# Patient Record
Sex: Male | Born: 1990 | Race: Black or African American | Hispanic: No | Marital: Single | Smoking: Current some day smoker
Health system: Southern US, Community
[De-identification: ages and names within clinical notes are randomized; demographics above are authoritative.]

## PROBLEM LIST (undated history)

## (undated) ENCOUNTER — Other Ambulatory Visit (HOSPITAL_COMMUNITY): Admission: EM | Payer: Self-pay | Source: Intra-hospital

## (undated) DIAGNOSIS — F329 Major depressive disorder, single episode, unspecified: Secondary | ICD-10-CM

## (undated) DIAGNOSIS — E119 Type 2 diabetes mellitus without complications: Secondary | ICD-10-CM

## (undated) DIAGNOSIS — Z789 Other specified health status: Secondary | ICD-10-CM

## (undated) DIAGNOSIS — F419 Anxiety disorder, unspecified: Secondary | ICD-10-CM

## (undated) DIAGNOSIS — F32A Depression, unspecified: Secondary | ICD-10-CM

## (undated) HISTORY — DX: Major depressive disorder, single episode, unspecified: F32.9

## (undated) HISTORY — DX: Depression, unspecified: F32.A

## (undated) HISTORY — PX: NO PAST SURGERIES: SHX2092

---
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
## 2012-04-05 ENCOUNTER — Encounter (HOSPITAL_COMMUNITY): Payer: Self-pay | Admitting: *Deleted

## 2012-04-05 ENCOUNTER — Emergency Department (HOSPITAL_COMMUNITY): Payer: BC Managed Care – PPO

## 2012-04-05 ENCOUNTER — Inpatient Hospital Stay (HOSPITAL_COMMUNITY): Payer: BC Managed Care – PPO | Admitting: Anesthesiology

## 2012-04-05 ENCOUNTER — Encounter (HOSPITAL_COMMUNITY): Payer: Self-pay | Admitting: Anesthesiology

## 2012-04-05 ENCOUNTER — Encounter (HOSPITAL_COMMUNITY): Admission: EM | Disposition: A | Discharge status: Home / Self Care | Payer: Self-pay | Source: Home / Self Care

## 2012-04-05 ENCOUNTER — Inpatient Hospital Stay (HOSPITAL_COMMUNITY)
Admission: EM | Admit: 2012-04-05 | Discharge: 2012-04-09 | DRG: 732 | Disposition: A | Discharge status: Home / Self Care | Payer: BC Managed Care – PPO | Attending: Orthopedic Surgery | Admitting: Orthopedic Surgery

## 2012-04-05 DIAGNOSIS — F32A Depression, unspecified: Secondary | ICD-10-CM | POA: Diagnosis present

## 2012-04-05 DIAGNOSIS — F1210 Cannabis abuse, uncomplicated: Secondary | ICD-10-CM | POA: Diagnosis present

## 2012-04-05 DIAGNOSIS — S82202B Unspecified fracture of shaft of left tibia, initial encounter for open fracture type I or II: Secondary | ICD-10-CM | POA: Diagnosis present

## 2012-04-05 DIAGNOSIS — S0292XA Unspecified fracture of facial bones, initial encounter for closed fracture: Secondary | ICD-10-CM

## 2012-04-05 DIAGNOSIS — T07XXXA Unspecified multiple injuries, initial encounter: Secondary | ICD-10-CM

## 2012-04-05 DIAGNOSIS — F329 Major depressive disorder, single episode, unspecified: Secondary | ICD-10-CM | POA: Diagnosis present

## 2012-04-05 DIAGNOSIS — Z23 Encounter for immunization: Secondary | ICD-10-CM

## 2012-04-05 DIAGNOSIS — S060X9A Concussion with loss of consciousness of unspecified duration, initial encounter: Secondary | ICD-10-CM | POA: Diagnosis present

## 2012-04-05 DIAGNOSIS — S82209B Unspecified fracture of shaft of unspecified tibia, initial encounter for open fracture type I or II: Secondary | ICD-10-CM

## 2012-04-05 DIAGNOSIS — S022XXA Fracture of nasal bones, initial encounter for closed fracture: Secondary | ICD-10-CM | POA: Diagnosis present

## 2012-04-05 DIAGNOSIS — F3289 Other specified depressive episodes: Secondary | ICD-10-CM | POA: Diagnosis present

## 2012-04-05 DIAGNOSIS — S27329A Contusion of lung, unspecified, initial encounter: Secondary | ICD-10-CM | POA: Diagnosis present

## 2012-04-05 DIAGNOSIS — S060XAA Concussion with loss of consciousness status unknown, initial encounter: Secondary | ICD-10-CM | POA: Diagnosis present

## 2012-04-05 DIAGNOSIS — S27322A Contusion of lung, bilateral, initial encounter: Secondary | ICD-10-CM

## 2012-04-05 DIAGNOSIS — D62 Acute posthemorrhagic anemia: Secondary | ICD-10-CM | POA: Diagnosis not present

## 2012-04-05 DIAGNOSIS — R000 Tachycardia, unspecified: Secondary | ICD-10-CM | POA: Diagnosis present

## 2012-04-05 DIAGNOSIS — F17200 Nicotine dependence, unspecified, uncomplicated: Secondary | ICD-10-CM | POA: Diagnosis present

## 2012-04-05 DIAGNOSIS — S060X0A Concussion without loss of consciousness, initial encounter: Secondary | ICD-10-CM | POA: Diagnosis present

## 2012-04-05 HISTORY — DX: Other specified health status: Z78.9

## 2012-04-05 HISTORY — PX: ORIF TIBIA FRACTURE: SHX5416

## 2012-04-05 LAB — CBC
HCT: 50.8 % (ref 39.0–52.0)
MCV: 87.4 fL (ref 78.0–100.0)
Platelets: 180 10*3/uL (ref 150–400)
RBC: 5.81 MIL/uL (ref 4.22–5.81)
WBC: 21.8 10*3/uL — ABNORMAL HIGH (ref 4.0–10.5)

## 2012-04-05 LAB — URINALYSIS, MICROSCOPIC ONLY
Bilirubin Urine: NEGATIVE
Nitrite: NEGATIVE
Specific Gravity, Urine: >1.046 — ABNORMAL HIGH (ref 1.005–1.030)
pH: 6.5 (ref 5.0–8.0)

## 2012-04-05 LAB — COMPREHENSIVE METABOLIC PANEL
Albumin: 4.4 g/dL (ref 3.5–5.2)
BUN: 13 mg/dL (ref 6–23)
Creatinine, Ser: 1.08 mg/dL (ref 0.50–1.35)
Total Protein: 7.4 g/dL (ref 6.0–8.3)

## 2012-04-05 LAB — POCT I-STAT, CHEM 8
Glucose, Bld: 126 mg/dL — ABNORMAL HIGH (ref 70–99)
HCT: 55 % — ABNORMAL HIGH (ref 39.0–52.0)
Hemoglobin: 18.7 g/dL — ABNORMAL HIGH (ref 13.0–17.0)
Potassium: 3.4 mEq/L — ABNORMAL LOW (ref 3.5–5.1)
Sodium: 144 mEq/L (ref 135–145)

## 2012-04-05 LAB — POCT I-STAT TROPONIN I: Troponin i, poc: 0.08 ng/mL (ref 0.00–0.08)

## 2012-04-05 LAB — PROTIME-INR: INR: 1.19 (ref 0.00–1.49)

## 2012-04-05 SURGERY — OPEN REDUCTION INTERNAL FIXATION (ORIF) TIBIA FRACTURE
Anesthesia: General | Site: Leg Lower | Laterality: Left | Wound class: Dirty or Infected

## 2012-04-05 MED ORDER — PROPOFOL 10 MG/ML IV EMUL
INTRAVENOUS | Status: DC | PRN
  Administered 2012-04-05: 180 mg via INTRAVENOUS

## 2012-04-05 MED ORDER — MIDAZOLAM HCL 5 MG/5ML IJ SOLN
INTRAMUSCULAR | Status: DC | PRN
  Administered 2012-04-05: 2 mg via INTRAVENOUS

## 2012-04-05 MED ORDER — ROCURONIUM BROMIDE 100 MG/10ML IV SOLN
INTRAVENOUS | Status: DC | PRN
  Administered 2012-04-05: 50 mg via INTRAVENOUS

## 2012-04-05 MED ORDER — POTASSIUM CHLORIDE IN NACL 20-0.9 MEQ/L-% IV SOLN
INTRAVENOUS | Status: DC
  Administered 2012-04-05: 20:00:00 via INTRAVENOUS
  Filled 2012-04-05 (×4): qty 1000

## 2012-04-05 MED ORDER — SODIUM CHLORIDE 0.9 % IV BOLUS (SEPSIS)
1000.0000 mL | Freq: Once | INTRAVENOUS | Status: AC
  Administered 2012-04-05: 1000 mL via INTRAVENOUS

## 2012-04-05 MED ORDER — FENTANYL CITRATE 0.05 MG/ML IJ SOLN
INTRAMUSCULAR | Status: DC | PRN
  Administered 2012-04-05 (×3): 50 ug via INTRAVENOUS

## 2012-04-05 MED ORDER — DEXAMETHASONE SODIUM PHOSPHATE 4 MG/ML IJ SOLN
INTRAMUSCULAR | Status: DC | PRN
  Administered 2012-04-05: 4 mg via INTRAVENOUS

## 2012-04-05 MED ORDER — 0.9 % SODIUM CHLORIDE (POUR BTL) OPTIME
TOPICAL | Status: DC | PRN
  Administered 2012-04-05: 1000 mL

## 2012-04-05 MED ORDER — HYDROMORPHONE HCL PF 1 MG/ML IJ SOLN: 1.0000 mg | INTRAMUSCULAR | Status: DC | PRN

## 2012-04-05 MED ORDER — CEFAZOLIN SODIUM 1-5 GM-% IV SOLN
INTRAVENOUS | Status: DC | PRN
  Administered 2012-04-05: 2 g via INTRAVENOUS

## 2012-04-05 MED ORDER — TETANUS-DIPHTH-ACELL PERTUSSIS 5-2.5-18.5 LF-MCG/0.5 IM SUSP
0.5000 mL | Freq: Once | INTRAMUSCULAR | Status: AC
  Administered 2012-04-05: 0.5 mL via INTRAMUSCULAR
  Filled 2012-04-05: qty 0.5

## 2012-04-05 MED ORDER — LACTATED RINGERS IV SOLN
INTRAVENOUS | Status: DC | PRN
  Administered 2012-04-05 (×2): via INTRAVENOUS

## 2012-04-05 MED ORDER — BIOTENE DRY MOUTH MT LIQD
15.0000 mL | Freq: Two times a day (BID) | OROMUCOSAL | Status: DC
  Administered 2012-04-06: 15 mL via OROMUCOSAL

## 2012-04-05 MED ORDER — ONDANSETRON HCL 4 MG PO TABS: 4.0000 mg | ORAL_TABLET | Freq: Four times a day (QID) | ORAL | Status: DC | PRN

## 2012-04-05 MED ORDER — ACETAMINOPHEN 10 MG/ML IV SOLN
INTRAVENOUS | Status: DC | PRN
  Administered 2012-04-05: 1000 mg via INTRAVENOUS

## 2012-04-05 MED ORDER — PANTOPRAZOLE SODIUM 40 MG IV SOLR
40.0000 mg | Freq: Every day | INTRAVENOUS | Status: DC
  Administered 2012-04-06: 40 mg via INTRAVENOUS
  Filled 2012-04-05: qty 40

## 2012-04-05 MED ORDER — ONDANSETRON HCL 4 MG/2ML IJ SOLN
4.0000 mg | Freq: Once | INTRAMUSCULAR | Status: AC
  Administered 2012-04-05: 4 mg via INTRAVENOUS

## 2012-04-05 MED ORDER — ACETAMINOPHEN 10 MG/ML IV SOLN
INTRAVENOUS | Status: AC
  Filled 2012-04-05: qty 100

## 2012-04-05 MED ORDER — LIDOCAINE HCL (CARDIAC) 20 MG/ML IV SOLN
INTRAVENOUS | Status: DC | PRN
  Administered 2012-04-05: 100 mg via INTRAVENOUS

## 2012-04-05 MED ORDER — ONDANSETRON HCL 4 MG/2ML IJ SOLN
INTRAMUSCULAR | Status: DC | PRN
  Administered 2012-04-05: 4 mg via INTRAVENOUS

## 2012-04-05 MED ORDER — CEFAZOLIN SODIUM 1-5 GM-% IV SOLN
1.0000 g | Freq: Once | INTRAVENOUS | Status: AC
  Administered 2012-04-05: 1 g via INTRAVENOUS
  Filled 2012-04-05: qty 50

## 2012-04-05 MED ORDER — HYDROMORPHONE HCL PF 1 MG/ML IJ SOLN
1.0000 mg | Freq: Once | INTRAMUSCULAR | Status: AC
  Administered 2012-04-05: 1 mg via INTRAVENOUS
  Filled 2012-04-05: qty 1

## 2012-04-05 MED ORDER — GLYCOPYRROLATE 0.2 MG/ML IJ SOLN
INTRAMUSCULAR | Status: DC | PRN
  Administered 2012-04-05: 0.2 mg via INTRAVENOUS
  Administered 2012-04-05: 0.4 mg via INTRAVENOUS

## 2012-04-05 MED ORDER — NEOSTIGMINE METHYLSULFATE 1 MG/ML IJ SOLN
INTRAMUSCULAR | Status: DC | PRN
  Administered 2012-04-05: 3 mg via INTRAVENOUS

## 2012-04-05 MED ORDER — HYDROMORPHONE HCL PF 1 MG/ML IJ SOLN: 0.5000 mg | INTRAMUSCULAR | Status: DC | PRN

## 2012-04-05 MED ORDER — CEFAZOLIN SODIUM-DEXTROSE 2-3 GM-% IV SOLR
INTRAVENOUS | Status: AC
  Filled 2012-04-05: qty 100

## 2012-04-05 MED ORDER — SUCCINYLCHOLINE CHLORIDE 20 MG/ML IJ SOLN
INTRAMUSCULAR | Status: DC | PRN
  Administered 2012-04-05: 140 mg via INTRAVENOUS

## 2012-04-05 MED ORDER — SODIUM CHLORIDE 0.9 % IR SOLN
Status: DC | PRN
  Administered 2012-04-05: 3000 mL

## 2012-04-05 MED ORDER — ONDANSETRON HCL 4 MG/2ML IJ SOLN: 4.0000 mg | Freq: Four times a day (QID) | INTRAMUSCULAR | Status: DC | PRN

## 2012-04-05 MED ORDER — HYDROMORPHONE HCL PF 1 MG/ML IJ SOLN
INTRAMUSCULAR | Status: AC
  Administered 2012-04-05: 1 mg
  Filled 2012-04-05: qty 1

## 2012-04-05 MED ORDER — ONDANSETRON HCL 4 MG/2ML IJ SOLN
INTRAMUSCULAR | Status: AC
  Filled 2012-04-05: qty 2

## 2012-04-05 MED ORDER — PANTOPRAZOLE SODIUM 40 MG PO TBEC
40.0000 mg | DELAYED_RELEASE_TABLET | Freq: Every day | ORAL | Status: DC
  Administered 2012-04-07 – 2012-04-08 (×2): 40 mg via ORAL
  Filled 2012-04-05 (×2): qty 1

## 2012-04-05 MED ORDER — IOHEXOL 300 MG/ML  SOLN
100.0000 mL | Freq: Once | INTRAMUSCULAR | Status: AC | PRN
  Administered 2012-04-05: 100 mL via INTRAVENOUS

## 2012-04-05 MED ORDER — FENTANYL CITRATE 0.05 MG/ML IJ SOLN
100.0000 ug | Freq: Once | INTRAMUSCULAR | Status: AC
  Administered 2012-04-05: 100 ug via INTRAVENOUS
  Filled 2012-04-05: qty 2

## 2012-04-05 SURGICAL SUPPLY — 55 items
BANDAGE ELASTIC 4 VELCRO ST LF (GAUZE/BANDAGES/DRESSINGS) ×2 IMPLANT
BANDAGE ELASTIC 6 VELCRO ST LF (GAUZE/BANDAGES/DRESSINGS) ×2 IMPLANT
BIT DRILL 4.4 (MISCELLANEOUS) ×2 IMPLANT
BIT DRILL 6X3.8 (MISCELLANEOUS) ×2 IMPLANT
BLADE SURG 10 STRL SS (BLADE) ×2 IMPLANT
CLSR STERI-STRIP ANTIMIC 1/2X4 (GAUZE/BANDAGES/DRESSINGS) ×2 IMPLANT
COVER MAYO STAND STRL (DRAPES) ×2 IMPLANT
COVER SURGICAL LIGHT HANDLE (MISCELLANEOUS) ×2 IMPLANT
CUFF TOURNIQUET SINGLE 34IN LL (TOURNIQUET CUFF) ×2 IMPLANT
CUFF TOURNIQUET SINGLE 44IN (TOURNIQUET CUFF) ×2 IMPLANT
DRAPE C-ARM 42X72 X-RAY (DRAPES) ×2 IMPLANT
DRAPE INCISE IOBAN 66X45 STRL (DRAPES) ×2 IMPLANT
DRAPE ORTHO SPLIT 77X108 STRL (DRAPES) ×1
DRAPE SURG ORHT 6 SPLT 77X108 (DRAPES) ×1 IMPLANT
DRAPE U-SHAPE 47X51 STRL (DRAPES) ×2 IMPLANT
ELECT CAUTERY BLADE 6.4 (BLADE) ×2 IMPLANT
ELECT REM PT RETURN 9FT ADLT (ELECTROSURGICAL) ×2
ELECTRODE REM PT RTRN 9FT ADLT (ELECTROSURGICAL) ×1 IMPLANT
GLOVE BIO SURGEON STRL SZ7.5 (GLOVE) ×2 IMPLANT
GLOVE BIOGEL PI IND STRL 7.5 (GLOVE) ×2 IMPLANT
GLOVE BIOGEL PI INDICATOR 7.5 (GLOVE) ×2
GLOVE NEODERM STER SZ 7 (GLOVE) ×4 IMPLANT
GLOVE ORTHO TXT STRL SZ7.5 (GLOVE) ×2 IMPLANT
GOWN STRL NON-REIN LRG LVL3 (GOWN DISPOSABLE) ×6 IMPLANT
GUIDEPIN 3.2X17.5 THRD DISP (PIN) ×2 IMPLANT
GUIDEWIRE BALL NOSE 80CM (WIRE) ×4 IMPLANT
HANDPIECE INTERPULSE COAX TIP (DISPOSABLE) ×1
KIT BASIN OR (CUSTOM PROCEDURE TRAY) ×2 IMPLANT
KIT ROOM TURNOVER OR (KITS) ×2 IMPLANT
NAIL TIBIAL 10MMX36CM (Nail) ×2 IMPLANT
PACK ORTHO EXTREMITY (CUSTOM PROCEDURE TRAY) ×4 IMPLANT
PAD ARMBOARD 7.5X6 YLW CONV (MISCELLANEOUS) ×2 IMPLANT
PAD CAST 4YDX4 CTTN HI CHSV (CAST SUPPLIES) ×3 IMPLANT
PADDING CAST COTTON 4X4 STRL (CAST SUPPLIES) ×3
SCREW ACECAP 38MM (Screw) ×2 IMPLANT
SCREW ACECAP 44MM (Screw) ×2 IMPLANT
SCREW PROXIMAL DEPUY (Screw) ×1 IMPLANT
SCREW PRXML FT 60X5.5XNS LF (Screw) ×1 IMPLANT
SET HNDPC FAN SPRY TIP SCT (DISPOSABLE) ×1 IMPLANT
SPLINT PLASTER CAST XFAST 5X30 (CAST SUPPLIES) ×2 IMPLANT
SPLINT PLASTER XFAST SET 5X30 (CAST SUPPLIES) ×2
SPONGE GAUZE 4X4 12PLY (GAUZE/BANDAGES/DRESSINGS) ×2 IMPLANT
SPONGE LAP 18X18 X RAY DECT (DISPOSABLE) ×2 IMPLANT
STOCKINETTE TUBULAR 6 INCH (GAUZE/BANDAGES/DRESSINGS) ×2 IMPLANT
SUT ETHILON 2 0 FS 18 (SUTURE) ×2 IMPLANT
SUT MNCRL AB 3-0 PS2 18 (SUTURE) ×2 IMPLANT
SUT VIC AB 0 CT1 27 (SUTURE) ×1
SUT VIC AB 0 CT1 27XBRD ANBCTR (SUTURE) ×1 IMPLANT
SUT VIC AB 1 CT1 27 (SUTURE) ×1
SUT VIC AB 1 CT1 27XBRD ANBCTR (SUTURE) ×1 IMPLANT
SUT VIC AB 2-0 CT1 27 (SUTURE) ×2
SUT VIC AB 2-0 CT1 TAPERPNT 27 (SUTURE) ×2 IMPLANT
SYR BULB IRRIGATION 50ML (SYRINGE) ×2 IMPLANT
TOWEL OR 17X24 6PK STRL BLUE (TOWEL DISPOSABLE) ×2 IMPLANT
TOWEL OR 17X26 10 PK STRL BLUE (TOWEL DISPOSABLE) ×2 IMPLANT

## 2012-04-05 NOTE — ED Notes (Signed)
Patient alert to verbal stimuli,  Resting with eyes closed

## 2012-04-05 NOTE — ED Notes (Signed)
Family at beside. Family given emotional support. 

## 2012-04-05 NOTE — Preoperative (Signed)
Beta Blockers   Reason not to administer Beta Blockers:Not Applicable 

## 2012-04-05 NOTE — Anesthesia Preprocedure Evaluation (Addendum)
Anesthesia Evaluation  Patient identified by MRN, date of birth, ID band Patient awake    Reviewed: Allergy & Precautions, H&P , NPO status , Patient's Chart, lab work & pertinent test results  Airway Mallampati: I TM Distance: >3 FB Neck ROM: Full    Dental   Pulmonary shortness of breath, Current Smoker,  Nasal fxs pulm contusions per cxr + rhonchi         Cardiovascular Rhythm:Regular Rate:Tachycardia     Neuro/Psych  C-spine cleared    GI/Hepatic (+)     substance abuse  marijuana use,   Endo/Other    Renal/GU      Musculoskeletal   Abdominal   Peds  Hematology   Anesthesia Other Findings   Reproductive/Obstetrics                          Anesthesia Physical Anesthesia Plan  ASA: II and Emergent  Anesthesia Plan: General   Post-op Pain Management:    Induction: Intravenous, Rapid sequence and Cricoid pressure planned  Airway Management Planned: Oral ETT  Additional Equipment:   Intra-op Plan:   Post-operative Plan: Extubation in OR  Informed Consent: I have reviewed the patients History and Physical, chart, labs and discussed the procedure including the risks, benefits and alternatives for the proposed anesthesia with the patient or authorized representative who has indicated his/her understanding and acceptance.     Plan Discussed with: CRNA and Surgeon  Anesthesia Plan Comments:         Anesthesia Quick Evaluation
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
## 2012-04-05 NOTE — ED Notes (Signed)
Pt in CT and x-ray.

## 2012-04-05 NOTE — ED Notes (Signed)
Family updated as to patient's status.

## 2012-04-05 NOTE — ED Notes (Signed)
Pt in CT.

## 2012-04-05 NOTE — Progress Notes (Signed)
Follow-up visit with family in waiting area.  Offered to be available as needed or requested.  Pt father thanked me for ongoing support.
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
## 2012-04-05 NOTE — Progress Notes (Signed)
Orthopedic Tech Progress Note Patient Details:  Stuart Rivers 02/20/1991 [id number]  Ortho Devices Type of Ortho Device: Short leg splint Ortho Device/Splint Interventions: Application   Cammer, Mickie Bail 04/05/2012, 1:25 PM PT LEVEL 2 TRAUMA

## 2012-04-06 ENCOUNTER — Encounter (HOSPITAL_COMMUNITY): Payer: Self-pay | Admitting: *Deleted

## 2012-04-06 ENCOUNTER — Inpatient Hospital Stay (HOSPITAL_COMMUNITY): Payer: BC Managed Care – PPO

## 2012-04-06 LAB — COMPREHENSIVE METABOLIC PANEL
BUN: 12 mg/dL (ref 6–23)
CO2: 23 mEq/L (ref 19–32)
Chloride: 108 mEq/L (ref 96–112)
Creatinine, Ser: 0.95 mg/dL (ref 0.50–1.35)
GFR calc Af Amer: >90 mL/min (ref >90)
GFR calc non Af Amer: >90 mL/min (ref >90)
Total Bilirubin: 0.5 mg/dL (ref 0.3–1.2)

## 2012-04-06 LAB — CBC
HCT: 42.6 % (ref 39.0–52.0)
MCH: 28.9 pg (ref 26.0–34.0)
MCV: 86.8 fL (ref 78.0–100.0)
RBC: 4.91 MIL/uL (ref 4.22–5.81)
WBC: 20.5 10*3/uL — ABNORMAL HIGH (ref 4.0–10.5)

## 2012-04-06 MED ORDER — PROMETHAZINE HCL 25 MG/ML IJ SOLN: 6.2500 mg | INTRAMUSCULAR | Status: DC | PRN

## 2012-04-06 MED ORDER — PHENOL 1.4 % MT LIQD: 1.0000 | OROMUCOSAL | Status: DC | PRN

## 2012-04-06 MED ORDER — WHITE PETROLATUM GEL
Status: AC
  Administered 2012-04-06: 10:00:00
  Filled 2012-04-06: qty 5

## 2012-04-06 MED ORDER — POTASSIUM CHLORIDE IN NACL 20-0.9 MEQ/L-% IV SOLN
INTRAVENOUS | Status: DC
  Administered 2012-04-06 (×2): via INTRAVENOUS
  Administered 2012-04-08: 20 mL/h via INTRAVENOUS
  Filled 2012-04-06 (×5): qty 1000

## 2012-04-06 MED ORDER — PNEUMOCOCCAL VAC POLYVALENT 25 MCG/0.5ML IJ INJ
0.5000 mL | INJECTION | INTRAMUSCULAR | Status: AC
  Administered 2012-04-07: 0.5 mL via INTRAMUSCULAR
  Filled 2012-04-06: qty 0.5

## 2012-04-06 MED ORDER — MENTHOL 3 MG MT LOZG: 1.0000 | LOZENGE | OROMUCOSAL | Status: DC | PRN

## 2012-04-06 MED ORDER — CEFAZOLIN SODIUM-DEXTROSE 2-3 GM-% IV SOLR
2.0000 g | Freq: Four times a day (QID) | INTRAVENOUS | Status: AC
  Administered 2012-04-06 – 2012-04-07 (×3): 2 g via INTRAVENOUS
  Filled 2012-04-06 (×2): qty 50

## 2012-04-06 MED ORDER — HYDROMORPHONE HCL PF 1 MG/ML IJ SOLN
0.2500 mg | INTRAMUSCULAR | Status: DC | PRN
  Administered 2012-04-06 (×2): 0.5 mg via INTRAVENOUS

## 2012-04-06 MED ORDER — METHOCARBAMOL 100 MG/ML IJ SOLN
500.0000 mg | Freq: Four times a day (QID) | INTRAVENOUS | Status: DC | PRN
  Filled 2012-04-06: qty 5

## 2012-04-06 MED ORDER — METOCLOPRAMIDE HCL 5 MG/ML IJ SOLN: 5.0000 mg | Freq: Three times a day (TID) | INTRAMUSCULAR | Status: DC | PRN

## 2012-04-06 MED ORDER — ACETAMINOPHEN 325 MG PO TABS: 650.0000 mg | ORAL_TABLET | Freq: Four times a day (QID) | ORAL | Status: DC | PRN

## 2012-04-06 MED ORDER — HYDROMORPHONE HCL PF 1 MG/ML IJ SOLN
INTRAMUSCULAR | Status: AC
  Filled 2012-04-06: qty 1

## 2012-04-06 MED ORDER — MIDAZOLAM HCL 2 MG/2ML IJ SOLN: 1.0000 mg | INTRAMUSCULAR | Status: DC | PRN

## 2012-04-06 MED ORDER — FENTANYL CITRATE 0.05 MG/ML IJ SOLN: 50.0000 ug | INTRAMUSCULAR | Status: DC | PRN

## 2012-04-06 MED ORDER — ONDANSETRON HCL 4 MG PO TABS: 4.0000 mg | ORAL_TABLET | Freq: Four times a day (QID) | ORAL | Status: DC | PRN

## 2012-04-06 MED ORDER — HYDROCODONE-ACETAMINOPHEN 5-325 MG PO TABS
1.0000 | ORAL_TABLET | Freq: Four times a day (QID) | ORAL | Status: DC | PRN
  Administered 2012-04-06 (×2): 2 via ORAL
  Filled 2012-04-06 (×3): qty 2

## 2012-04-06 MED ORDER — METHOCARBAMOL 500 MG PO TABS: 500.0000 mg | ORAL_TABLET | Freq: Four times a day (QID) | ORAL | Status: DC | PRN

## 2012-04-06 MED ORDER — MORPHINE SULFATE 2 MG/ML IJ SOLN
2.0000 mg | INTRAMUSCULAR | Status: DC | PRN
  Administered 2012-04-06 – 2012-04-07 (×7): 2 mg via INTRAVENOUS
  Administered 2012-04-07: 4 mg via INTRAVENOUS
  Administered 2012-04-07 (×2): 2 mg via INTRAVENOUS
  Administered 2012-04-07: 4 mg via INTRAVENOUS
  Administered 2012-04-07: 2 mg via INTRAVENOUS
  Administered 2012-04-07 – 2012-04-09 (×12): 4 mg via INTRAVENOUS
  Filled 2012-04-06 (×2): qty 2
  Filled 2012-04-06 (×2): qty 1
  Filled 2012-04-06 (×2): qty 2
  Filled 2012-04-06 (×3): qty 1
  Filled 2012-04-06: qty 2
  Filled 2012-04-06: qty 1
  Filled 2012-04-06: qty 2
  Filled 2012-04-06: qty 1
  Filled 2012-04-06 (×3): qty 2
  Filled 2012-04-06: qty 1
  Filled 2012-04-06 (×4): qty 2
  Filled 2012-04-06: qty 1
  Filled 2012-04-06: qty 2
  Filled 2012-04-06: qty 1

## 2012-04-06 MED ORDER — ACETAMINOPHEN 650 MG RE SUPP: 650.0000 mg | Freq: Four times a day (QID) | RECTAL | Status: DC | PRN

## 2012-04-06 MED ORDER — ONDANSETRON HCL 4 MG/2ML IJ SOLN: 4.0000 mg | Freq: Four times a day (QID) | INTRAMUSCULAR | Status: DC | PRN

## 2012-04-06 MED ORDER — METOCLOPRAMIDE HCL 5 MG PO TABS
5.0000 mg | ORAL_TABLET | Freq: Three times a day (TID) | ORAL | Status: DC | PRN
  Filled 2012-04-06: qty 2

## 2012-04-06 NOTE — Anesthesia Postprocedure Evaluation (Signed)
  Anesthesia Post-op Note  Patient: Stuart Rivers  Procedure(s) Performed: Procedure(s) (LRB): OPEN REDUCTION INTERNAL FIXATION (ORIF) TIBIA FRACTURE (Left)  Patient Location: PACU  Anesthesia Type: General  Level of Consciousness: awake  Airway and Oxygen Therapy: Patient Spontanous Breathing  Post-op Pain: mild  Post-op Assessment: Post-op Vital signs reviewed, Patient's Cardiovascular Status Stable, Respiratory Function Stable, Patent Airway, No signs of Nausea or vomiting and Pain level controlled  Post-op Vital Signs: stable  Complications: No apparent anesthesia complications
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
## 2012-04-06 NOTE — Brief Op Note (Signed)
04/05/2012 - 04/06/2012  1:55 AM  PATIENT:  Stuart Rivers  20 y.o. male  PRE-OPERATIVE DIAGNOSIS:  Grade II open left tibial shaft fracture  POST-OPERATIVE DIAGNOSIS:  Grade II open left tibial shaft fracture  PROCEDURE:  Procedure(s) (LRB): Excisional debridement of left leg wound, 4cm, skin subcutaneous tissue and bone OPEN REDUCTION INTERNAL FIXATION (ORIF) TIBIA FRACTURE (Left)  Biomet Versa nail, 10mm X 36cm, 1 proximal interlock, 2 distal interlocking screws  SURGEON:  Surgeon(s) and Role:    * Shelda Pal, MD - Primary  PHYSICIAN ASSISTANT: No  ANESTHESIA:   general  EBL:  Total I/O In: 1693.8 [I.V.:1693.8] Out: 525 [Urine:525]  BLOOD ADMINISTERED:none  DRAINS: none   LOCAL MEDICATIONS USED:  NONE  SPECIMEN:  No Specimen  DISPOSITION OF SPECIMEN:  N/A  COUNTS:  YES  TOURNIQUET:  * Missing tourniquet times found for documented tourniquets in log:  [id number] *  DICTATION: .Other Dictation: Dictation Number [id number]  PLAN OF CARE: Admit to inpatient   PATIENT DISPOSITION:  PACU - hemodynamically stable.   Delay start of Pharmacological VTE agent (>24hrs) due to surgical blood loss or risk of bleeding: yes
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
## 2012-04-06 NOTE — Progress Notes (Signed)
Chaplain was called to room by secretary of 3100.  Parents wanted to tell patient of the death of his girlfriend, but when I arrived they had changed their mind after consultation with the surgeon because of pending surgery. Instead, they asked me to pray with the patient and others gathered around the bed.  Prayer, then spiritual conversation with parents.  Will follow.  Rev. Audie Box [phone]
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
## 2012-04-06 NOTE — Progress Notes (Signed)
The patient is doing okay.  Stable for transfer to the floor.  To have his nose fixed by Dr. Kelly Splinter tomorrow.  Will transfer to floor.Marta Lamas Gae Bon, MD, FACS [phone] Trauma Surgeon

## 2012-04-06 NOTE — Progress Notes (Signed)
Subjective: 1 Day Post-Op Procedure(s) (LRB): OPEN REDUCTION INTERNAL FIXATION (ORIF) TIBIA FRACTURE (Left)    Patient reports pain as moderate. Comfortable this am with adjustment in meds. Sore all over, chest shoulder blades  Objective:   VITALS:   Filed Vitals:   04/06/12 1208  BP:   Pulse:   Temp: 97.2 F (36.2 C)  Resp:     Neurovascular intact Incision: dressing C/D/I left LE splint dry  LABS  Basename 04/06/12 0515 04/05/12 1335 04/05/12 1317  HGB 14.2 18.7* 16.9  HCT 42.6 55.0* 50.8  WBC 20.5* -- 21.8*  PLT 172 -- 180     Basename 04/06/12 0515 04/05/12 1335 04/05/12 1317  NA 139 144 143  K 4.5 3.4* 3.6  BUN 12 14 13   CREATININE 0.95 1.10 1.08  GLUCOSE 117* 126* 130*     Basename 04/05/12 1317  LABPT --  INR 1.19     Assessment/Plan: 1 Day Post-Op Procedure(s) (LRB): OPEN REDUCTION INTERNAL FIXATION (ORIF) TIBIA FRACTURE (Left)   Advance diet Up with therapy, 50% WB LLE Will continue to follow DVT prophylaxis per trauma service given any concerns about internal bleeding, lung contusion etc
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
## 2012-04-06 NOTE — Transfer of Care (Signed)
Immediate Anesthesia Transfer of Care Note  Patient: Stuart Rivers  Procedure(s) Performed: Procedure(s) (LRB): OPEN REDUCTION INTERNAL FIXATION (ORIF) TIBIA FRACTURE (Left)  Patient Location: PACU  Anesthesia Type: General  Level of Consciousness: oriented, patient cooperative and responds to stimulation  Airway & Oxygen Therapy: Patient Spontanous Breathing and Patient connected to face mask oxygen  Post-op Assessment: Report given to PACU RN, Post -op Vital signs reviewed and stable and Patient moving all extremities  Post vital signs: Reviewed and stable  Complications: No apparent anesthesia complications
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
## 2012-04-06 NOTE — Progress Notes (Signed)
UR complete 

## 2012-04-07 ENCOUNTER — Encounter (HOSPITAL_COMMUNITY): Payer: Self-pay | Admitting: Plastic Surgery

## 2012-04-07 ENCOUNTER — Encounter (HOSPITAL_COMMUNITY): Payer: Self-pay | Admitting: Anesthesiology

## 2012-04-07 ENCOUNTER — Inpatient Hospital Stay (HOSPITAL_COMMUNITY): Payer: BC Managed Care – PPO | Admitting: Anesthesiology

## 2012-04-07 ENCOUNTER — Encounter (HOSPITAL_COMMUNITY): Admission: EM | Disposition: A | Discharge status: Home / Self Care | Payer: Self-pay | Source: Home / Self Care

## 2012-04-07 DIAGNOSIS — S022XXA Fracture of nasal bones, initial encounter for closed fracture: Secondary | ICD-10-CM | POA: Diagnosis present

## 2012-04-07 HISTORY — PX: CLOSED REDUCTION NASAL FRACTURE: SHX5365

## 2012-04-07 LAB — CBC
HCT: 38.6 % — ABNORMAL LOW (ref 39.0–52.0)
Hemoglobin: 12.8 g/dL — ABNORMAL LOW (ref 13.0–17.0)
RBC: 4.4 MIL/uL (ref 4.22–5.81)
RDW: 13.7 % (ref 11.5–15.5)
WBC: 12.4 10*3/uL — ABNORMAL HIGH (ref 4.0–10.5)

## 2012-04-07 SURGERY — CLOSED REDUCTION, FRACTURE, NASAL BONE
Anesthesia: General | Site: Nose | Wound class: Clean Contaminated

## 2012-04-07 MED ORDER — FENTANYL CITRATE 0.05 MG/ML IJ SOLN
INTRAMUSCULAR | Status: DC | PRN
  Administered 2012-04-07: 50 ug via INTRAVENOUS

## 2012-04-07 MED ORDER — HYDROMORPHONE HCL PF 1 MG/ML IJ SOLN
0.2500 mg | INTRAMUSCULAR | Status: DC | PRN
  Administered 2012-04-07 (×3): 0.5 mg via INTRAVENOUS

## 2012-04-07 MED ORDER — LIDOCAINE HCL (CARDIAC) 20 MG/ML IV SOLN
INTRAVENOUS | Status: DC | PRN
  Administered 2012-04-07: 100 mg via INTRAVENOUS

## 2012-04-07 MED ORDER — LIDOCAINE-EPINEPHRINE 1 %-1:100000 IJ SOLN
INTRAMUSCULAR | Status: DC | PRN
  Administered 2012-04-07: 4 mL

## 2012-04-07 MED ORDER — MIDAZOLAM HCL 5 MG/5ML IJ SOLN
INTRAMUSCULAR | Status: DC | PRN
  Administered 2012-04-07: 2 mg via INTRAVENOUS

## 2012-04-07 MED ORDER — BACITRACIN-NEOMYCIN-POLYMYXIN OINTMENT TUBE
TOPICAL_OINTMENT | CUTANEOUS | Status: DC | PRN
  Administered 2012-04-07: 1 via TOPICAL

## 2012-04-07 MED ORDER — HYDROMORPHONE HCL PF 1 MG/ML IJ SOLN
INTRAMUSCULAR | Status: AC
  Filled 2012-04-07: qty 1

## 2012-04-07 MED ORDER — PROPOFOL 10 MG/ML IV BOLUS
INTRAVENOUS | Status: DC | PRN
  Administered 2012-04-07: 160 mg via INTRAVENOUS

## 2012-04-07 MED ORDER — DROPERIDOL 2.5 MG/ML IJ SOLN
0.6250 mg | INTRAMUSCULAR | Status: DC | PRN
  Filled 2012-04-07: qty 0.25

## 2012-04-07 MED ORDER — LACTATED RINGERS IV SOLN
INTRAVENOUS | Status: DC | PRN
  Administered 2012-04-07: 08:00:00 via INTRAVENOUS

## 2012-04-07 MED ORDER — ONDANSETRON HCL 4 MG/2ML IJ SOLN
INTRAMUSCULAR | Status: DC | PRN
  Administered 2012-04-07: 4 mg via INTRAVENOUS

## 2012-04-07 MED ORDER — OXYMETAZOLINE HCL 0.05 % NA SOLN
NASAL | Status: DC | PRN
  Administered 2012-04-07: 1 via NASAL

## 2012-04-07 SURGICAL SUPPLY — 27 items
CANISTER SUCTION 2500CC (MISCELLANEOUS) ×2 IMPLANT
CLOTH BEACON ORANGE TIMEOUT ST (SAFETY) ×2 IMPLANT
COVER MAYO STAND STRL (DRAPES) ×2 IMPLANT
COVER TABLE BACK 60X90 (DRAPES) ×2 IMPLANT
GAUZE SPONGE 2X2 8PLY STRL LF (GAUZE/BANDAGES/DRESSINGS) ×1 IMPLANT
GAUZE SPONGE 4X4 16PLY XRAY LF (GAUZE/BANDAGES/DRESSINGS) ×2 IMPLANT
GLOVE BIO SURGEON STRL SZ 6.5 (GLOVE) ×2 IMPLANT
GLOVE SURG SS PI 7.0 STRL IVOR (GLOVE) ×2 IMPLANT
GOWN STRL NON-REIN LRG LVL3 (GOWN DISPOSABLE) IMPLANT
GOWN STRL REIN XL XLG (GOWN DISPOSABLE) ×2 IMPLANT
KIT BASIN OR (CUSTOM PROCEDURE TRAY) ×2 IMPLANT
KIT ROOM TURNOVER OR (KITS) ×2 IMPLANT
KIT SPLINT NASAL DENVER PET BE (GAUZE/BANDAGES/DRESSINGS) ×2 IMPLANT
NEEDLE HYPO 25GX1X1/2 BEV (NEEDLE) ×2 IMPLANT
NS IRRIG 1000ML POUR BTL (IV SOLUTION) IMPLANT
PAD ARMBOARD 7.5X6 YLW CONV (MISCELLANEOUS) ×4 IMPLANT
PATTIES SURGICAL .5 X3 (DISPOSABLE) ×2 IMPLANT
SPLINT NASAL DOYLE BI-VL (GAUZE/BANDAGES/DRESSINGS) ×2 IMPLANT
SPLINT NASAL THERMO PLAST (MISCELLANEOUS) ×2 IMPLANT
SPONGE GAUZE 2X2 STER 10/PKG (GAUZE/BANDAGES/DRESSINGS) ×1
SPONGE GAUZE 4X4 12PLY (GAUZE/BANDAGES/DRESSINGS) ×2 IMPLANT
SUT ETHILON 3 0 PS 1 (SUTURE) ×2 IMPLANT
SYR CONTROL 10ML LL (SYRINGE) ×2 IMPLANT
TAPE CLOTH SURG 4X10 WHT LF (GAUZE/BANDAGES/DRESSINGS) ×2 IMPLANT
TOWEL OR 17X24 6PK STRL BLUE (TOWEL DISPOSABLE) ×4 IMPLANT
TUBE CONNECTING 12X1/4 (SUCTIONS) ×2 IMPLANT
WATER STERILE IRR 1000ML POUR (IV SOLUTION) ×2 IMPLANT

## 2012-04-07 NOTE — Anesthesia Postprocedure Evaluation (Signed)
  Anesthesia Post-op Note  Patient: Stuart Rivers  Procedure(s) Performed: Procedure(s) (LRB): CLOSED REDUCTION NASAL FRACTURE (N/A)  Patient Location: PACU  Anesthesia Type: General  Level of Consciousness: awake, oriented, sedated and patient cooperative  Airway and Oxygen Therapy: Room air  Post-op Pain: mild  Post-op Assessment: Post-op Vital signs reviewed, Patient's Cardiovascular Status Stable, Respiratory Function Stable, Patent Airway, No signs of Nausea or vomiting and Pain level controlled  Post-op Vital Signs: stable  Complications: No apparent anesthesia complications

## 2012-04-07 NOTE — Preoperative (Signed)
Beta Blockers   Reason not to administer Beta Blockers:Not Applicable 

## 2012-04-07 NOTE — Transfer of Care (Signed)
Immediate Anesthesia Transfer of Care Note  Patient: Stuart Rivers  Procedure(s) Performed: Procedure(s) (LRB): CLOSED REDUCTION NASAL FRACTURE (N/A)  Patient Location: PACU  Anesthesia Type: General  Level of Consciousness: awake, alert , oriented and patient cooperative  Airway & Oxygen Therapy: Patient Spontanous Breathing  Post-op Assessment: Report given to PACU RN, Post -op Vital signs reviewed and stable and Patient moving all extremities  Post vital signs: Reviewed and stable  Complications: No apparent anesthesia complications

## 2012-04-07 NOTE — Anesthesia Procedure Notes (Signed)
Procedure Name: LMA Insertion Date/Time: 04/07/2012 7:44 AM Performed by: Jerilee Hoh Pre-anesthesia Checklist: Patient identified, Emergency Drugs available, Suction available and Patient being monitored Patient Re-evaluated:Patient Re-evaluated prior to inductionOxygen Delivery Method: Circle system utilized Preoxygenation: Pre-oxygenation with 100% oxygen Intubation Type: IV induction LMA: LMA inserted LMA Size: 4.0 Tube type: Oral Number of attempts: 1 Placement Confirmation: positive ETCO2 and breath sounds checked- equal and bilateral Tube secured with: Tape Dental Injury: Teeth and Oropharynx as per pre-operative assessment and Injury to lip  Comments: Patient had dry, cracked lips prior to induction.  Small amount of blood noted on lower lip after inserting the LMA.  Lips moisturized with lacrilube.

## 2012-04-07 NOTE — Brief Op Note (Signed)
04/05/2012 - 04/07/2012  8:05 AM  PATIENT:  Stuart Rivers  20 y.o. male  PRE-OPERATIVE DIAGNOSIS:  nasal fracture  POST-OPERATIVE DIAGNOSIS:  nasal fracture  PROCEDURE:  Procedure(s) (LRB): CLOSED REDUCTION NASAL FRACTURE (N/A) WITH INTERNAL AND EXTERNAL SPLINTING  SURGEON:  Surgeon(s) and Role:    * Claire Sanger, DO - Primary  PHYSICIAN ASSISTANT: NONE  ASSISTANTS: none   ANESTHESIA:   local and general  EBL:  Total I/O In: 300 [I.V.:300] Out: 5 [Blood:5]  BLOOD ADMINISTERED:none  DRAINS: none   LOCAL MEDICATIONS USED:  LIDOCAINE   SPECIMEN:  No Specimen  DISPOSITION OF SPECIMEN:  N/A  COUNTS:  YES  TOURNIQUET:  * No tourniquets in log *  DICTATION: dictated  PLAN OF CARE: Return to unit  PATIENT DISPOSITION:  PACU - hemodynamically stable.   Delay start of Pharmacological VTE agent (>24hrs) due to surgical blood loss or risk of bleeding: no
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
## 2012-04-07 NOTE — Anesthesia Preprocedure Evaluation (Addendum)
Anesthesia Evaluation  Patient identified by MRN, date of birth, ID band Patient awake    Reviewed: Allergy & Precautions, H&P , NPO status , Patient's Chart, lab work & pertinent test results  History of Anesthesia Complications Negative for: history of anesthetic complications  Airway Mallampati: II TM Distance: >3 FB Neck ROM: Full    Dental  (+) Teeth Intact and Dental Advisory Given   Pulmonary Current Smoker,    Pulmonary exam normal       Cardiovascular negative cardio ROS  Rhythm:Regular Rate:Normal     Neuro/Psych negative neurological ROS     GI/Hepatic negative GI ROS, Neg liver ROS,   Endo/Other  negative endocrine ROS  Renal/GU negative Renal ROS     Musculoskeletal   Abdominal   Peds  Hematology   Anesthesia Other Findings   Reproductive/Obstetrics                           Anesthesia Physical Anesthesia Plan  ASA: II  Anesthesia Plan: General   Post-op Pain Management:    Induction: Intravenous  Airway Management Planned: Oral ETT  Additional Equipment:   Intra-op Plan:   Post-operative Plan: Extubation in OR  Informed Consent: I have reviewed the patients History and Physical, chart, labs and discussed the procedure including the risks, benefits and alternatives for the proposed anesthesia with the patient or authorized representative who has indicated his/her understanding and acceptance.   Dental advisory given  Plan Discussed with: CRNA, Anesthesiologist and Surgeon  Anesthesia Plan Comments:         Anesthesia Quick Evaluation

## 2012-04-07 NOTE — Progress Notes (Signed)
OT Cancellation Note  Treatment cancelled today due to medical issues with patient which prohibited therapy. Pt just returned from surgery. Will continue tomorrow.  Durante Violett, OTR/L  [phone] 04/07/2012 Ermagene Saidi,HILLARY 04/07/2012, 12:12 PM
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
## 2012-04-07 NOTE — Progress Notes (Signed)
Sore post-op. Tolerating clears GPD present obtaining blood tests I spoke to the patient's family also Patient examined and I agree with the assessment and plan  Violeta Gelinas, MD, MPH, FACS Pager: [phone]  04/07/2012 12:50 PM

## 2012-04-08 ENCOUNTER — Encounter (HOSPITAL_COMMUNITY): Payer: Self-pay | Admitting: Psychiatry

## 2012-04-08 DIAGNOSIS — Z634 Disappearance and death of family member: Secondary | ICD-10-CM

## 2012-04-08 DIAGNOSIS — S82202B Unspecified fracture of shaft of left tibia, initial encounter for open fracture type I or II: Secondary | ICD-10-CM | POA: Diagnosis present

## 2012-04-08 DIAGNOSIS — F32A Depression, unspecified: Secondary | ICD-10-CM | POA: Diagnosis present

## 2012-04-08 DIAGNOSIS — F329 Major depressive disorder, single episode, unspecified: Secondary | ICD-10-CM

## 2012-04-08 DIAGNOSIS — F3289 Other specified depressive episodes: Secondary | ICD-10-CM

## 2012-04-08 MED ORDER — ENOXAPARIN SODIUM 80 MG/0.8ML ~~LOC~~ SOLN
1.0000 mg/kg | SUBCUTANEOUS | Status: DC
  Filled 2012-04-08: qty 0.8

## 2012-04-08 MED ORDER — ENOXAPARIN SODIUM 40 MG/0.4ML ~~LOC~~ SOLN
40.0000 mg | SUBCUTANEOUS | Status: DC
  Administered 2012-04-08 – 2012-04-09 (×2): 40 mg via SUBCUTANEOUS
  Filled 2012-04-08 (×2): qty 0.4

## 2012-04-08 NOTE — Progress Notes (Signed)
This patient has been seen and I agree with the findings and treatment plan.  Netty Sullivant O. Kylea Berrong, III, MD, FACS [phone] (pager) [phone] (direct pager) Trauma Surgeon  

## 2012-04-08 NOTE — Progress Notes (Signed)
   Subjective: 1 Day Post-Op Procedure(s) (LRB): CLOSED REDUCTION NASAL FRACTURE (N/A)   Patient reports pain as mild for the tibia fracture. No events throughout the night.   Objective:   VITALS:   Filed Vitals:   04/08/12 0547  BP: 141/87  Pulse: 80  Temp: 98.1 F (36.7 C)  Resp: 17    Neurovascular intact Incision: dressing C/D/I  LABS  Basename 04/07/12 0948 04/06/12 0515 04/05/12 1335 04/05/12 1317  HGB 12.8* 14.2 18.7* --  HCT 38.6* 42.6 55.0* --  WBC 12.4* 20.5* -- 21.8*  PLT 159 172 -- 180     Basename 04/06/12 0515 04/05/12 1335 04/05/12 1317  NA 139 144 143  K 4.5 3.4* 3.6  BUN 12 14 13   CREATININE 0.95 1.10 1.08  GLUCOSE 117* 126* 130*     Assessment/Plan: 1 Day Post-Op Procedure(s) (LRB): CLOSED REDUCTION NASAL FRACTURE (N/A)   Orthopaedically stable as regards to the tibia fracture. PWB 25-50% Call with any questions Follow up in 2 weeks at [facility].  Follow-up Information    Follow up with OLIN,Khadeeja Elden D in 2 weeks.   Contact information:   [facility] [address], [address] [place] [address] [phone]             Anastasio Auerbach. Carlie Corpus   PAC  04/08/2012, 9:47 AM
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
## 2012-04-08 NOTE — Progress Notes (Signed)
Occupational Therapy Treatment Patient Details Name: ABDULAHAD MEDEROS MRN: [id number] DOB: 28-Oct-1990 Today's Date: 04/08/2012 Time: 1308-6578 OT Time Calculation (min): 29 min  OT Assessment / Plan / Recommendation Comments on Treatment Session Pt making steady progress.  Overall only needs min guard assist for mobility and simulated selfcare tasks.  Able to follow current PWBing status as well.  Anticipate need for 3:1 at discharge.  Will continue to follow.    Follow Up Recommendations  No OT follow up       Equipment Recommendations  3 in 1 bedside comode       Frequency Min 2X/week   Plan Discharge plan needs to be updated    Precautions / Restrictions Precautions Precautions: Fall Restrictions Weight Bearing Restrictions: Yes LLE Weight Bearing: Partial weight bearing LLE Partial Weight Bearing Percentage or Pounds: 25-50%   Pertinent Vitals/Pain Pain 8/10 in his nose, and 3-4/10 in his LLE   ADL  Grooming: Performed;Min guard;Wash/dry hands Where Assessed - Grooming: Supported standing Toilet Transfer: Performed;Min Pension scheme manager Method: Other (comment) (Ambulate and stand to urinate at the toilet.) Transfers/Ambulation Related to ADLs: Pt currently min guard assist for mobility using the RW.  Able to follow weightbearing precautions appropriately. ADL Comments: Pt doing much better today.  Able to transition to sitting with min assist for the LLE only.  Able to perform all sit to stand with min guard assist and min instructional cueing for hand placement.  Also needed mod instructional cueing for sequencing steps using the RW.  Discussed with pt and grandmother on tub/shower options at discharge.  Right now they are not sure about discharge plans and pt will just sponge bathe at home.       OT Goals ADL Goals ADL Goal: Toilet Transfer - Progress: Progressing toward goals  Visit Information  Last OT Received On: 04/08/12    Subjective Data  Subjective:  "I'm not sure where I will be staying when I leave here." Patient Stated Goal: did not state during session      Cognition  Overall Cognitive Status: Appears within functional limits for tasks assessed/performed Arousal/Alertness: Awake/alert Orientation Level: Appears intact for tasks assessed Behavior During Session: [facility] for tasks performed    Mobility Bed Mobility Bed Mobility: Supine to Sit Supine to Sit: 4: Min assist;HOB flat Transfers Transfers: Sit to Stand Sit to Stand: 4: Min guard;With upper extremity assist Stand to Sit: 4: Min guard;Without upper extremity assist      Balance Balance Balance Assessed: Yes Static Standing Balance Static Standing - Balance Support: Right upper extremity supported;Left upper extremity supported Static Standing - Level of Assistance: 5: Stand by assistance Dynamic Standing Balance Dynamic Standing - Balance Support: Right upper extremity supported;Left upper extremity supported Dynamic Standing - Level of Assistance: 5: Stand by assistance;Other (comment) (When using RW for mobility.)  End of Session OT - End of Session Equipment Utilized During Treatment: Gait belt Activity Tolerance: Patient tolerated treatment well Patient left: in chair;with call bell/phone within reach;with family/visitor present     [facility] OTR/L Pager number [phone] 04/08/2012, 2:31 PM

## 2012-04-08 NOTE — Progress Notes (Signed)
Patient was complaining that the dressing around his nose was dirty and would like it changed not reinforced

## 2012-04-08 NOTE — Progress Notes (Signed)
Physical Therapy Treatment Patient Details Name: Stuart Rivers MRN: [id number] DOB: 03-Dec-1990 Today's Date: 04/08/2012 Time: 1331-1400 PT Time Calculation (min): 29 min  PT Assessment / Plan / Recommendation Comments on Treatment Session  Patient progressing with ambulation this session. Patient stated he is unsure of definant discharge situation but is working to find out if he will be with his grandmother or his mother. Will attempt hall ambulation next session, if tolerates well may try stairs. Patient with good awareness of precautions.     Follow Up Recommendations  Home health PT;Supervision for mobility/OOB    Barriers to Discharge        Equipment Recommendations  3 in 1 bedside comode;Rolling walker with 5" wheels    Recommendations for Other Services    Frequency Min 3X/week   Plan Discharge plan remains appropriate;Frequency remains appropriate    Precautions / Restrictions Precautions Precautions: Fall Restrictions Weight Bearing Restrictions: Yes LLE Weight Bearing: Partial weight bearing LLE Partial Weight Bearing Percentage or Pounds: 50%   Pertinent Vitals/Pain Patient complained of sore nose. Did not rate. Increased leg pain with increased ambulation    Mobility  Bed Mobility Bed Mobility: Supine to Sit Supine to Sit: 4: Min assist;With rails Sitting - Scoot to Edge of Bed: 4: Min assist Details for Bed Mobility Assistance: A to hold LLE Transfers Sit to Stand: 4: Min guard;With upper extremity assist;From bed Stand to Sit: 4: Min guard;With upper extremity assist;To chair/3-in-1 Details for Transfer Assistance: Cues for safe hand placement Ambulation/Gait Ambulation/Gait Assistance: 4: Min guard Ambulation Distance (Feet): 30 Feet Assistive device: Rolling walker Ambulation/Gait Assistance Details: Cues for positioning and safety with RW and gait sequence Gait Pattern: Step-to pattern;Decreased step length - right;Decreased step length - left      Exercises     PT Diagnosis:    PT Problem List:   PT Treatment Interventions:     PT Goals Acute Rehab PT Goals PT Goal: Supine/Side to Sit - Progress: Progressing toward goal PT Goal: Sit to Stand - Progress: Progressing toward goal PT Transfer Goal: Bed to Chair/Chair to Bed - Progress: Progressing toward goal PT Goal: Ambulate - Progress: Progressing toward goal  Visit Information  Last PT Received On: 04/08/12 Assistance Needed: +1    Subjective Data      Cognition  Overall Cognitive Status: Appears within functional limits for tasks assessed/performed Arousal/Alertness: Awake/alert Orientation Level: Appears intact for tasks assessed Behavior During Session: [facility] for tasks performed    Balance  Balance Balance Assessed: Yes Static Standing Balance Static Standing - Balance Support: Right upper extremity supported;Left upper extremity supported Static Standing - Level of Assistance: 5: Stand by assistance Dynamic Standing Balance Dynamic Standing - Balance Support: Right upper extremity supported;Left upper extremity supported Dynamic Standing - Level of Assistance: 5: Stand by assistance;Other (comment) (When using RW for mobility.)  End of Session PT - End of Session Equipment Utilized During Treatment: Gait belt Activity Tolerance: Patient tolerated treatment well;Patient limited by pain Patient left: in chair;with call bell/phone within reach Nurse Communication: Mobility status   GP     Fredrich Birks 04/08/2012, 3:08 PM 04/08/2012 Fredrich Birks PTA [phone] pager [phone] office
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
## 2012-04-09 ENCOUNTER — Encounter (HOSPITAL_COMMUNITY): Payer: Self-pay | Admitting: Plastic Surgery

## 2012-04-09 DIAGNOSIS — S060X9A Concussion with loss of consciousness of unspecified duration, initial encounter: Secondary | ICD-10-CM | POA: Diagnosis present

## 2012-04-09 DIAGNOSIS — D62 Acute posthemorrhagic anemia: Secondary | ICD-10-CM | POA: Diagnosis not present

## 2012-04-09 DIAGNOSIS — Z7289 Other problems related to lifestyle: Secondary | ICD-10-CM | POA: Insufficient documentation

## 2012-04-09 DIAGNOSIS — Z720 Tobacco use: Secondary | ICD-10-CM | POA: Insufficient documentation

## 2012-04-09 DIAGNOSIS — F1290 Cannabis use, unspecified, uncomplicated: Secondary | ICD-10-CM | POA: Insufficient documentation

## 2012-04-09 MED ORDER — MORPHINE SULFATE 2 MG/ML IJ SOLN: 2.0000 mg | INTRAMUSCULAR | Status: DC | PRN

## 2012-04-09 MED ORDER — OXYCODONE-ACETAMINOPHEN 10-325 MG PO TABS: 1.0000 | ORAL_TABLET | Freq: Four times a day (QID) | ORAL | Status: DC | PRN

## 2012-04-09 MED ORDER — OXYCODONE HCL 5 MG PO TABS
10.0000 mg | ORAL_TABLET | ORAL | Status: DC | PRN
  Administered 2012-04-09: 20 mg via ORAL
  Filled 2012-04-09: qty 4

## 2012-04-09 MED ORDER — OXYCODONE HCL 5 MG PO TABS
10.0000 mg | ORAL_TABLET | Freq: Once | ORAL | Status: AC
  Administered 2012-04-09: 10 mg via ORAL

## 2012-04-09 MED ORDER — OXYCODONE HCL 5 MG PO TABS
5.0000 mg | ORAL_TABLET | ORAL | Status: DC | PRN
  Administered 2012-04-09: 10 mg via ORAL
  Filled 2012-04-09 (×2): qty 2

## 2012-04-09 NOTE — Progress Notes (Signed)
Pt will be discharging to [employer] [facility] home in [place].  The phone number we have is for patient's grandmother who will be the contact person for [facility] to call to arrange the visit.  Home address for services: [address] [address]--[place] address [place]  Phone number to use is:  [phone]. Grandmother's number--Bonita Lollis  HHPT arranged.

## 2012-04-09 NOTE — Progress Notes (Signed)
Physical Therapy Treatment Patient Details Name: Stuart Rivers MRN: [id number] DOB: 10-20-1990 Today's Date: 04/09/2012 Time: 5621-3086 PT Time Calculation (min): 21 min  PT Assessment / Plan / Recommendation Comments on Treatment Session  Patient continuing to make progress with ambulation and activity. Patient had gotten up and had been sitting in the chair for a while. Appears in a better emotional state and conversing a lot more this session. Patient is going to stay with his mother and will not have stairs to enter the house    Follow Up Recommendations  Home health PT;Supervision for mobility/OOB    Barriers to Discharge        Equipment Recommendations  3 in 1 bedside comode;Rolling walker with 5" wheels    Recommendations for Other Services    Frequency Min 3X/week   Plan Discharge plan remains appropriate;Frequency remains appropriate    Precautions / Restrictions Precautions Precautions: Fall Restrictions Weight Bearing Restrictions: Yes LLE Weight Bearing: Partial weight bearing LLE Partial Weight Bearing Percentage or Pounds: 50%   Pertinent Vitals/Pain 10/10 after ambulation in L leg. RN notified    Mobility  Bed Mobility Bed Mobility: Not assessed Transfers Sit to Stand: 5: Supervision;With upper extremity assist;From chair/3-in-1 Stand to Sit: With upper extremity assist;5: Supervision;To chair/3-in-1 Details for Transfer Assistance: Cues for safe hand placement Ambulation/Gait Ambulation/Gait Assistance: 4: Min guard Ambulation Distance (Feet): 60 Feet Assistive device: Rolling walker Ambulation/Gait Assistance Details: Cues for sequence and to relax shoulders Gait Pattern: Step-to pattern Gait velocity: decreased    Exercises     PT Diagnosis:    PT Problem List:   PT Treatment Interventions:     PT Goals Acute Rehab PT Goals PT Goal: Sit to Stand - Progress: Progressing toward goal PT Transfer Goal: Bed to Chair/Chair to Bed - Progress:  Progressing toward goal PT Goal: Ambulate - Progress: Progressing toward goal  Visit Information  Last PT Received On: 04/09/12 Assistance Needed: +2    Subjective Data      Cognition  Overall Cognitive Status: Appears within functional limits for tasks assessed/performed Arousal/Alertness: Awake/alert Orientation Level: Appears intact for tasks assessed Behavior During Session: [facility] for tasks performed    Balance     End of Session PT - End of Session Equipment Utilized During Treatment: Gait belt Activity Tolerance: Patient tolerated treatment well;Patient limited by pain Patient left: in chair Nurse Communication: Mobility status   GP     Fredrich Birks 04/09/2012, 11:47 AM 04/09/2012 Fredrich Birks PTA [phone] pager [phone] office
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
## 2012-04-09 NOTE — Discharge Summary (Signed)
Stuart Steinmeyer, MD, MPH, FACS Pager: [phone]  
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
## 2012-04-14 NOTE — Progress Notes (Signed)
Agree Sheana Bir, MD, MPH, FACS Pager: [phone]  

## 2012-07-06 ENCOUNTER — Other Ambulatory Visit (HOSPITAL_COMMUNITY)
Admission: RE | Admit: 2012-07-06 | Discharge: 2012-07-06 | Disposition: A | Discharge status: Home / Self Care | Payer: BC Managed Care – PPO | Source: Ambulatory Visit | Attending: Family Medicine | Admitting: Family Medicine

## 2012-07-06 ENCOUNTER — Encounter: Payer: Self-pay | Admitting: Family Medicine

## 2012-07-06 ENCOUNTER — Ambulatory Visit (INDEPENDENT_AMBULATORY_CARE_PROVIDER_SITE_OTHER): Payer: BC Managed Care – PPO | Admitting: Family Medicine

## 2012-07-06 VITALS — BP 112/70 | HR 78 | Temp 98.6°F | Ht 66.0 in | Wt 184.0 lb

## 2012-07-06 DIAGNOSIS — R300 Dysuria: Secondary | ICD-10-CM

## 2012-07-06 DIAGNOSIS — Z131 Encounter for screening for diabetes mellitus: Secondary | ICD-10-CM

## 2012-07-06 DIAGNOSIS — Z7251 High risk heterosexual behavior: Secondary | ICD-10-CM

## 2012-07-06 DIAGNOSIS — Z23 Encounter for immunization: Secondary | ICD-10-CM

## 2012-07-06 DIAGNOSIS — Z113 Encounter for screening for infections with a predominantly sexual mode of transmission: Secondary | ICD-10-CM | POA: Insufficient documentation

## 2012-07-06 DIAGNOSIS — Z13220 Encounter for screening for lipoid disorders: Secondary | ICD-10-CM

## 2012-07-06 LAB — URINALYSIS, ROUTINE W REFLEX MICROSCOPIC
Bilirubin Urine: NEGATIVE
Ketones, ur: NEGATIVE
Leukocytes, UA: NEGATIVE
Urine Glucose: NEGATIVE
Urobilinogen, UA: 0.2 (ref 0.0–1.0)

## 2012-07-06 LAB — LIPID PANEL
HDL: 34.7 mg/dL — ABNORMAL LOW (ref >39.00)
Total CHOL/HDL Ratio: 5
Triglycerides: 143 mg/dL (ref 0.0–149.0)

## 2012-07-06 LAB — HEMOGLOBIN A1C: Hgb A1c MFr Bld: 5.4 % (ref 4.6–6.5)

## 2012-07-06 MED ORDER — AZITHROMYCIN 500 MG PO TABS: 1000.0000 mg | ORAL_TABLET | Freq: Every day | ORAL | Status: DC

## 2012-07-06 MED ORDER — CEFTRIAXONE SODIUM 500 MG IJ SOLR
250.0000 mg | Freq: Once | INTRAMUSCULAR | Status: AC
  Administered 2012-07-06: 250 mg via INTRAMUSCULAR

## 2012-07-06 NOTE — Patient Instructions (Addendum)
-  We have ordered labs or studies at this visit. It can take up to 1-2 weeks for results and processing. We will contact you with instructions IF your results are abnormal. Normal results will be released to your [facility]. If you have not heard from [place] or can not find your results in [facility] in 2 weeks please contact our office.  -PLEASE SIGN UP FOR MYCHART TODAY   We recommend the following healthy lifestyle measures: - eat a healthy diet consisting of lots of vegetables, fruits, beans, nuts, seeds, healthy meats such as white chicken and fish and whole grains.  - avoid fried foods, fast food, processed foods, sodas, red meet and other fattening foods.  - get a least 150 minutes of aerobic exercise per week.   Use condoms EVERY time you have intercourse  Follow up in: as needed or if any continued symptoms
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
## 2012-07-07 LAB — HIV ANTIBODY (ROUTINE TESTING W REFLEX): HIV: NONREACTIVE

## 2012-07-07 LAB — RPR

## 2012-07-07 LAB — HSV 1 ANTIBODY, IGG: HSV 1 Glycoprotein G Ab, IgG: 0.11 IV

## 2012-07-07 LAB — HSV 2 ANTIBODY, IGG: HSV 2 Glycoprotein G Ab, IgG: 0.14 IV

## 2012-07-08 ENCOUNTER — Telehealth: Payer: Self-pay | Admitting: Family Medicine

## 2012-07-08 LAB — URINE CULTURE

## 2012-07-08 NOTE — Telephone Encounter (Signed)
Number to contact pt in note, please let him know all labs so far look ok. His cholesterol is a little abnormal and a healthy diet and regular exercise is recommended. The herpes testing was borderline. He should use condoms when having sex and should let us know immediately if ever has any genital ulcers.

## 2012-07-08 NOTE — Telephone Encounter (Signed)
Called and spoke with pt and pt is aware. Advised pt per Gwenn to come back and give a urine sample- no appt needed. Pt is aware.

## 2012-07-20 ENCOUNTER — Other Ambulatory Visit: Payer: Self-pay

## 2012-07-20 ENCOUNTER — Telehealth: Payer: Self-pay | Admitting: Family Medicine

## 2012-07-20 LAB — GC/CHLAMYDIA PROBE AMP, URINE

## 2012-07-20 NOTE — Telephone Encounter (Signed)
Stuart Rivers,  Can you call lab and find out why they did not test for GC/Chlam as ordered? Also, let him know all other labs except those were normal. If any further or recurrent symptoms should be seen.

## 2012-07-20 NOTE — Telephone Encounter (Signed)
error 

## 2012-07-21 NOTE — Telephone Encounter (Signed)
Called pt on 07/08/12 to give lab results and advised per Gwenn to return to give another urine specimen.  See previous note:   Attempted to call pt cell phone number states pt cannot take calls at this time and home number - no answer.  Will attempt to call at later time.

## 2012-07-24 NOTE — Telephone Encounter (Signed)
Attempted to call pt multiple times.  No return call.

## 2012-07-27 NOTE — Telephone Encounter (Signed)
Letter mailed to pts home address.  

## 2013-03-18 ENCOUNTER — Encounter: Payer: Self-pay | Admitting: Family Medicine

## 2013-03-18 ENCOUNTER — Ambulatory Visit (INDEPENDENT_AMBULATORY_CARE_PROVIDER_SITE_OTHER): Payer: BC Managed Care – PPO | Admitting: Family Medicine

## 2013-03-18 VITALS — BP 118/72 | Temp 98.3°F | Ht 66.0 in | Wt 180.0 lb

## 2013-03-18 DIAGNOSIS — Z23 Encounter for immunization: Secondary | ICD-10-CM

## 2013-03-18 DIAGNOSIS — B353 Tinea pedis: Secondary | ICD-10-CM

## 2013-03-18 DIAGNOSIS — Z7251 High risk heterosexual behavior: Secondary | ICD-10-CM

## 2013-03-18 DIAGNOSIS — Z0000 Encounter for general adult medical examination without abnormal findings: Secondary | ICD-10-CM

## 2013-03-18 MED ORDER — AZITHROMYCIN 500 MG PO TABS: 1000.0000 mg | ORAL_TABLET | Freq: Once | ORAL | Status: DC

## 2013-03-18 MED ORDER — CEFTRIAXONE SODIUM 500 MG IJ SOLR
500.0000 mg | Freq: Once | INTRAMUSCULAR | Status: AC
  Administered 2013-03-18: 500 mg via INTRAMUSCULAR

## 2013-03-18 NOTE — Progress Notes (Signed)
Chief Complaint  Patient presents with  . Annual Exam    HPI:  Here for physical and concerned for gonorrhea or chlamydia. Reports x-girlfriend told him she had gonorrhea or chlamydia last Thursday. Pt reports he thought he saw a small amount of penile discharge yesterday. Wants testing for GC/Chlam/trich -denies: dysuria, blood from penis, penile pain, fevers, rash, chills, NVD  -he reports had vaccine at 18 before college  Athletes Feet: -R foot - itchy   ROS: See pertinent positives and negatives per HPI.  Past Medical History  Diagnosis Date  . No pertinent past medical history   . Depression     Family History  Problem Relation Age of Onset  . Arthritis      grandparent  . Breast cancer Other   . Heart disease      grandparent  . Hypertension      grandparent  . Diabetes      parent  . Diabetes      grandparent    History   Social History  . Marital Status: Single    Spouse Name: N/A    Number of Children: N/A  . Years of Education: N/A   Social History Main Topics  . Smoking status: Current Every Day Smoker    Types: Cigars  . Smokeless tobacco: None     Comment: few cigarettes a day  . Alcohol Use: Yes     Comment: 3-4 drinks about 2 times per week; never drives after drinking  . Drug Use: Yes    Special: Marijuana  . Sexually Active: None   Other Topics Concern  . None   Social History Narrative   Work or School: Horticulturist, commercial Situation: lives with grandparents      Spiritual Beliefs: Christian      Lifestyle: no regular exercise but hard work as Administrator - poor diet            Current outpatient prescriptions:azithromycin (ZITHROMAX) 500 MG tablet, Take 2 tablets (1,000 mg total) by mouth once., Disp: 2 tablet, Rfl: 0;  traMADol (ULTRAM) 50 MG tablet, Take 50 mg by mouth every 6 (six) hours as needed. , Disp: , Rfl:   EXAM:  Filed Vitals:   03/18/13 1446  BP: 118/72  Temp: 98.3 F (36.8 C)    Body mass index is  29.07 kg/(m^2).  GENERAL: vitals reviewed and listed above, alert, oriented, appears well hydrated and in no acute distress  HEENT: atraumatic, conjunttiva clear, no obvious abnormalities on inspection of external nose and ears  NECK: no obvious masses on inspection  LUNGS: clear to auscultation bilaterally, no wheezes, rales or rhonchi, good air movement  CV: HRRR, no peripheral edema  MS: moves all extremities without noticeable abnormality  GU: normal except small amount expressed penile discharge  PSYCH: pleasant and cooperative, no obvious depression or anxiety  ASSESSMENT AND PLAN:  Discussed the following assessment and plan:  High risk sexual behavior - Plan: HIV Antibody, RPR, GC/Chlamydia Amp Probe, Urine, Urine cytology ancillary only, azithromycin (ZITHROMAX) 500 MG tablet -refuses genital probe -will tx for GC and Chalm with rocephin IM here and azithro per hx and do urine and blood tests for STIs -offered hpv vaccine -advised all sexual partner in 60 days be advised of possible GC/chlam for testing and tx -safe sec counseling  Annual physical exam -smoking and alcohol counseling -safety counseling -diet and exercise counseling -hpv faccine -all USPSHTF level a and b recs reviewed  Tinea pedis -lamisil topical, foot care instructions, follow up if persists  -Patient advised to return or notify a doctor immediately if symptoms worsen or persist or new concerns arise.  There are no Patient Instructions on file for this visit.   Kriste Basque R.

## 2013-03-18 NOTE — Addendum Note (Signed)
Addended by: Azucena Freed on: 03/18/2013 03:31 PM   Modules accepted: Orders

## 2013-03-18 NOTE — Addendum Note (Signed)
Addended by: Azucena Freed on: 03/18/2013 03:42 PM   Modules accepted: Orders

## 2013-03-18 NOTE — Patient Instructions (Addendum)
Athlete's Foot Athlete's foot (tinea pedis) is a fungal infection of the skin on the feet. It often occurs on the skin between the toes or underneath the toes. It can also occur on the soles of the feet. Athlete's foot is more likely to occur in hot, humid weather. Not washing your feet or changing your socks often enough can contribute to athlete's foot. The infection can spread from person to person (contagious). CAUSES Athlete's foot is caused by a fungus. This fungus thrives in warm, moist places. Most people get athlete's foot by sharing shower stalls, towels, and wet floors with an infected person. People with weakened immune systems, including those with diabetes, may be more likely to get athlete's foot. SYMPTOMS   Itchy areas between the toes or on the soles of the feet.  White, flaky, or scaly areas between the toes or on the soles of the feet.  Tiny, intensely itchy blisters between the toes or on the soles of the feet.  Tiny cuts on the skin. These cuts can develop a bacterial infection.  Thick or discolored toenails. DIAGNOSIS  Your caregiver can usually tell what the problem is by doing a physical exam. Your caregiver may also take a skin sample from the rash area. The skin sample may be examined under a microscope, or it may be tested to see if fungus will grow in the sample. A sample may also be taken from your toenail for testing. TREATMENT  Over-the-countermedicines can be used to kill the fungus. These medicines are available as powders or creams. Your caregiver can suggest medicines for you. Fungal infections respond slowly to treatment. You may need to continue using your medicine for several weeks. LAMISIL  PREVENTION   Do not share towels.  Wear sandals in wet areas, such as shared locker rooms and shared showers.  Keep your feet dry. Wear shoes that allow air to circulate. Wear cotton or wool socks. HOME CARE INSTRUCTIONS   Take medicines as directed by your  caregiver. Do not use steroid creams on athlete's foot.  Keep your feet clean and cool. Wash your feet daily and dry them thoroughly, especially between your toes.  Change your socks every day. Wear cotton or wool socks. In hot climates, you may need to change your socks 2 to 3 times per day.  Wear sandals or canvas tennis shoes with good air circulation.  If you have blisters, soak your feet in Burow's solution or Epsom salts for 20 to 30 minutes, 2 times a day to dry out the blisters. Make sure you dry your feet thoroughly afterward. SEEK MEDICAL CARE IF:   You have a fever.  You have swelling, soreness, warmth, or redness in your foot.  You are not getting better after 7 days of treatment.  You are not completely cured after 30 days.  You have any problems caused by your medicines. MAKE SURE YOU:   Understand these instructions.  Will watch your condition.  Will get help right away if you are not doing well or get worse. Document Released: 08/09/2000 Document Revised: 11/04/2011 Document Reviewed: 05/31/2011 ExitCare Patient Information 2014 ExitCare, [place].  TAKE medication Azithromycin as prescribed.  Use condoms every time you have sex.  We have ordered labs or studies at this visit. It can take up to 1-2 weeks for results and processing. We will contact you with instructions IF your results are abnormal. Normal results will be released to your [facility]. If you have not heard from [place] or  can not find your results in [facility] in 2 weeks please contact our office.  Stop smoking.  Follow up as needed.

## 2013-03-19 LAB — GC/CHLAMYDIA PROBE AMP, URINE
Chlamydia, Swab/Urine, PCR: POSITIVE — AB
GC Probe Amp, Urine: POSITIVE — AB

## 2013-03-19 LAB — RPR

## 2013-03-22 NOTE — Progress Notes (Signed)
Quick Note:  Attempted to call pt; vm is full; will call at a later time. ______

## 2013-03-23 NOTE — Progress Notes (Signed)
Quick Note:  Attempted to call pt at all numbers on file. Will attempt to call at a later time. ______

## 2013-03-24 NOTE — Progress Notes (Signed)
Quick Note:  Called and spoke with pt and pt is aware. ______ 

## 2013-04-22 ENCOUNTER — Telehealth: Payer: Self-pay | Admitting: Family Medicine

## 2013-04-22 NOTE — Telephone Encounter (Signed)
Returned pt's call.  Left a message for return call.

## 2013-04-22 NOTE — Telephone Encounter (Signed)
Patient needs to come in soon because he needs a follow up from last visit. appt scheduled.

## 2013-04-22 NOTE — Telephone Encounter (Addendum)
Opened in error

## 2013-04-22 NOTE — Telephone Encounter (Signed)
Pt would like nurse to return his call. Pt stated this is personal.

## 2013-04-22 NOTE — Telephone Encounter (Signed)
PT called back to speak with you regarding personal matters.

## 2013-04-27 ENCOUNTER — Encounter: Payer: BC Managed Care – PPO | Admitting: Family Medicine

## 2013-04-27 NOTE — Progress Notes (Signed)
No show  This encounter was created in error - please disregard.

## 2013-04-29 ENCOUNTER — Other Ambulatory Visit (HOSPITAL_COMMUNITY)
Admission: RE | Admit: 2013-04-29 | Discharge: 2013-04-29 | Disposition: A | Discharge status: Home / Self Care | Payer: BC Managed Care – PPO | Source: Ambulatory Visit | Attending: Family Medicine | Admitting: Family Medicine

## 2013-04-29 ENCOUNTER — Ambulatory Visit (INDEPENDENT_AMBULATORY_CARE_PROVIDER_SITE_OTHER): Payer: BC Managed Care – PPO | Admitting: Family Medicine

## 2013-04-29 VITALS — BP 110/80 | Temp 98.5°F | Wt 184.0 lb

## 2013-04-29 DIAGNOSIS — R300 Dysuria: Secondary | ICD-10-CM

## 2013-04-29 DIAGNOSIS — Z113 Encounter for screening for infections with a predominantly sexual mode of transmission: Secondary | ICD-10-CM | POA: Insufficient documentation

## 2013-04-29 DIAGNOSIS — R369 Urethral discharge, unspecified: Secondary | ICD-10-CM

## 2013-04-29 MED ORDER — AZITHROMYCIN 500 MG PO TABS: 2000.0000 mg | ORAL_TABLET | Freq: Once | ORAL | Status: DC

## 2013-04-29 MED ORDER — CEFTRIAXONE SODIUM 500 MG IJ SOLR
500.0000 mg | Freq: Once | INTRAMUSCULAR | Status: AC
  Administered 2013-04-29: 250 mg via INTRAMUSCULAR

## 2013-04-29 NOTE — Progress Notes (Signed)
No chief complaint on file.   HPI:   Acute visit for penile discharge: -hx of chamydia and gonorrhea treated -had STI testing recently -reports: burning with urination and some penile discharge for last few weeks -denies: fevers, chills, abd pain, joint pain, rash, flank pain, hematuria -has had sex, male partner, but reports using condoms  ROS: See pertinent positives and negatives per HPI.  Past Medical History  Diagnosis Date  . No pertinent past medical history   . Depression     Past Surgical History  Procedure Laterality Date  . No past surgeries    . Orif tibia fracture  04/05/2012    Procedure: OPEN REDUCTION INTERNAL FIXATION (ORIF) TIBIA FRACTURE;  Surgeon: Shelda Pal, MD;  Location: [facility] OR;  Service: Orthopedics;  Laterality: Left;  . Closed reduction nasal fracture  04/07/2012    Procedure: CLOSED REDUCTION NASAL FRACTURE;  Surgeon: Wayland Denis, DO;  Location: [facility] OR;  Service: Plastics;  Laterality: N/A;    Family History  Problem Relation Age of Onset  . Arthritis      grandparent  . Breast cancer Other   . Heart disease      grandparent  . Hypertension      grandparent  . Diabetes      parent  . Diabetes      grandparent    History   Social History  . Marital Status: Single    Spouse Name: N/A    Number of Children: N/A  . Years of Education: N/A   Social History Main Topics  . Smoking status: Current Every Day Smoker    Types: Cigars  . Smokeless tobacco: Not on file     Comment: few cigarettes a day  . Alcohol Use: Yes     Comment: 3-4 drinks about 2 times per week; never drives after drinking  . Drug Use: Yes    Special: Marijuana  . Sexual Activity: Not on file   Other Topics Concern  . 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 , Disp: , Rfl:   EXAM:  Filed Vitals:   04/29/13 1518  BP: 110/80  Temp: 98.5 F (36.9 C)    Body mass index is 29.71 kg/(m^2).  GENERAL: vitals reviewed and listed above, alert, oriented, appears well hydrated and in no acute distress  HEENT: atraumatic, conjunttiva clear, no obvious abnormalities on inspection of external nose and ears  NECK: no obvious masses on inspection  GU: normal except for white penile discharge  MS: moves all extremities without noticeable abnormality  PSYCH: pleasant and cooperative, no obvious depression or anxiety  ASSESSMENT AND PLAN:  Discussed the following assessment and plan:  Dysuria - Plan: Chlamydia culture, Gonococcus culture, Culture, Urine, Urine cytology ancillary only, azithromycin (ZITHROMAX) 500 MG tablet  Penile discharge - Plan: Chlamydia culture, Gonococcus culture, Culture, Urine, Urine cytology ancillary only, azithromycin (ZITHROMAX) 500 MG tablet  -tx empirically today with rocephin 250mg  im and azithro 2  grams once -safe sex counseling -prior to treatment obtained culture of penile discharge and urine samples for GC/Chlam/trich and culture -Patient advised to return or notify a doctor immediately if symptoms worsen or persist or new concerns arise.  There are no Patient Instructions on file for this visit.   Kriste Basque R.

## 2013-04-29 NOTE — Addendum Note (Signed)
Addended by: Azucena Freed on: 04/29/2013 03:58 PM   Modules accepted: Orders

## 2013-05-01 LAB — URINE CULTURE: Colony Count: NO GROWTH

## 2013-05-02 LAB — GONOCOCCUS CULTURE: Organism ID, Bacteria: NO GROWTH

## 2013-05-03 LAB — CHLAMYDIA CULTURE

## 2013-05-04 NOTE — Progress Notes (Signed)
Quick Note:  Called and spoke with pt and pt is aware. Referral for urologist ordered. ______

## 2013-05-04 NOTE — Addendum Note (Signed)
Addended by: Azucena Freed on: 05/04/2013 12:53 PM   Modules accepted: Orders

## 2013-05-06 ENCOUNTER — Encounter: Payer: Self-pay | Admitting: Family Medicine

## 2013-05-19 ENCOUNTER — Ambulatory Visit: Payer: BC Managed Care – PPO

## 2013-05-20 ENCOUNTER — Telehealth: Payer: Self-pay

## 2013-05-20 ENCOUNTER — Ambulatory Visit (INDEPENDENT_AMBULATORY_CARE_PROVIDER_SITE_OTHER): Payer: BC Managed Care – PPO

## 2013-05-20 DIAGNOSIS — Z23 Encounter for immunization: Secondary | ICD-10-CM

## 2013-05-20 NOTE — Telephone Encounter (Signed)
Left a message for return call.  

## 2013-05-20 NOTE — Telephone Encounter (Signed)
He can call number on psych counseling brochure to request this testing. If has ADD and wants treatment with medication will need to see a psychiatrist for this.

## 2013-05-20 NOTE — Telephone Encounter (Signed)
Patient came into the office for an HPV and would like to have testing for ADD.  Pls advise if pt needs an appt to be seen or if a referral can be made.

## 2013-05-24 NOTE — Telephone Encounter (Signed)
Left a message for return call.  

## 2013-05-25 NOTE — Telephone Encounter (Signed)
Called and spoke with pt and pt is aware.  

## 2013-06-10 ENCOUNTER — Ambulatory Visit (INDEPENDENT_AMBULATORY_CARE_PROVIDER_SITE_OTHER): Payer: BC Managed Care – PPO | Admitting: Psychology

## 2013-06-10 DIAGNOSIS — F411 Generalized anxiety disorder: Secondary | ICD-10-CM

## 2013-06-10 DIAGNOSIS — F331 Major depressive disorder, recurrent, moderate: Secondary | ICD-10-CM

## 2013-06-17 ENCOUNTER — Ambulatory Visit (INDEPENDENT_AMBULATORY_CARE_PROVIDER_SITE_OTHER): Payer: BC Managed Care – PPO | Admitting: Psychology

## 2013-06-17 DIAGNOSIS — F411 Generalized anxiety disorder: Secondary | ICD-10-CM

## 2013-06-17 DIAGNOSIS — F331 Major depressive disorder, recurrent, moderate: Secondary | ICD-10-CM

## 2013-06-28 ENCOUNTER — Ambulatory Visit (INDEPENDENT_AMBULATORY_CARE_PROVIDER_SITE_OTHER): Payer: BC Managed Care – PPO | Admitting: Psychology

## 2013-06-28 DIAGNOSIS — F411 Generalized anxiety disorder: Secondary | ICD-10-CM

## 2013-06-28 DIAGNOSIS — F3342 Major depressive disorder, recurrent, in full remission: Secondary | ICD-10-CM

## 2013-07-05 ENCOUNTER — Ambulatory Visit (INDEPENDENT_AMBULATORY_CARE_PROVIDER_SITE_OTHER): Payer: BC Managed Care – PPO | Admitting: Psychology

## 2013-07-05 ENCOUNTER — Encounter: Payer: Self-pay | Admitting: Family

## 2013-07-05 ENCOUNTER — Ambulatory Visit (INDEPENDENT_AMBULATORY_CARE_PROVIDER_SITE_OTHER): Payer: BC Managed Care – PPO | Admitting: Family

## 2013-07-05 VITALS — BP 140/90 | Temp 98.6°F | Wt 185.0 lb

## 2013-07-05 DIAGNOSIS — M25562 Pain in left knee: Secondary | ICD-10-CM

## 2013-07-05 DIAGNOSIS — F3342 Major depressive disorder, recurrent, in full remission: Secondary | ICD-10-CM

## 2013-07-05 DIAGNOSIS — F329 Major depressive disorder, single episode, unspecified: Secondary | ICD-10-CM

## 2013-07-05 DIAGNOSIS — M25569 Pain in unspecified knee: Secondary | ICD-10-CM

## 2013-07-05 DIAGNOSIS — F411 Generalized anxiety disorder: Secondary | ICD-10-CM

## 2013-07-05 MED ORDER — MELOXICAM 15 MG PO TABS: 15.0000 mg | ORAL_TABLET | Freq: Every day | ORAL | Status: DC

## 2013-07-05 MED ORDER — SERTRALINE HCL 50 MG PO TABS: 50.0000 mg | ORAL_TABLET | Freq: Every day | ORAL | Status: DC

## 2013-07-05 NOTE — Patient Instructions (Signed)
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
## 2013-07-07 ENCOUNTER — Telehealth: Payer: Self-pay

## 2013-07-07 NOTE — Telephone Encounter (Signed)
Received a fax from [facility] stating that pt no showed an appt on 06/08/2013.  Called and spoke with pt and pt states he did not know about that appt.  Advised that pt call the office to see if another appt can be set up.  Pt given information to call.

## 2013-07-15 ENCOUNTER — Ambulatory Visit (INDEPENDENT_AMBULATORY_CARE_PROVIDER_SITE_OTHER): Payer: BC Managed Care – PPO | Admitting: Psychology

## 2013-07-15 DIAGNOSIS — F411 Generalized anxiety disorder: Secondary | ICD-10-CM

## 2013-07-15 DIAGNOSIS — F331 Major depressive disorder, recurrent, moderate: Secondary | ICD-10-CM

## 2013-07-16 ENCOUNTER — Ambulatory Visit (INDEPENDENT_AMBULATORY_CARE_PROVIDER_SITE_OTHER): Payer: BC Managed Care – PPO | Admitting: Family Medicine

## 2013-07-16 ENCOUNTER — Encounter: Payer: Self-pay | Admitting: Family Medicine

## 2013-07-16 ENCOUNTER — Encounter: Payer: BC Managed Care – PPO | Admitting: Family Medicine

## 2013-07-16 VITALS — BP 120/76 | Temp 98.4°F | Wt 185.0 lb

## 2013-07-16 DIAGNOSIS — N5312 Painful ejaculation: Secondary | ICD-10-CM

## 2013-07-16 DIAGNOSIS — R300 Dysuria: Secondary | ICD-10-CM

## 2013-07-16 DIAGNOSIS — N508 Other specified disorders of male genital organs: Secondary | ICD-10-CM

## 2013-07-16 LAB — URINALYSIS, ROUTINE W REFLEX MICROSCOPIC
Bilirubin Urine: NEGATIVE
Leukocytes, UA: NEGATIVE
Nitrite: NEGATIVE
Specific Gravity, Urine: 1.015 (ref 1.000–1.030)
Total Protein, Urine: NEGATIVE
pH: 6 (ref 5.0–8.0)

## 2013-07-16 NOTE — Progress Notes (Signed)
error    This encounter was created in error - please disregard.

## 2013-07-16 NOTE — Patient Instructions (Signed)
-  We have ordered labs or studies at this visit. It can take up to 1-2 weeks for results and processing. We will contact you with instructions IF your results are abnormal. Normal results will be released to your [facility]. If you have not heard from [place] or can not find your results in [facility] in 2 weeks please contact our office.  -no unprotected sex  -make sure to see urologist as scheduled  -follow up with [place] after seeing urologist or sooner if concerns

## 2013-07-16 NOTE — Progress Notes (Signed)
No chief complaint on file.   HPI:  Ongoing penile complaint: -a little burning with urinating and ejaculation - last treatment did not change symptoms -denies: discharge, fevers, change in urine, abd pain, nausea vomiting, rash on penis -he has hadintermittent penile discharge and recurrent symptoms atlast several visits, has been treated for GC/Chlam several times given hx of this, but negative cultures of urethral discharge and neg urine cytology last visit  -he did schedule appt with urologist in a few weeks -he prefers not to have exam or repeat urethral swab this visit as is seeing urologist, does want to check urine today and treat if any findings -has been using condoms   ROS: See pertinent positives and negatives per HPI.  Past Medical History  Diagnosis Date  . No pertinent past medical history   . Depression     Past Surgical History  Procedure Laterality Date  . No past surgeries    . Orif tibia fracture  04/05/2012    Procedure: OPEN REDUCTION INTERNAL FIXATION (ORIF) TIBIA FRACTURE;  Surgeon: Shelda Pal, MD;  Location: [facility] OR;  Service: Orthopedics;  Laterality: Left;  . Closed reduction nasal fracture  04/07/2012    Procedure: CLOSED REDUCTION NASAL FRACTURE;  Surgeon: Wayland Denis, DO;  Location: [facility] OR;  Service: Plastics;  Laterality: N/A;    Family History  Problem Relation Age of Onset  . Arthritis      grandparent  . Breast cancer Other   . Heart disease      grandparent  . Hypertension      grandparent  . Diabetes      parent  . Diabetes      grandparent    History   Social History  . Marital Status: Single    Spouse Name: N/A    Number of Children: N/A  . Years of Education: N/A   Social History Main Topics  . Smoking status: Current Every Day Smoker    Types: Cigars  . Smokeless tobacco: None     Comment: few cigarettes a day  . Alcohol Use: Yes     Comment: 3-4 drinks about 2 times per week; never drives after drinking  . Drug  Use: Yes    Special: Marijuana  . Sexual Activity: None   Other Topics Concern  . 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 , Disp: , Rfl:   EXAM:  Filed Vitals:   07/16/13 0903  BP: 120/76  Temp: 98.4 F (36.9 C)    Body mass index is 29.87 kg/(m^2).  GENERAL: vitals reviewed and listed above, alert, oriented, appears well hydrated and in no acute distress  HEENT: atraumatic, conjunttiva clear, no obvious abnormalities on inspection of external nose and ears  NECK: no obvious masses on inspection  MS: moves all extremities without noticeable abnormality  PSYCH: pleasant and cooperative, no  obvious depression or anxiety  ASSESSMENT AND PLAN:  Discussed the following assessment and plan:  Dysuria - Plan: Culture, Urine, Urinalysis with Reflex Microscopic  Pain with ejaculation - Plan: Culture, Urine, Urinalysis with Reflex Microscopic  -persistent recurrent dysuria and penile discharge (this currently is resolved) -seeing urology for this so did not want to get exam, swab today - these were negative last visit -urine and urine culture pending -offere empiric tx STI given hx, but wanted to hold off onthis for now - advised no sex until testing/visit with urology -Patient advised to return or notify a doctor immediately if symptoms worsen or persist or new concerns arise.  Patient Instructions  -We have ordered labs or studies at this visit. It can take up to  1-2 weeks for results and processing. We will contact you with instructions IF your results are abnormal. Normal results will be released to your [facility]. If you have not heard from [place] or can not find your results in [facility] in 2 weeks please contact our office.  -no unprotected sex  -make sure to see urologist as scheduled  -follow up with [place] after seeing urologist or sooner if concerns         Kriste Basque R.

## 2013-07-16 NOTE — Progress Notes (Signed)
Pre visit review using our clinic review tool, if applicable. No additional management support is needed unless otherwise documented below in the visit note. 

## 2013-07-18 LAB — URINE CULTURE: Organism ID, Bacteria: NO GROWTH

## 2013-08-02 ENCOUNTER — Ambulatory Visit: Payer: BC Managed Care – PPO | Admitting: Family

## 2013-08-04 ENCOUNTER — Ambulatory Visit (INDEPENDENT_AMBULATORY_CARE_PROVIDER_SITE_OTHER): Payer: BC Managed Care – PPO | Admitting: Family Medicine

## 2013-08-04 ENCOUNTER — Encounter: Payer: Self-pay | Admitting: Family Medicine

## 2013-08-04 VITALS — BP 130/90 | Temp 97.9°F | Wt 182.0 lb

## 2013-08-04 DIAGNOSIS — F329 Major depressive disorder, single episode, unspecified: Secondary | ICD-10-CM

## 2013-08-04 MED ORDER — SERTRALINE HCL 50 MG PO TABS: 75.0000 mg | ORAL_TABLET | Freq: Every day | ORAL | Status: DC

## 2013-08-04 NOTE — Patient Instructions (Signed)
-  increase zoloft to 75mg  daily  -call your orthopedic doctor about your leg pain  -schedule appointment with your counselor  -follow up in 2 months
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
## 2013-09-09 ENCOUNTER — Encounter: Payer: Self-pay | Admitting: Family Medicine

## 2013-09-09 NOTE — Progress Notes (Signed)
Received a fax from [facility] that pt no showed appt on 09/08/13 and the pt can have no further appointments will be scheduled due to the multiple no shows.  Shown to Dr. Selena BattenKim.

## 2013-10-06 ENCOUNTER — Telehealth: Payer: Self-pay

## 2013-10-06 NOTE — Telephone Encounter (Signed)
Called and spoke with pt and pt states he is not scheduled for penile issues tomorrow. Pt states he needs to discuss switching therapist.

## 2013-10-06 NOTE — Telephone Encounter (Signed)
Message copied by Azucena FreedMILLNER, Yug Loria C on Wed Oct 06, 2013 12:24 PM ------      Message from: Terressa KoyanagiKIM, HANNAH R      Created: Wed Oct 06, 2013 12:03 PM       Elease HashimotoAlisha,            He is scheduled for tomorrow. I do not know how to help him further regarding his penile/urine issues and had advised he see the urologist. He no showed at the urology office several times per referral notes and [facility] will no longer [facility]. I would advise referral to [facility] or elsewhere.       He does not need to see me for this as I have advised he needs to see a urologist.      Thanks.            Dr. Selena BattenKim ------

## 2013-10-07 ENCOUNTER — Encounter: Payer: BC Managed Care – PPO | Admitting: Family Medicine

## 2013-10-07 DIAGNOSIS — Z0289 Encounter for other administrative examinations: Secondary | ICD-10-CM

## 2013-10-07 NOTE — Progress Notes (Signed)
Error   This encounter was created in error - please disregard. 

## 2013-12-28 ENCOUNTER — Telehealth: Payer: Self-pay | Admitting: Family Medicine

## 2013-12-28 ENCOUNTER — Encounter: Payer: Self-pay | Admitting: Family Medicine

## 2013-12-28 ENCOUNTER — Ambulatory Visit (INDEPENDENT_AMBULATORY_CARE_PROVIDER_SITE_OTHER): Payer: BC Managed Care – PPO | Admitting: Family Medicine

## 2013-12-28 VITALS — BP 124/80 | HR 67 | Temp 98.7°F | Ht 66.0 in | Wt 192.0 lb

## 2013-12-28 DIAGNOSIS — Z7251 High risk heterosexual behavior: Secondary | ICD-10-CM

## 2013-12-28 DIAGNOSIS — M25569 Pain in unspecified knee: Secondary | ICD-10-CM

## 2013-12-28 DIAGNOSIS — F3289 Other specified depressive episodes: Secondary | ICD-10-CM

## 2013-12-28 DIAGNOSIS — F32A Depression, unspecified: Secondary | ICD-10-CM

## 2013-12-28 DIAGNOSIS — F329 Major depressive disorder, single episode, unspecified: Secondary | ICD-10-CM

## 2013-12-28 MED ORDER — AZITHROMYCIN 500 MG PO TABS: 1000.0000 mg | ORAL_TABLET | Freq: Every day | ORAL | Status: DC

## 2013-12-28 MED ORDER — CEFTRIAXONE SODIUM 500 MG IJ SOLR
500.0000 mg | Freq: Once | INTRAMUSCULAR | Status: AC
  Administered 2013-12-28: 500 mg via INTRAMUSCULAR

## 2013-12-28 NOTE — Addendum Note (Signed)
Addended by: Azucena FreedMILLNER, Karmen Altamirano C on: 12/28/2013 10:49 AM   Modules accepted: Orders

## 2013-12-28 NOTE — Progress Notes (Signed)
Pre visit review using our clinic review tool, if applicable. No additional management support is needed unless otherwise documented below in the visit note. 

## 2013-12-28 NOTE — Progress Notes (Signed)
No chief complaint on file.   HPI:  High Risk Sexual Behavior: -sexual contact with partner with Chlamydia -he developed penile discharge -denies: fevers, joint pains, abd pain, malaise, nausea vomiting -reports he has used condoms every time, but he wants treatment today for GC/Chlam -refuses HIV, Hep B and RPR testing  Depression: -on zoloft -seeing Dr. Dellia CloudGutterman - has appt scheduled  Hx leg surgery: -sees Dr. Charlann Boxerlin  ROS: See pertinent positives and negatives per HPI.  Past Medical History  Diagnosis Date  . No pertinent past medical history   . Depression     Past Surgical History  Procedure Laterality Date  . No past surgeries    . Orif tibia fracture  04/05/2012    Procedure: OPEN REDUCTION INTERNAL FIXATION (ORIF) TIBIA FRACTURE;  Surgeon: Shelda PalMatthew D Olin, MD;  [facility];  Service: Orthopedics;  Laterality: Left;  . Closed reduction nasal fracture  04/07/2012    Procedure: CLOSED REDUCTION NASAL FRACTURE;  Surgeon: Wayland Denislaire Sanger, DO;  Location: [facility] OR;  Service: Plastics;  Laterality: N/A;    Family History  Problem Relation Age of Onset  . Arthritis      grandparent  . Breast cancer Other   . Heart disease      grandparent  . Hypertension      grandparent  . Diabetes      parent  . Diabetes      grandparent    History   Social History  . Marital Status: Single    Spouse Name: N/A    Number of Children: N/A  . Years of Education: N/A   Social History Main Topics  . Smoking status: Current Every Day Smoker    Types: Cigars  . Smokeless tobacco: None     Comment: few cigarettes a day  . Alcohol Use: Yes     Comment: 3-4 drinks about 2 times per week; never drives after drinking  . Drug Use: Yes    Special: Marijuana  . Sexual Activity: None   Other Topics Concern  . None   Social History Narrative   Work or School: Horticulturist, commerciallandscaping      Home Situation: lives with grandparents      Spiritual Beliefs: Christian      Lifestyle: no regular  exercise but hard work as Administratorlandscaper - poor diet             Current outpatient prescriptions:meloxicam (MOBIC) 15 MG tablet, Take 1 tablet (15 mg total) by mouth daily., Disp: 30 tablet, Rfl: 3;  sertraline (ZOLOFT) 50 MG tablet, Take 1.5 tablets (75 mg total) by mouth daily., Disp: 45 tablet, Rfl: 3;  traMADol (ULTRAM) 50 MG tablet, Take 50 mg by mouth every 6 (six) hours as needed. , Disp: , Rfl:  azithromycin (ZITHROMAX) 500 MG tablet, Take 2 tablets (1,000 mg total) by mouth daily., Disp: 2 tablet, Rfl: 0  EXAM:  Filed Vitals:   12/28/13 1014  BP: 124/80  Pulse: 67  Temp: 98.7 F (37.1 C)    Body mass index is 31 kg/(m^2).  GENERAL: vitals reviewed and listed above, alert, oriented, appears well hydrated and in no acute distress  HEENT: atraumatic, conjunttiva clear, no obvious abnormalities on inspection of external nose and ears  NECK: no obvious masses on inspection  LUNGS: clear to auscultation bilaterally, no wheezes, rales or rhonchi, good air movement  CV: HRRR, no peripheral edema  MS: moves all extremities without noticeable abnormality  PSYCH: pleasant and cooperative, no obvious depression or  anxiety  ASSESSMENT AND PLAN:  Discussed the following assessment and plan:  Problems related to high-risk sexual behavior - Plan: GC/Chlamydia Amp Probe, Urine, azithromycin (ZITHROMAX) 500 MG tablet  Depression  Pain in joint, lower leg  -non-compliant -GC/chlam pending -rocephin inj and aazithro today -safe sex practices advised -he refused all other sti testing -follow up with urologist, advised we will not manage this issue and must see urologist as reports condom use but continued issues and neg testing on several occassions -follow up 3 months -Patient advised to return or notify a doctor immediately if symptoms worsen or persist or new concerns arise.  Patient Instructions  -schedule appointment with urologist about your ongoing penile issues - we  will not be seeing you again for this problem as we have advised you to see the urologist on multiple occassions  -condoms with ALL intercourse  -take all of the azithromycin today     Terressa KoyanagiHannah R. Mohamud Mrozek

## 2013-12-28 NOTE — Telephone Encounter (Signed)
Relevant patient education assigned to patient using Emmi. ° °

## 2013-12-28 NOTE — Patient Instructions (Addendum)
-  schedule appointment with urologist about your ongoing penile issues - we will not be seeing you again for this problem as we have advised you to see the urologist on multiple occassions  -condoms with ALL intercourse  -take all of the azithromycin today

## 2014-01-03 ENCOUNTER — Ambulatory Visit (INDEPENDENT_AMBULATORY_CARE_PROVIDER_SITE_OTHER): Payer: BC Managed Care – PPO | Admitting: Psychology

## 2014-01-03 DIAGNOSIS — F411 Generalized anxiety disorder: Secondary | ICD-10-CM

## 2014-01-03 DIAGNOSIS — F3342 Major depressive disorder, recurrent, in full remission: Secondary | ICD-10-CM

## 2014-01-28 ENCOUNTER — Ambulatory Visit (INDEPENDENT_AMBULATORY_CARE_PROVIDER_SITE_OTHER): Payer: BC Managed Care – PPO | Admitting: Psychology

## 2014-01-28 DIAGNOSIS — F411 Generalized anxiety disorder: Secondary | ICD-10-CM

## 2014-01-28 DIAGNOSIS — F3342 Major depressive disorder, recurrent, in full remission: Secondary | ICD-10-CM

## 2014-02-14 ENCOUNTER — Ambulatory Visit: Payer: BC Managed Care – PPO | Admitting: Psychology

## 2014-02-17 ENCOUNTER — Ambulatory Visit (INDEPENDENT_AMBULATORY_CARE_PROVIDER_SITE_OTHER): Payer: BC Managed Care – PPO | Admitting: Psychology

## 2014-02-17 DIAGNOSIS — F331 Major depressive disorder, recurrent, moderate: Secondary | ICD-10-CM

## 2014-02-17 DIAGNOSIS — F411 Generalized anxiety disorder: Secondary | ICD-10-CM

## 2014-05-31 ENCOUNTER — Encounter (HOSPITAL_COMMUNITY): Payer: Self-pay | Admitting: Emergency Medicine

## 2014-05-31 ENCOUNTER — Emergency Department (INDEPENDENT_AMBULATORY_CARE_PROVIDER_SITE_OTHER): Payer: BC Managed Care – PPO

## 2014-05-31 ENCOUNTER — Emergency Department (INDEPENDENT_AMBULATORY_CARE_PROVIDER_SITE_OTHER)
Admission: EM | Admit: 2014-05-31 | Discharge: 2014-05-31 | Disposition: A | Discharge status: Home / Self Care | Payer: BC Managed Care – PPO | Source: Home / Self Care | Attending: Family Medicine | Admitting: Family Medicine

## 2014-05-31 DIAGNOSIS — M79605 Pain in left leg: Secondary | ICD-10-CM

## 2014-05-31 DIAGNOSIS — M79602 Pain in left arm: Secondary | ICD-10-CM

## 2014-05-31 MED ORDER — MELOXICAM 7.5 MG PO TABS: 7.5000 mg | ORAL_TABLET | Freq: Two times a day (BID) | ORAL | Status: DC

## 2014-05-31 NOTE — ED Notes (Signed)
Reports left leg pain.  Hx of injury/surgery 2013.   Pt c/o a constant throbbing pain through out the day.  States "pain is worse at night".   Mild swelling.   No relief with otc pain meds.

## 2014-05-31 NOTE — Discharge Instructions (Signed)
See dr Charlann Boxerolin if further leg problems.

## 2014-05-31 NOTE — ED Provider Notes (Addendum)
CSN: [id number]     Arrival date & time 05/31/14  1639 History   None    Chief Complaint  Patient presents with  . Leg Pain   (Consider location/radiation/quality/duration/timing/severity/associated sxs/prior Treatment) Patient is a 23 y.o. male presenting with leg pain. The history is provided by the patient.  Leg Pain Location:  Leg Time since incident:  1 week Injury: no   Leg location:  L lower leg (s/p rod in left tibia 2013, dr Charlann Boxer) Pain details:    Quality:  Aching Chronicity:  Chronic Dislocation: no   Prior injury to area:  Yes Relieved by:  Nothing Ineffective treatments:  NSAIDs and acetaminophen Associated symptoms: no back pain, no decreased ROM, no fever, no stiffness and no swelling     Past Medical History  Diagnosis Date  . No pertinent past medical history   . Depression    Past Surgical History  Procedure Laterality Date  . No past surgeries    . Orif tibia fracture  04/05/2012    Procedure: OPEN REDUCTION INTERNAL FIXATION (ORIF) TIBIA FRACTURE;  Surgeon: Shelda Pal, MD;  Location[facility] OR;  Service: Orthopedics;  Laterality: Left;  . Closed reduction nasal fracture  04/07/2012    Procedure: CLOSED REDUCTION NASAL FRACTURE;  Surgeon: Wayland Denis, DO;  Location: [facility] OR;  Service: Plastics;  Laterality: N/A;   Family History  Problem Relation Age of Onset  . Arthritis      grandparent  . Breast cancer Other   . Heart disease      grandparent  . Hypertension      grandparent  . Diabetes      parent  . Diabetes      grandparent   History  Substance Use Topics  . Smoking status: Current Every Day Smoker    Types: Cigars  . Smokeless tobacco: Not on file     Comment: few cigarettes a day  . Alcohol Use: Yes     Comment: 3-4 drinks about 2 times per week; never drives after drinking    Review of Systems  Constitutional: Negative.  Negative for fever.  Musculoskeletal: Negative for back pain, gait problem, joint swelling, myalgias and  stiffness.  Skin: Negative.     Allergies  Review of patient's allergies indicates no known allergies.  Home Medications   Prior to Admission medications   Medication Sig Start Date End Date Taking? Authorizing Provider  azithromycin (ZITHROMAX) 500 MG tablet Take 2 tablets (1,000 mg total) by mouth daily. 12/28/13   Terressa Koyanagi, DO  meloxicam (MOBIC) 15 MG tablet Take 1 tablet (15 mg total) by mouth daily. 07/05/13   Baker Pierini, FNP  meloxicam (MOBIC) 7.5 MG tablet Take 1 tablet (7.5 mg total) by mouth 2 (two) times daily after a meal. 05/31/14   Linna Hoff, MD  sertraline (ZOLOFT) 50 MG tablet Take 1.5 tablets (75 mg total) by mouth daily. 08/04/13   Terressa Koyanagi, DO  traMADol (ULTRAM) 50 MG tablet Take 50 mg by mouth every 6 (six) hours as needed.  07/01/12   Historical Provider, MD   BP 127/76  Pulse 78  Temp(Src) 98.4 F (36.9 C) (Oral)  Resp 16  Ht 5\' 8"  (1.727 m)  Wt 187 lb (84.823 kg)  BMI 28.44 kg/m2  SpO2 97% Physical Exam  Nursing note and vitals reviewed. Constitutional: He is oriented to person, place, and time. He appears well-developed and well-nourished.  Abdominal: Soft. Bowel sounds are normal. There is  no tenderness.  Musculoskeletal: Normal range of motion. He exhibits tenderness. He exhibits no edema.       Left knee: Normal.       Left ankle: Normal.       Left lower leg: He exhibits tenderness and bony tenderness. He exhibits no swelling, no edema, no deformity and no laceration.       Legs: Neurological: He is alert and oriented to person, place, and time. No cranial nerve deficit. Coordination normal.  Skin: Skin is warm and dry.    ED Course  Procedures (including critical care time) Labs Review Labs Reviewed - No data to display  Imaging Review No results found.  X-rays reviewed and report per radiologist.       MDM   1. Lower extremity pain, central, left        Linna HoffJames D Nyonna Hargrove, MD 05/31/14 1927  Linna HoffJames D Twila Rappa,  MD 06/03/14 [phone]

## 2016-03-25 ENCOUNTER — Encounter (HOSPITAL_COMMUNITY): Payer: Self-pay | Admitting: Emergency Medicine

## 2016-03-25 ENCOUNTER — Emergency Department (HOSPITAL_COMMUNITY)
Admission: EM | Admit: 2016-03-25 | Discharge: 2016-03-26 | Disposition: A | Discharge status: Home / Self Care | Payer: Self-pay | Attending: Emergency Medicine | Admitting: Emergency Medicine

## 2016-03-25 DIAGNOSIS — F1290 Cannabis use, unspecified, uncomplicated: Secondary | ICD-10-CM | POA: Insufficient documentation

## 2016-03-25 DIAGNOSIS — F1414 Cocaine abuse with cocaine-induced mood disorder: Secondary | ICD-10-CM

## 2016-03-25 DIAGNOSIS — F17210 Nicotine dependence, cigarettes, uncomplicated: Secondary | ICD-10-CM | POA: Insufficient documentation

## 2016-03-25 DIAGNOSIS — R443 Hallucinations, unspecified: Secondary | ICD-10-CM

## 2016-03-25 DIAGNOSIS — R45851 Suicidal ideations: Secondary | ICD-10-CM | POA: Insufficient documentation

## 2016-03-25 DIAGNOSIS — Z79899 Other long term (current) drug therapy: Secondary | ICD-10-CM | POA: Insufficient documentation

## 2016-03-25 DIAGNOSIS — R440 Auditory hallucinations: Secondary | ICD-10-CM | POA: Insufficient documentation

## 2016-03-25 HISTORY — DX: Anxiety disorder, unspecified: F41.9

## 2016-03-25 LAB — COMPREHENSIVE METABOLIC PANEL
ALT: 22 U/L (ref 17–63)
AST: 23 U/L (ref 15–41)
Albumin: 5.2 g/dL — ABNORMAL HIGH (ref 3.5–5.0)
Alkaline Phosphatase: 74 U/L (ref 38–126)
Anion gap: 10 (ref 5–15)
BUN: 16 mg/dL (ref 6–20)
CHLORIDE: 106 mmol/L (ref 101–111)
CO2: 23 mmol/L (ref 22–32)
CREATININE: 1.35 mg/dL — AB (ref 0.61–1.24)
Calcium: 9.8 mg/dL (ref 8.9–10.3)
GFR calc non Af Amer: >60 mL/min (ref >60)
Glucose, Bld: 88 mg/dL (ref 65–99)
POTASSIUM: 4.1 mmol/L (ref 3.5–5.1)
SODIUM: 139 mmol/L (ref 135–145)
Total Bilirubin: 1.1 mg/dL (ref 0.3–1.2)
Total Protein: 8.7 g/dL — ABNORMAL HIGH (ref 6.5–8.1)

## 2016-03-25 LAB — CBC
HCT: 50.2 % (ref 39.0–52.0)
HEMOGLOBIN: 17 g/dL (ref 13.0–17.0)
MCH: 29.2 pg (ref 26.0–34.0)
MCHC: 33.9 g/dL (ref 30.0–36.0)
MCV: 86.3 fL (ref 78.0–100.0)
PLATELETS: 267 10*3/uL (ref 150–400)
RBC: 5.82 MIL/uL — AB (ref 4.22–5.81)
RDW: 13.5 % (ref 11.5–15.5)
WBC: 10.3 10*3/uL (ref 4.0–10.5)

## 2016-03-25 LAB — SALICYLATE LEVEL

## 2016-03-25 LAB — RAPID URINE DRUG SCREEN, HOSP PERFORMED
AMPHETAMINES: NOT DETECTED
BENZODIAZEPINES: NOT DETECTED
Barbiturates: NOT DETECTED
Cocaine: POSITIVE — AB
OPIATES: NOT DETECTED
TETRAHYDROCANNABINOL: POSITIVE — AB

## 2016-03-25 LAB — ACETAMINOPHEN LEVEL: Acetaminophen (Tylenol), Serum: <10 ug/mL — ABNORMAL LOW (ref 10–30)

## 2016-03-25 LAB — ETHANOL: Alcohol, Ethyl (B): <5 mg/dL (ref <5)

## 2016-03-25 MED ORDER — NICOTINE 21 MG/24HR TD PT24
21.0000 mg | MEDICATED_PATCH | Freq: Every day | TRANSDERMAL | Status: DC
  Administered 2016-03-25 – 2016-03-26 (×2): 21 mg via TRANSDERMAL
  Filled 2016-03-25 (×2): qty 1

## 2016-03-25 MED ORDER — NICOTINE 21 MG/24HR TD PT24
21.0000 mg | MEDICATED_PATCH | Freq: Once | TRANSDERMAL | Status: DC
Start: 2016-03-25 — End: 2016-03-25

## 2016-03-25 MED ORDER — NICOTINE 21 MG/24HR TD PT24: 21.0000 mg | MEDICATED_PATCH | Freq: Once | TRANSDERMAL | Status: DC

## 2016-03-25 NOTE — ED Triage Notes (Signed)
Pt reports suicidal ideation. Denies HI. Has been having auditory hallucinations at home. Has also been extra anxious.

## 2016-03-25 NOTE — ED Notes (Signed)
Patient continues to approach by patient in room 38. Patient continues to show self-control and self -restraint. Staff continues to re-direct room 38 away from patient while he's on phone. Encouragement and support provided and safety maintain. Q 15 min safety checks remain in place.

## 2016-03-25 NOTE — ED Notes (Signed)
On approach, patient is polite and cooperative.  He is quiet and has been out of his room some this afternoon.  He is interacting well with peers.
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
## 2016-03-25 NOTE — ED Notes (Signed)
Patient present to ED with SI and reports auditory hallucinations. Patient currently is deny SI, HI and AVH at this time. Patient does appear anxious and paranoid at this time. Plan of care discussed with patient. Patient voices no complaints concerns at this time. Encouragement and support provided and safety maintain. Q 15 min safety checks remain in place and video monitoring.

## 2016-03-25 NOTE — Progress Notes (Signed)
EDCM went to speak to  patient at bedside, however patient is not in his room . Patient listed asnot having a pcp or insurance living in [place].  [facility] provided patient with pamphlet to [facility], informed patient of services there and walk in times.  EDCM also provided patient with list of pcps who accept self pay patients, list of discount pharmacies and websites needymeds.org and GoodRX.com for medication assistance, phone number to inquire about the orange card, phone number to inquire about Mediciad, phone number to inquire about the Affordable Care Act, financial resources in the community such as local churches, salvation army, urban ministries, and dental assistance for uninsured patients.  Contact information for the [facility] [place] provided as well.  This information was placed on patient's bedside table.   No further EDCM needs at this time.

## 2016-03-25 NOTE — ED Notes (Signed)
Bed: WBH42 Expected date:  Expected time:  Means of arrival:  Comments: Triage 5 

## 2016-03-26 ENCOUNTER — Encounter (HOSPITAL_COMMUNITY): Payer: Self-pay | Admitting: Emergency Medicine

## 2016-03-26 DIAGNOSIS — F1414 Cocaine abuse with cocaine-induced mood disorder: Secondary | ICD-10-CM | POA: Diagnosis present
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
## 2016-03-26 NOTE — Discharge Instructions (Addendum)
For your ongoing behavioral health needs you are advised to follow up with one of the following treatment providers:       [facility]      [address]      [place], [place] [address]      [phone]      New and returning patients are seen at their walk-in clinic.  Walk-in hours are Monday - Friday from 8:00 am - 3:00 pm.  Walk-in patients are seen on a first come, first served basis.  Try to arrive as early as possible for he best chance of being seen the same day.       [facility] [place]      [address]      [place], [place] [address]      [phone]      New patients are seen at their walk-in clinic.  Walk-in hours are Monday - Friday from 8:00 am - 12:00 pm, and from 1:00 pm - 3:00 pm.  Walk-in patients are seen on a first come, first served basis, so try to arrive as early as possible for the best chance of being seen the same day.  There is an initial fee of $22.50.

## 2016-03-26 NOTE — BH Assessment (Addendum)
[facility] Assessment Progress Note  Per Thedore Mins, MD, this pt does not require psychiatric hospitalization at this time.  Pt is to be discharged from [facility] with recommendation to follow up with [facility].  This has been included in pt's discharge instructions.  Pt's nurse has been notified.  Doylene Canning, MA Triage Specialist [phone]   Addendum:  Pt requests referral information for alternative treatment providers.  Contact information for [facility] has been included in pt's discharge instructions.  Pt's nurse has been notified.  Doylene Canning, MA Triage Specialist [phone]
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
## 2016-03-26 NOTE — ED Notes (Signed)
Pt was discharged per order. He denied SI/HI/AVH/pain and verbalized readiness for discharge. He appeared in no acute distress. Patient was given AVS. Discharged instructions were reviewed. Additional option of [facility] follow-up was obtained after pt said he couldn't afford $22.50 fee at [facility]. Pt was given bus pass. [facility] escorted pt to lobby after pt signed for belongings.

## 2016-09-28 ENCOUNTER — Emergency Department (HOSPITAL_COMMUNITY)
Admission: EM | Admit: 2016-09-28 | Discharge: 2016-09-30 | Disposition: A | Discharge status: Home / Self Care | Payer: Federal, State, Local not specified - Other | Attending: Emergency Medicine | Admitting: Emergency Medicine

## 2016-09-28 ENCOUNTER — Encounter (HOSPITAL_COMMUNITY): Payer: Self-pay | Admitting: Emergency Medicine

## 2016-09-28 DIAGNOSIS — R45851 Suicidal ideations: Secondary | ICD-10-CM

## 2016-09-28 DIAGNOSIS — F1414 Cocaine abuse with cocaine-induced mood disorder: Secondary | ICD-10-CM | POA: Diagnosis present

## 2016-09-28 DIAGNOSIS — F332 Major depressive disorder, recurrent severe without psychotic features: Secondary | ICD-10-CM | POA: Insufficient documentation

## 2016-09-28 DIAGNOSIS — F17290 Nicotine dependence, other tobacco product, uncomplicated: Secondary | ICD-10-CM | POA: Insufficient documentation

## 2016-09-28 DIAGNOSIS — Z5181 Encounter for therapeutic drug level monitoring: Secondary | ICD-10-CM | POA: Insufficient documentation

## 2016-09-28 LAB — COMPREHENSIVE METABOLIC PANEL
ALT: 20 U/L (ref 17–63)
AST: 30 U/L (ref 15–41)
Albumin: 4.5 g/dL (ref 3.5–5.0)
Alkaline Phosphatase: 59 U/L (ref 38–126)
Anion gap: 8 (ref 5–15)
BUN: 15 mg/dL (ref 6–20)
CO2: 24 mmol/L (ref 22–32)
CREATININE: 1.23 mg/dL (ref 0.61–1.24)
Calcium: 9.4 mg/dL (ref 8.9–10.3)
Chloride: 107 mmol/L (ref 101–111)
GFR calc Af Amer: >60 mL/min (ref >60)
Glucose, Bld: 123 mg/dL — ABNORMAL HIGH (ref 65–99)
POTASSIUM: 3.5 mmol/L (ref 3.5–5.1)
Sodium: 139 mmol/L (ref 135–145)
TOTAL PROTEIN: 7.7 g/dL (ref 6.5–8.1)
Total Bilirubin: 0.6 mg/dL (ref 0.3–1.2)

## 2016-09-28 LAB — CBC
HCT: 45.2 % (ref 39.0–52.0)
Hemoglobin: 14.9 g/dL (ref 13.0–17.0)
MCH: 27.7 pg (ref 26.0–34.0)
MCHC: 33 g/dL (ref 30.0–36.0)
MCV: 84 fL (ref 78.0–100.0)
PLATELETS: 250 10*3/uL (ref 150–400)
RBC: 5.38 MIL/uL (ref 4.22–5.81)
RDW: 13.9 % (ref 11.5–15.5)
WBC: 9.7 10*3/uL (ref 4.0–10.5)

## 2016-09-28 LAB — RAPID URINE DRUG SCREEN, HOSP PERFORMED
Amphetamines: NOT DETECTED
Barbiturates: NOT DETECTED
Benzodiazepines: NOT DETECTED
Cocaine: POSITIVE — AB
Opiates: NOT DETECTED
Tetrahydrocannabinol: POSITIVE — AB

## 2016-09-28 LAB — ACETAMINOPHEN LEVEL: Acetaminophen (Tylenol), Serum: <10 ug/mL — ABNORMAL LOW (ref 10–30)

## 2016-09-28 LAB — SALICYLATE LEVEL: Salicylate Lvl: <7 mg/dL (ref 2.8–30.0)

## 2016-09-28 LAB — ETHANOL

## 2016-09-28 MED ORDER — LORAZEPAM 1 MG PO TABS
1.0000 mg | ORAL_TABLET | ORAL | Status: DC | PRN
Start: 2016-09-28 — End: 2016-09-30

## 2016-09-28 NOTE — ED Notes (Signed)
Bed: WA29 Expected date:  Expected time:  Means of arrival:  Comments: Pt coming with [employer]

## 2016-09-28 NOTE — BH Assessment (Addendum)
Tele Assessment Note   Stuart Rivers is a 26 y.o. male who presents voluntarily to [facility] by [facility]. Pt states he has been suffering from depression for a long time. Pt states currently having suicidal thoughts of running into traffic. Pt also stated that he is having on and off homicidal thoughts. He could not give specifics on who he his having homicidal thoughts towards.  Pt reports he has a past history of cutting his wrists to cope with his depression. He states he does not sleep or eat well consistently. Pt states he has been hearing voices for the past few months. Pt is not receiving medication management nor outpatient therapy. Pt states he has never been admitted inpatient before. Pt states he currently using cocaine and marijuana daily. No prior SA treatment.   Pt reports that he lives with his grandmother and has tension with her. He reports not having any other family support. Pt states he is also having issues with his current girlfriend. Pt cannot contract for safety.   Pt was dressed in hospital scrubs. Pt is alert and oriented X4.  Pt's mood was depressed. Pt was not responding to any internal stimuli. Pt's thought process was impaired.   Diagnosis: MDD,Recurrrent, Severe Cocaine abuse with cocaine-induced mood disorder; Cannabis Use disorder (per history)   Past Medical History:  Past Medical History:  Diagnosis Date  . Anxiety   . Depression   . No pertinent past medical history     Past Surgical History:  Procedure Laterality Date  . CLOSED REDUCTION NASAL FRACTURE  04/07/2012   Procedure: CLOSED REDUCTION NASAL FRACTURE;  Surgeon: Wayland Denis, DO;  Location: [facility] OR;  Service: Plastics;  Laterality: N/A;  . NO PAST SURGERIES    . ORIF TIBIA FRACTURE  04/05/2012   Procedure: OPEN REDUCTION INTERNAL FIXATION (ORIF) TIBIA FRACTURE;  Surgeon: Shelda Pal, MD;  Location: [facility] OR;  Service: Orthopedics;  Laterality: Left;    Family History:  Family History  Problem Relation  Age of Onset  . Arthritis      grandparent  . Breast cancer Other   . Heart disease      grandparent  . Hypertension      grandparent  . Diabetes      parent  . Diabetes      grandparent    Social History:  reports that he has been smoking Cigars.  He has never used smokeless tobacco. He reports that he drinks alcohol. He reports that he uses drugs, including Marijuana and "Crack" cocaine.  Additional Social History:  Alcohol / Drug Use Pain Medications: pt denies abuse - see pta meds list Prescriptions: pt denies abuse - see pta meds list Over the Counter: pt denies abuse - see pta meds list History of alcohol / drug use?: Yes Substance #1 Name of Substance 1: Cocaine  1 - Age of First Use: 22 1 - Amount (size/oz): " a lot but does not know specific amount" 1 - Frequency: daily 1 - Duration: ongoing 1 - Last Use / Amount: 2/218 Substance #2 Name of Substance 2: Cannabis  2 - Age of First Use: 11 2 - Amount (size/oz): 3-4 blunts 2 - Frequency: daily 2 - Duration: ongoing  2 - Last Use / Amount: 09/28/16  CIWA: CIWA-Ar BP: 117/70 Pulse Rate: 74 COWS:    PATIENT STRENGTHS: (choose at least two) Ability for insight Average or above average intelligence Capable of independent living Communication skills General fund of knowledge Motivation for  treatment/growth Physical Health  Allergies: No Known Allergies  Home Medications:  (Not in a hospital admission)  OB/GYN Status:  No LMP for male patient.  General Assessment Data Location of Assessment: [facility] TTS Assessment: In system Is this a Tele or Face-to-Face Assessment?: Face-to-Face Is this an Initial Assessment or a Re-assessment for this encounter?: Initial Assessment Marital status: Long term relationship Living Arrangements: Other relatives, Spouse/significant other Can pt return to current living arrangement?: Yes Admission Status: Voluntary Is patient capable of signing voluntary admission?:  Yes Referral Source: Self/Family/Friend Insurance type: none   Medical Screening Exam [facility] Walk-in ONLY) Medical Exam completed: Yes  Crisis Care Plan Living Arrangements: Other relatives, Spouse/significant other Name of Psychiatrist: none  Name of Therapist: none  Education Status Is patient currently in school?: No Current Grade: na Highest grade of school patient has completed: unknown  Name of school: na Contact person: na  Risk to self with the past 6 months Suicidal Ideation: Yes-Currently Present Has patient been a risk to self within the past 6 months prior to admission? : No Suicidal Intent: Yes-Currently Present Has patient had any suicidal intent within the past 6 months prior to admission? : No Is patient at risk for suicide?: Yes Suicidal Plan?: Yes-Currently Present Has patient had any suicidal plan within the past 6 months prior to admission? : No Specify Current Suicidal Plan: run in front of traffic Access to Means: Yes Specify Access to Suicidal Means: walking into traffic What has been your use of drugs/alcohol within the last 12 months?: Cocaine and Cannabis Previous Attempts/Gestures: Yes How many times?: 1 Other Self Harm Risks: cuts writs Triggers for Past Attempts: Unpredictable Intentional Self Injurious Behavior: Cutting Comment - Self Injurious Behavior: cuts writs Family Suicide History: Unknown Recent stressful life event(s): Conflict (Comment) Persecutory voices/beliefs?: No Depression: Yes Depression Symptoms: Insomnia, Tearfulness, Isolating, Feeling worthless/self pity, Feeling angry/irritable Substance abuse history and/or treatment for substance abuse?: Yes  Risk to Others within the past 6 months Homicidal Ideation: Yes-Currently Present Does patient have any lifetime risk of violence toward others beyond the six months prior to admission? : Unknown Thoughts of Harm to Others: Yes-Currently Present Comment - Thoughts of Harm to  Others: pt reports having on and off thoughts of hurting random people Current Homicidal Intent: Yes-Currently Present Current Homicidal Plan: No Access to Homicidal Means: No Identified Victim: unknown  History of harm to others?: No Assessment of Violence: On admission Violent Behavior Description: thoughts of harm towards others Does patient have access to weapons?: No Criminal Charges Pending?: No Does patient have a court date: No Is patient on probation?: No  Psychosis Hallucinations: Auditory Delusions: None noted  Mental Status Report Appearance/Hygiene: In scrubs Eye Contact: Fair Motor Activity: Freedom of movement Speech: Logical/coherent Level of Consciousness: Alert Mood: Depressed Affect: Depressed Anxiety Level: None Thought Processes: Coherent Judgement: Impaired Orientation: Person, Time, Place, Situation, Appropriate for developmental age Obsessive Compulsive Thoughts/Behaviors: None  Cognitive Functioning Concentration: Fair Memory: Recent Intact, Remote Intact IQ: Average Insight: Fair Impulse Control: Good Appetite: Fair Weight Loss: 0 Weight Gain: 0 Sleep: Decreased Total Hours of Sleep: 5 Vegetative Symptoms: None  ADLScreening [facility] Assessment Services) Patient's cognitive ability adequate to safely complete daily activities?: Yes Patient able to express need for assistance with ADLs?: Yes Independently performs ADLs?: Yes (appropriate for developmental age)  Prior Inpatient Therapy Prior Inpatient Therapy: No Prior Therapy Dates: na Prior Therapy Facilty/Provider(s): na Reason for Treatment: na  Prior Outpatient Therapy Prior Outpatient Therapy: No  Prior Therapy Dates: na Prior Therapy Facilty/Provider(s): na Reason for Treatment: na Does patient have an ACCT team?: No Does patient have Intensive In-House Services?  : No Does patient have [facility] services? : No Does patient have [facility] services?: No  ADL Screening (condition at  time of admission) Patient's cognitive ability adequate to safely complete daily activities?: Yes Patient able to express need for assistance with ADLs?: Yes Independently performs ADLs?: Yes (appropriate for developmental age)       Abuse/Neglect Assessment (Assessment to be complete while patient is alone) Physical Abuse: Denies Verbal Abuse: Denies Sexual Abuse: Denies Exploitation of patient/patient's resources: Denies Self-Neglect: Denies Values / Beliefs Cultural Requests During Hospitalization: None Spiritual Requests During Hospitalization: None   Advance Directives (For Healthcare) Does Patient Have a Medical Advance Directive?: No Would patient like information on creating a medical advance directive?: No - Patient declined    Additional Information 1:1 In Past 12 Months?: No CIRT Risk: No Elopement Risk: No Does patient have medical clearance?: Yes     Disposition: Gave clinical report to Nira ConnJason Berry, FNP who states pt meets inpatient criteria. [facility] per Chyrl CivatteJoAnn, Ac. TTS will seek placement.     Morrie Sheldonshley n Arie SabinaBlanton 09/28/2016 10:35 PM
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
## 2016-09-28 NOTE — ED Triage Notes (Signed)
Patient from his grandparents house who reports he has been using crack cocaine for the last several months and today is feeling depressed with feelings of suicide.  Patient has a plan to walk out [place] front [place].  Denies homicidal thoughts.

## 2016-09-28 NOTE — ED Notes (Signed)
ED Provider at bedside. 

## 2016-09-29 ENCOUNTER — Encounter (HOSPITAL_COMMUNITY): Payer: Self-pay | Admitting: Registered Nurse

## 2016-09-29 DIAGNOSIS — Z9889 Other specified postprocedural states: Secondary | ICD-10-CM

## 2016-09-29 DIAGNOSIS — Z803 Family history of malignant neoplasm of breast: Secondary | ICD-10-CM

## 2016-09-29 DIAGNOSIS — F1290 Cannabis use, unspecified, uncomplicated: Secondary | ICD-10-CM

## 2016-09-29 DIAGNOSIS — F1414 Cocaine abuse with cocaine-induced mood disorder: Secondary | ICD-10-CM | POA: Diagnosis not present

## 2016-09-29 DIAGNOSIS — Z8261 Family history of arthritis: Secondary | ICD-10-CM

## 2016-09-29 DIAGNOSIS — F332 Major depressive disorder, recurrent severe without psychotic features: Secondary | ICD-10-CM

## 2016-09-29 DIAGNOSIS — F333 Major depressive disorder, recurrent, severe with psychotic symptoms: Secondary | ICD-10-CM | POA: Diagnosis not present

## 2016-09-29 DIAGNOSIS — F17210 Nicotine dependence, cigarettes, uncomplicated: Secondary | ICD-10-CM

## 2016-09-29 DIAGNOSIS — R45851 Suicidal ideations: Secondary | ICD-10-CM

## 2016-09-29 DIAGNOSIS — Z833 Family history of diabetes mellitus: Secondary | ICD-10-CM

## 2016-09-29 DIAGNOSIS — Z8249 Family history of ischemic heart disease and other diseases of the circulatory system: Secondary | ICD-10-CM

## 2016-09-29 MED ORDER — GABAPENTIN 100 MG PO CAPS
200.0000 mg | ORAL_CAPSULE | Freq: Three times a day (TID) | ORAL | Status: DC
  Administered 2016-09-29 (×2): 200 mg via ORAL
  Filled 2016-09-29 (×2): qty 2

## 2016-09-29 MED ORDER — FLUOXETINE HCL 20 MG PO CAPS
20.0000 mg | ORAL_CAPSULE | Freq: Every day | ORAL | Status: DC
  Administered 2016-09-29: 20 mg via ORAL
  Filled 2016-09-29: qty 1

## 2016-09-29 NOTE — ED Notes (Signed)
Pt dc to [facility] via [facility] all belongings given to [employer]

## 2016-09-29 NOTE — ED Notes (Signed)
Patient reports ongoing depression r/t family discord, unemployment, and GF breaking up with him.  "I just want to die".  Affect is incongruent with stated mood. Allowed to ventilate feelings.  Snack offered. 15' checks initiated.

## 2016-09-29 NOTE — ED Notes (Signed)
[employer] notified for transport., [facility]. Pt aware of transport but currently asleep.
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
## 2016-09-29 NOTE — Progress Notes (Signed)
CSW filed patient's examination paperwork into IVC logbook.  

## 2016-09-29 NOTE — ED Notes (Signed)
SBAR Report received from previous nurse. Pt received calm and visible on unit. Pt denies current SI/ HI, A/V H, depression, anxiety, or pain at this time, and appears otherwise stable and free of distress. Pt reminded of camera surveillance, q 15 min rounds, and rules of the milieu. Will continue to assess. 

## 2016-09-29 NOTE — BH Assessment (Signed)
[facility]  09/29/16: Accepted to [phone] per Kenney HousemanLindsay AC paperwork needs to be completed.

## 2016-09-30 ENCOUNTER — Inpatient Hospital Stay (HOSPITAL_COMMUNITY)
Admission: AD | Admit: 2016-09-30 | Discharge: 2016-10-09 | DRG: 885 | Disposition: A | Discharge status: Home / Self Care | Payer: Federal, State, Local not specified - Other | Source: Intra-hospital | Attending: Psychiatry | Admitting: Psychiatry

## 2016-09-30 ENCOUNTER — Encounter (HOSPITAL_COMMUNITY): Payer: Self-pay | Admitting: Emergency Medicine

## 2016-09-30 DIAGNOSIS — Z9889 Other specified postprocedural states: Secondary | ICD-10-CM | POA: Diagnosis not present

## 2016-09-30 DIAGNOSIS — F332 Major depressive disorder, recurrent severe without psychotic features: Secondary | ICD-10-CM | POA: Diagnosis present

## 2016-09-30 DIAGNOSIS — F1420 Cocaine dependence, uncomplicated: Secondary | ICD-10-CM | POA: Diagnosis not present

## 2016-09-30 DIAGNOSIS — F1290 Cannabis use, unspecified, uncomplicated: Secondary | ICD-10-CM | POA: Diagnosis present

## 2016-09-30 DIAGNOSIS — F17290 Nicotine dependence, other tobacco product, uncomplicated: Secondary | ICD-10-CM | POA: Diagnosis present

## 2016-09-30 DIAGNOSIS — F411 Generalized anxiety disorder: Secondary | ICD-10-CM | POA: Diagnosis present

## 2016-09-30 DIAGNOSIS — R45851 Suicidal ideations: Secondary | ICD-10-CM | POA: Diagnosis present

## 2016-09-30 DIAGNOSIS — Z915 Personal history of self-harm: Secondary | ICD-10-CM | POA: Diagnosis not present

## 2016-09-30 DIAGNOSIS — F1220 Cannabis dependence, uncomplicated: Secondary | ICD-10-CM | POA: Diagnosis not present

## 2016-09-30 DIAGNOSIS — F4310 Post-traumatic stress disorder, unspecified: Secondary | ICD-10-CM

## 2016-09-30 DIAGNOSIS — F1414 Cocaine abuse with cocaine-induced mood disorder: Secondary | ICD-10-CM | POA: Diagnosis present

## 2016-09-30 DIAGNOSIS — F333 Major depressive disorder, recurrent, severe with psychotic symptoms: Secondary | ICD-10-CM | POA: Diagnosis not present

## 2016-09-30 DIAGNOSIS — G4700 Insomnia, unspecified: Secondary | ICD-10-CM | POA: Diagnosis present

## 2016-09-30 DIAGNOSIS — F1994 Other psychoactive substance use, unspecified with psychoactive substance-induced mood disorder: Secondary | ICD-10-CM

## 2016-09-30 DIAGNOSIS — Z79899 Other long term (current) drug therapy: Secondary | ICD-10-CM | POA: Diagnosis not present

## 2016-09-30 DIAGNOSIS — F29 Unspecified psychosis not due to a substance or known physiological condition: Secondary | ICD-10-CM | POA: Diagnosis not present

## 2016-09-30 MED ORDER — RISPERIDONE 0.5 MG PO TABS
0.5000 mg | ORAL_TABLET | Freq: Every day | ORAL | Status: DC
  Administered 2016-09-30: 0.5 mg via ORAL
  Filled 2016-09-30 (×3): qty 1

## 2016-09-30 MED ORDER — ACETAMINOPHEN 325 MG PO TABS
650.0000 mg | ORAL_TABLET | Freq: Four times a day (QID) | ORAL | Status: DC | PRN
  Administered 2016-10-03 – 2016-10-08 (×3): 650 mg via ORAL
  Filled 2016-09-30 (×3): qty 2

## 2016-09-30 MED ORDER — HYDROXYZINE HCL 25 MG PO TABS
25.0000 mg | ORAL_TABLET | Freq: Three times a day (TID) | ORAL | Status: DC | PRN
  Administered 2016-10-01 – 2016-10-08 (×7): 25 mg via ORAL
  Filled 2016-09-30: qty 10
  Filled 2016-09-30 (×5): qty 1

## 2016-09-30 MED ORDER — SERTRALINE HCL 25 MG PO TABS
25.0000 mg | ORAL_TABLET | Freq: Once | ORAL | Status: AC
  Administered 2016-09-30: 25 mg via ORAL
  Filled 2016-09-30 (×2): qty 1

## 2016-09-30 MED ORDER — ALUM & MAG HYDROXIDE-SIMETH 200-200-20 MG/5ML PO SUSP: 30.0000 mL | ORAL | Status: DC | PRN

## 2016-09-30 MED ORDER — MAGNESIUM HYDROXIDE 400 MG/5ML PO SUSP: 30.0000 mL | Freq: Every day | ORAL | Status: DC | PRN

## 2016-09-30 MED ORDER — TRAZODONE HCL 50 MG PO TABS
50.0000 mg | ORAL_TABLET | Freq: Every evening | ORAL | Status: DC | PRN
  Administered 2016-09-30 – 2016-10-03 (×4): 50 mg via ORAL
  Filled 2016-09-30 (×4): qty 1

## 2016-09-30 MED ORDER — TRAZODONE HCL 50 MG PO TABS: 50.0000 mg | ORAL_TABLET | Freq: Every evening | ORAL | Status: DC | PRN

## 2016-09-30 MED ORDER — SERTRALINE HCL 50 MG PO TABS
50.0000 mg | ORAL_TABLET | Freq: Every day | ORAL | Status: DC
  Administered 2016-10-01 – 2016-10-09 (×9): 50 mg via ORAL
  Filled 2016-09-30: qty 1
  Filled 2016-09-30: qty 7
  Filled 2016-09-30 (×10): qty 1

## 2016-09-30 NOTE — Tx Team (Signed)
Interdisciplinary Treatment and Diagnostic Plan Update  10/01/2016 Time of Session: 9:30AM Stuart Rivers MRN: [id number]  Principal Diagnosis: Substance induced mood disorder (HCC)  Secondary Diagnoses: Principal Problem:   Substance induced mood disorder (HCC) Active Problems:   Recurrent major depression-severe (HCC)   PTSD (post-traumatic stress disorder)   Current Medications:  Current Facility-Administered Medications  Medication Dose Route Frequency Provider Last Rate Last Dose  . acetaminophen (TYLENOL) tablet 650 mg  650 mg Oral Q6H PRN Jackelyn Poling, NP      . alum & mag hydroxide-simeth (MAALOX/MYLANTA) 200-200-20 MG/5ML suspension 30 mL  30 mL Oral Q4H PRN Jackelyn Poling, NP      . hydrOXYzine (ATARAX/VISTARIL) tablet 25 mg  25 mg Oral TID PRN Jackelyn Poling, NP      . magnesium hydroxide (MILK OF MAGNESIA) suspension 30 mL  30 mL Oral Daily PRN Jackelyn Poling, NP      . QUEtiapine (SEROQUEL) tablet 25 mg  25 mg Oral QHS Adonis Brook, NP      . sertraline (ZOLOFT) tablet 50 mg  50 mg Oral Daily Georgiann Cocker, MD   50 mg at 10/01/16 0819  . traZODone (DESYREL) tablet 50 mg  50 mg Oral QHS PRN Adonis Brook, NP   50 mg at 09/30/16 2142   PTA Medications: Prescriptions Prior to Admission  Medication Sig Dispense Refill Last Dose  . sertraline (ZOLOFT) 50 MG tablet Take 1.5 tablets (75 mg total) by mouth daily. (Patient not taking: Reported on 03/25/2016) 45 tablet 3 Not Taking at Unknown time    Patient Stressors: Financial difficulties Substance abuse  Patient Strengths: Ability for insight Motivation for treatment/growth Supportive family/friends  Treatment Modalities: Medication Management, Group therapy, Case management,  1 to 1 session with clinician, Psychoeducation, Recreational therapy.   Physician Treatment Plan for Primary Diagnosis: Substance induced mood disorder (HCC) Long Term Goal(s): Improvement in symptoms so as ready for  discharge Improvement in symptoms so as ready for discharge   Short Term Goals: Ability to identify changes in lifestyle to reduce recurrence of condition will improve Ability to verbalize feelings will improve Ability to demonstrate self-control will improve Ability to identify and develop effective coping behaviors will improve Ability to maintain clinical measurements within normal limits will improve Compliance with prescribed medications will improve Ability to identify triggers associated with substance abuse/mental health issues will improve Ability to identify changes in lifestyle to reduce recurrence of condition will improve Ability to verbalize feelings will improve Ability to disclose and discuss suicidal ideas Ability to demonstrate self-control will improve Ability to identify and develop effective coping behaviors will improve Ability to maintain clinical measurements within normal limits will improve Compliance with prescribed medications will improve Ability to identify triggers associated with substance abuse/mental health issues will improve  Medication Management: Evaluate patient's response, side effects, and tolerance of medication regimen.  Therapeutic Interventions: 1 to 1 sessions, Unit Group sessions and Medication administration.  Evaluation of Outcomes: Progressing  Physician Treatment Plan for Secondary Diagnosis: Principal Problem:   Substance induced mood disorder (HCC) Active Problems:   Recurrent major depression-severe (HCC)   PTSD (post-traumatic stress disorder)  Long Term Goal(s): Improvement in symptoms so as ready for discharge Improvement in symptoms so as ready for discharge   Short Term Goals: Ability to identify changes in lifestyle to reduce recurrence of condition will improve Ability to verbalize feelings will improve Ability to demonstrate self-control will improve Ability to identify and develop effective coping behaviors  will  improve Ability to maintain clinical measurements within normal limits will improve Compliance with prescribed medications will improve Ability to identify triggers associated with substance abuse/mental health issues will improve Ability to identify changes in lifestyle to reduce recurrence of condition will improve Ability to verbalize feelings will improve Ability to disclose and discuss suicidal ideas Ability to demonstrate self-control will improve Ability to identify and develop effective coping behaviors will improve Ability to maintain clinical measurements within normal limits will improve Compliance with prescribed medications will improve Ability to identify triggers associated with substance abuse/mental health issues will improve     Medication Management: Evaluate patient's response, side effects, and tolerance of medication regimen.  Therapeutic Interventions: 1 to 1 sessions, Unit Group sessions and Medication administration.  Evaluation of Outcomes: Progressing   RN Treatment Plan for Primary Diagnosis: Substance induced mood disorder (HCC) Long Term Goal(s): Knowledge of disease and therapeutic regimen to maintain health will improve  Short Term Goals: Ability to remain free from injury will improve, Ability to disclose and discuss suicidal ideas and Ability to identify and develop effective coping behaviors will improve  Medication Management: RN will administer medications as ordered by provider, will assess and evaluate patient's response and provide education to patient for prescribed medication. RN will report any adverse and/or side effects to prescribing provider.  Therapeutic Interventions: 1 on 1 counseling sessions, Psychoeducation, Medication administration, Evaluate responses to treatment, Monitor vital signs and CBGs as ordered, Perform/monitor CIWA, COWS, AIMS and Fall Risk screenings as ordered, Perform wound care treatments as ordered.  Evaluation of  Outcomes: Progressing   LCSW Treatment Plan for Primary Diagnosis: Substance induced mood disorder (HCC) Long Term Goal(s): Safe transition to appropriate next level of care at discharge, Engage patient in therapeutic group addressing interpersonal concerns.  Short Term Goals: Engage patient in aftercare planning with referrals and resources, Facilitate patient progression through stages of change regarding substance use diagnoses and concerns and Identify triggers associated with mental health/substance abuse issues  Therapeutic Interventions: Assess for all discharge needs, 1 to 1 time with Social worker, Explore available resources and support systems, Assess for adequacy in community support network, Educate family and significant other(s) on suicide prevention, Complete Psychosocial Assessment, Interpersonal group therapy.  Evaluation of Outcomes: Progressing   Progress in Treatment: Attending groups: No. New to unit .Continuing to assess.  Participating in groups: No. Taking medication as prescribed: Yes. Toleration medication: Yes. Family/Significant other contact made: No, will contact:  family member if patient consents.  Patient understands diagnosis: Yes. Discussing patient identified problems/goals with staff: Yes. Medical problems stabilized or resolved: Yes. Denies suicidal/homicidal ideation: No. Pt continues to endorse passive SI/able to contract for safety on the unit.  Issues/concerns per patient self-inventory: No. Other: n/a   New problem(s) identified: No, Describe:  n/a  New Short Term/Long Term Goal(s): medication stabilization, detox; development of comprehensive mental wellness/sobriety plan.   Discharge Plan or Barriers: CSW assessing for appropriate referrals. Possibly [facility]--[place].   Reason for Continuation of Hospitalization: Depression Medication stabilization Suicidal ideation Withdrawal symptoms  Estimated Length of Stay: 3-5  days   Attendees: Patient: 10/01/2016 4:05 PM  Physician: Dr.Izediuno MD 10/01/2016 4:05 PM  Nursing: Marion DownerJane, Elizabeth RN 10/01/2016 4:05 PM  RN Care Manager: Onnie BoerJennifer Clark CM 10/01/2016 4:05 PM  Social Worker: Herbert SetaHeather Smart, LCSW; Vernie ShanksLauren Newton LCSW 10/01/2016 4:05 PM  Recreational Therapist: Juliann ParesX 10/01/2016 4:05 PM  Other: Gray BernhardtMay Augustin NP; Hillery Jacksanika Lewis NP 10/01/2016 4:05 PM  Other:  10/01/2016 4:05  PM  Other: 10/01/2016 4:05 PM    Scribe for Treatment Team: Ledell Peoples Smart, LCSW 10/01/2016 4:05 PM

## 2016-09-30 NOTE — Tx Team (Signed)
Initial Treatment Plan 09/30/2016 12:47 AM Stuart Rivers [id number]    PATIENT STRESSORS: Financial difficulties Substance abuse   PATIENT STRENGTHS: Ability for insight Motivation for treatment/growth Supportive family/friends   PATIENT IDENTIFIED PROBLEMS: "Anxiety"  "depression"  Suicidal thoughts  Substance Abuse               DISCHARGE CRITERIA:  Improved stabilization in mood, thinking, and/or behavior Motivation to continue treatment in a less acute level of care  PRELIMINARY DISCHARGE PLAN: Return to previous living arrangement  PATIENT/FAMILY INVOLVEMENT: This treatment plan has been presented to and reviewed with the patient, Stuart Rivers.  The patient and family have been given the opportunity to ask questions and make suggestions.  Curly RimBarbara B Dilon Lank, RN 09/30/2016, 12:47 AM

## 2016-09-30 NOTE — Progress Notes (Signed)
Recreation Therapy Notes  Date: 09/30/16 Time: 0930 Location: [address] Dayroom  Group Topic: Stress Management  Goal Area(s) Addresses:  Patient will verbalize importance of using healthy stress management.  Patient will identify positive emotions associated with healthy stress management.   Intervention: Stress Management  Activity :  Meditation.  LRT introduced the stress management technique of meditation.  LRT played a meditation from the Calm app to give patients the opportunity to engage in the activity.  Patients were to follow along as LRT played the meditation.  Education:  Stress Management, Discharge Planning.   Education Outcome: Acknowledges edcuation/In group clarification offered/Needs additional education  Clinical Observations/Feedback: Pt did not attend group.    Caroll RancherMarjette Milany Geck, LRT/CTRS         Lillia AbedLindsay, Kaisei Gilbo A 09/30/2016 3:28 PM

## 2016-09-30 NOTE — Progress Notes (Signed)
Admission Note:  [age] yr male who presents voluntary in no acute distress for the treatment of SI and Depression.  Pt was calm and cooperative with admission process. Pt. Has passive SI but contracts for safety.   Pt denies AVH . Patient states he has relationship problems in which his girlfriend called and said she was not coming back to him and he states he does not know why. Patient lives with grandmother and states she is his only support system. Skin was assessed and found to be clear of any abnormal marks. PT searched and no contraband found, POC and unit policies explained and understanding verbalized. Consents obtained. Food and fluids offered, and fluids accepted. Pt had no additional questions or concerns.
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
## 2016-09-30 NOTE — Progress Notes (Signed)
DAR NOTE: Patient presents with anxious affect and  mood.  Denies pain, auditory and visual hallucinations.  Reports suicidal thoughts on self inventory form but contracts for safety.  Described energy level as low and concentration as poor.  Rates depression at 10, hopelessness at 9, and anxiety at 9.  Maintained on routine safety checks.  Medications given as prescribed.  Support and encouragement offered as needed.  Attended group and participated.  States goal for today is "focusing and being stable."  Patient observed socializing with peers in the dayroom.  Offered no complaint.

## 2016-09-30 NOTE — Progress Notes (Signed)
The patient attended the evening A.A. Meeting and was appropriate. He initially had refused to attend the session.
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
## 2016-09-30 NOTE — [facility] Group Notes (Signed)
Pt attended spiritual care group on grief and loss facilitated by chaplain Lauryl Seyer   Group opened with brief discussion and psycho-social ed around grief and loss in relationships and in relation to self - identifying life patterns, circumstances, changes that cause losses. Established group norm of speaking from own life experience. Group goal of establishing open and affirming space for members to share loss and experience with grief, normalize grief experience and provide psycho social education and grief support.            Stuart Rivers Wayne MDiv  

## 2016-09-30 NOTE — [facility] Suicide Risk Assessment (Signed)
[facility] INPATIENT:  Family/Significant Other Suicide Prevention Education  Suicide Prevention Education:  Patient Refusal for Family/Significant Other Suicide Prevention Education: The patient Stuart Rivers has refused to provide written consent for family/significant other to be provided Family/Significant Other Suicide Prevention Education during admission and/or prior to discharge.  Physician notified.  Verdene LennertLauren C Estel Scholze 09/30/2016, 3:20 PM

## 2016-09-30 NOTE — [facility] Group Notes (Signed)
[facility] LCSW Group Therapy  09/30/2016 1:23 PM  Type of Therapy:  Group Therapy  Participation Level: DID NOT ATTEND. Pt chose to remain in room.  Summary of Progress/Problems: Today's Topic: Overcoming Obstacles. Patients identified one short term goal and potential obstacles in reaching this goal. Patients processed barriers involved in overcoming these obstacles. Patients identified steps necessary for overcoming these obstacles and explored motivation (internal and external) for facing these difficulties head on.   Ambers Iyengar N Smart LCSW 09/30/2016, 1:23 PM
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
## 2016-09-30 NOTE — ED Notes (Signed)
[facility] only medication given by provider.

## 2016-10-01 DIAGNOSIS — F1994 Other psychoactive substance use, unspecified with psychoactive substance-induced mood disorder: Secondary | ICD-10-CM

## 2016-10-01 DIAGNOSIS — Z803 Family history of malignant neoplasm of breast: Secondary | ICD-10-CM

## 2016-10-01 DIAGNOSIS — Z833 Family history of diabetes mellitus: Secondary | ICD-10-CM

## 2016-10-01 DIAGNOSIS — Z8249 Family history of ischemic heart disease and other diseases of the circulatory system: Secondary | ICD-10-CM

## 2016-10-01 DIAGNOSIS — Z79899 Other long term (current) drug therapy: Secondary | ICD-10-CM

## 2016-10-01 DIAGNOSIS — F332 Major depressive disorder, recurrent severe without psychotic features: Secondary | ICD-10-CM

## 2016-10-01 DIAGNOSIS — Z8261 Family history of arthritis: Secondary | ICD-10-CM

## 2016-10-01 DIAGNOSIS — Z9889 Other specified postprocedural states: Secondary | ICD-10-CM

## 2016-10-01 DIAGNOSIS — F17210 Nicotine dependence, cigarettes, uncomplicated: Secondary | ICD-10-CM

## 2016-10-01 DIAGNOSIS — F4310 Post-traumatic stress disorder, unspecified: Secondary | ICD-10-CM

## 2016-10-01 MED ORDER — QUETIAPINE FUMARATE 25 MG PO TABS
25.0000 mg | ORAL_TABLET | Freq: Every day | ORAL | Status: DC
  Administered 2016-10-01: 25 mg via ORAL
  Filled 2016-10-01 (×4): qty 1
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
## 2016-10-01 NOTE — Progress Notes (Signed)
Recreation Therapy Notes  Animal-Assisted Activity (AAA) Program Checklist/Progress Notes Patient Eligibility Criteria Checklist & Daily Group note for Rec TxIntervention  Date: 02.06.2018 Time: 2:45pm [address]    AAA/T Program Assumption of Risk Form signed by Patient/ or Parent Legal Guardian Yes  Patient is free of allergies or sever asthma Yes  Patient reports no fear of animals Yes  Patient reports no history of cruelty to animals Yes  Patient understands his/her participation is voluntary Yes  Patient washes hands before animal contact Yes  Patient washes hands after animal contact Yes  Behavioral Response: Appropriate   Education:Hand Washing, Appropriate Animal Interaction   Education Outcome: Acknowledges education.   Clinical Observations/Feedback: Patient attended session and interacted appropriately with therapy dog and peers.    Marykay Lexenise L Peighton Mehra, LRT/CTRS        Leslieanne Cobarrubias L 10/01/2016 3:05 PM

## 2016-10-01 NOTE — Plan of Care (Signed)
Problem: Coping: Goal: Ability to cope will improve Outcome: Progressing Pt was able to talk with peers and staff to help him when he was feeling overly depressed today

## 2016-10-01 NOTE — [facility] Group Notes (Signed)
[facility] LCSW Group Therapy  10/01/2016 4:05 PM  Type of Therapy:  Group Therapy  Participation Level:  Active  Participation Quality:  Attentive  Affect:  Appropriate  Cognitive:  Alert and Oriented  Insight:  Engaged  Engagement in Therapy:  Engaged  Modes of Intervention:  Confrontation, Discussion, Education, Problem-solving, Socialization and Support  Summary of Progress/Problems: [facility]HA Speaker came to talk about his personal journey with substance abuse and addiction. The pt processed ways by which to relate to the speaker. MHA speaker provided handouts and educational information pertaining to groups and services offered by [facility].    Kay Shippy N Smart LCSW 10/01/2016, 4:05 PM

## 2016-10-01 NOTE — Progress Notes (Signed)
CSW met with pt individually. Pt states that [facility] and was encouraged by CS[facility] to find out bed availability. Pt verbalized understanding and agreeable to follow-up at [facility] from here. He did not want inpatient referral. Pt pleasant and cooperative.  Maxie Better, MSW, LCSW Clinical Social Worker 10/01/2016 4:36 PM

## 2016-10-01 NOTE — Progress Notes (Signed)
Pt attend group. His day was a 4. His goal was not be depress, he did not meet his goal. He talked to the Child psychotherapistsocial worker and the nurse today.

## 2016-10-01 NOTE — Progress Notes (Signed)
D: Pt passive SI-contracts for safety denies HI/AVH. Pt is pleasant and cooperative. Pt concerned that the Risperdal will help him, because he stated the voices come at night.   A: Pt was offered support and encouragement. Pt was given scheduled medications. Pt was encourage to attend groups. Q 15 minute checks were done for safety.   R:Pt attends groups and interacts well with peers and staff. Pt is taking medication. Pt has no complaints .Pt receptive to treatment and safety maintained on unit.

## 2016-10-01 NOTE — Progress Notes (Signed)
D: Pt passive SI-contracts for safety denies HI/AVH. Pt is pleasant and cooperative. Pt stated he only has the voices and sometimes night terrors at night. Pt stated he had increased depression today, but was able to get through it by being in the dayroom and talking with his peers.   A: Pt was offered support and encouragement. Pt was given scheduled medications. Pt was encourage to attend groups. Q 15 minute checks were done for safety.   R:Pt attends groups and interacts well with peers and staff. Pt is taking medication. Pt receptive to treatment and safety maintained on unit.

## 2016-10-01 NOTE — Progress Notes (Signed)
DAR NOTE: Patient presents with flat affect and depressed mood.  Reports auditory hallucination and suicidal thoughts but contracts for safety.  Reports Risperdal not working well for him.  MD made aware of complaint.  Patient to start Seroquel at bedtime.  Maintained on routine safety checks.  Medications given as prescribed.  Support and encouragement offered as needed.  Attended group and participated.  Minimal interaction with staff and peers.  Patient is visible in the milieu.

## 2016-10-02 DIAGNOSIS — F29 Unspecified psychosis not due to a substance or known physiological condition: Secondary | ICD-10-CM | POA: Diagnosis present

## 2016-10-02 DIAGNOSIS — R45851 Suicidal ideations: Secondary | ICD-10-CM

## 2016-10-02 MED ORDER — OLANZAPINE 5 MG PO TABS
5.0000 mg | ORAL_TABLET | Freq: Every day | ORAL | Status: DC
  Administered 2016-10-02: 5 mg via ORAL
  Filled 2016-10-02: qty 1
  Filled 2016-10-02: qty 2
  Filled 2016-10-02: qty 1

## 2016-10-02 MED ORDER — OLANZAPINE 5 MG PO TABS
5.0000 mg | ORAL_TABLET | Freq: Two times a day (BID) | ORAL | Status: DC
  Filled 2016-10-02 (×2): qty 1

## 2016-10-02 NOTE — [facility] Group Notes (Signed)
[facility] LCSW Group Therapy  10/02/2016 3:47 PM  Type of Therapy:  Group Therapy  Participation Level:  Did Not Attend-pt invited. Chose to remain in room.   Summary of Progress/Problems: Emotion Regulation: This group focused on both positive and negative emotion identification and allowed group members to process ways to identify feelings, regulate negative emotions, and find healthy ways to manage internal/external emotions. Group members were asked to reflect on a time when their reaction to an emotion led to a negative outcome and explored how alternative responses using emotion regulation would have benefited them. Group members were also asked to discuss a time when emotion regulation was utilized when a negative emotion was experienced.   Avigdor Dollar N Smart LCSW 10/02/2016, 3:47 PM

## 2016-10-02 NOTE — Progress Notes (Signed)
Patient ID: Stuart Rivers, male   DOB: 1990/10/26, 26 y.o.   MRN: [id number]   Pt currently presents with a masked affect and cooperative behavior. Pt reports to writer that their goal is to "not be as depressed." Pt reports that his depression is a 5/10. Pt reports good sleep with current medication regimen.   Pt provided with medications per providers orders. Pt's labs and vitals were monitored throughout the night. Pt given a 1:1 about emotional and mental status. Pt supported and encouraged to express concerns and questions. Pt educated on medications.  Pt's safety ensured with 15 minute and environmental checks. Pt currently denies SI/HI and visual hallucinations. Pt verbally agrees to seek staff if SI/HI or VH occurs and to consult with staff before acting on any harmful thoughts. Will continue POC.
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
## 2016-10-02 NOTE — Plan of Care (Signed)
Problem: Safety: Goal: Periods of time without injury will increase Outcome: Progressing Patient has not engaged in self harm however does endorse passive SI. Denies intent, plan and verbally contracts for safety.  Problem: Medication: Goal: Compliance with prescribed medication regimen will improve Outcome: Progressing Patient is med compliant.

## 2016-10-02 NOTE — Progress Notes (Signed)
D: Spoke with patient 1:1. Rates sleep as fair, appetite as good, energy as normal and concentration as poor. Patient's affect flat with depressed, anxious mood. Rating depression, hopelessness and anxiety at a 0/10 however endorses command AH to harm self. Denies plan, intent. States goal for today is to "work on my depression by trying." Denies pain, physical problems.   A: Medicated per orders, no prns required or requested. Emotional support offered and self inventory reviewed. Encouraged completion of Suicide Safety Plan. Discussed POC with MD, SW.   R: Patient verbalizes understanding of POC. Patient verbally contracts for safety. No HI and remains safe on level III obs. Will continue to monitor closely.
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
## 2016-10-03 LAB — LIPID PANEL
CHOL/HDL RATIO: 3.1 ratio
Cholesterol: 185 mg/dL (ref 0–200)
HDL: 59 mg/dL (ref >40)
LDL Cholesterol: 102 mg/dL — ABNORMAL HIGH (ref 0–99)
Triglycerides: 121 mg/dL (ref <150)
VLDL: 24 mg/dL (ref 0–40)

## 2016-10-03 MED ORDER — OLANZAPINE 7.5 MG PO TABS
7.5000 mg | ORAL_TABLET | Freq: Every day | ORAL | Status: DC
  Administered 2016-10-03: 7.5 mg via ORAL
  Filled 2016-10-03 (×4): qty 1
  Filled 2016-10-03: qty 3

## 2016-10-03 NOTE — Progress Notes (Signed)
D: Takumi admitted some SI with no plan but contracted for safety. He denied HI and AVH. He was pleasant and cooperative with this Clinical research associatewriter. He remained in his room for much of the day before he was transferred to 503-2. He was displeased with news of transfer but remained polite/cordial. On his self inventory this a.m., he rated his depression 7/10, hopelessness 5/10, and anxiety 6/10. He also reported thoughts of suicide on his self inventory. His goal was to work on managing his emotions.   A: Meds given as ordered. Q15 safety checks maintained. Support/encouragement offered.  R: Pt remains free from harm and continues with treatment. Will continue to monitor for needs/safety.

## 2016-10-03 NOTE — Plan of Care (Signed)
Problem: Safety: Goal: Periods of time without injury will increase Outcome: Progressing Pt safe on the unit at this time   

## 2016-10-03 NOTE — [facility] Group Notes (Signed)
[facility] LCSW Group Therapy  10/03/2016 2:57 PM  Type of Therapy:  Group Therapy  Participation Level:  Active  Participation Quality:  Attentive  Affect:  Appropriate  Cognitive:  Alert and Oriented  Insight:  Improving  Engagement in Therapy:  Improving  Modes of Intervention:  Confrontation, Discussion, Education, Problem-solving, Socialization and Support  Summary of Progress/Problems: Today's Topic: Overcoming Obstacles. Patients identified one short term goal and potential obstacles in reaching this goal. Patients processed barriers involved in overcoming these obstacles. Patients identified steps necessary for overcoming these obstacles and explored motivation (internal and external) for facing these difficulties head on. Stuart Rivers was attentive and engaged during today's processing group. He shared that he is hoping to get into a local [facility] and plans to follow-up at [facility]. He reports that he has been living "here and there" and has not had any stability lately. Stuart Rivers acknowledges his substance abuse and mental health issues and is hoping to find adequate support at discharge for housing and mental health services. He is actively working with CSW to establish appropriate follow-up. Stuart Rivers continues to demonstrate improving insight and progress in the group setting. He is able to engage with others including CSW during group and remains pleasant and cooperative in this setting.   Stuart Colquitt N Smart LCSW 10/03/2016, 2:57 PM

## 2016-10-03 NOTE — Progress Notes (Signed)
D: Pt passive SI/ AH- at night. Pt is pleasant and cooperative. Pt stated the Zyprexa is helping and possibly needs to be bumped up a little, but seems to be helping. Pt seen on the unit interacting with peers and playing games. Pt did visit with mother and brother this evening that appeared to go well.   A: Pt was offered support and encouragement. Pt was given scheduled medications. Pt was encourage to attend groups. Q 15 minute checks were done for safety.   R:Pt attends groups and interacts well with peers and staff. Pt is taking medication. Pt has no complaints.Pt receptive to treatment and safety maintained on unit.

## 2016-10-03 NOTE — Progress Notes (Signed)
D: patient transferred to 503-2. Patient A&Ox4, denies SI/HI and AVH. Patient presents with a blunted affect but pleasant and cooperative. Patient lying in bed in his room. Patient questioning why he was transferrred from 300 hall.  A: Patient offered medications, food and drinks. Patient informed that certain halls are more appropriate for certain diagnosis.Emotional support offered to patient. Continue monitoring patient q15 minutes for safety. R: patient remains safe. Patient verbalized understanding to let staff know if anything changes. Patient is cooperative and pleasant and states he is ok with the move.  Salvatore Shear, Wyman SongsterAngela Marie, RN
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
## 2016-10-04 DIAGNOSIS — F333 Major depressive disorder, recurrent, severe with psychotic symptoms: Principal | ICD-10-CM

## 2016-10-04 DIAGNOSIS — F1220 Cannabis dependence, uncomplicated: Secondary | ICD-10-CM

## 2016-10-04 DIAGNOSIS — F1420 Cocaine dependence, uncomplicated: Secondary | ICD-10-CM | POA: Diagnosis present

## 2016-10-04 DIAGNOSIS — F1099 Alcohol use, unspecified with unspecified alcohol-induced disorder: Secondary | ICD-10-CM

## 2016-10-04 LAB — HEMOGLOBIN A1C
Hgb A1c MFr Bld: 5.5 % (ref 4.8–5.6)
MEAN PLASMA GLUCOSE: 111 mg/dL

## 2016-10-04 MED ORDER — OLANZAPINE 10 MG PO TABS
10.0000 mg | ORAL_TABLET | Freq: Every day | ORAL | Status: DC
  Administered 2016-10-04 – 2016-10-06 (×3): 10 mg via ORAL
  Filled 2016-10-04 (×5): qty 1

## 2016-10-04 MED ORDER — TRAZODONE HCL 100 MG PO TABS
100.0000 mg | ORAL_TABLET | Freq: Every day | ORAL | Status: DC
  Administered 2016-10-04 – 2016-10-06 (×3): 100 mg via ORAL
  Filled 2016-10-04 (×6): qty 1

## 2016-10-04 NOTE — Tx Team (Signed)
Interdisciplinary Treatment and Diagnostic Plan Update  10/04/2016 Time of Session: 9:30AM Stuart Rivers MRN: [id number]  Principal Diagnosis: Psychosis  Secondary Diagnoses: Principal Problem:   Psychosis Active Problems:   Recurrent major depression-severe (HCC)   Substance induced mood disorder (HCC)   PTSD (post-traumatic stress disorder)   Current Medications:  Current Facility-Administered Medications  Medication Dose Route Frequency Provider Last Rate Last Dose  . acetaminophen (TYLENOL) tablet 650 mg  650 mg Oral Q6H PRN Jackelyn Poling, NP   650 mg at 10/03/16 2100  . alum & mag hydroxide-simeth (MAALOX/MYLANTA) 200-200-20 MG/5ML suspension 30 mL  30 mL Oral Q4H PRN Jackelyn Poling, NP      . hydrOXYzine (ATARAX/VISTARIL) tablet 25 mg  25 mg Oral TID PRN Jackelyn Poling, NP   25 mg at 10/03/16 2102  . magnesium hydroxide (MILK OF MAGNESIA) suspension 30 mL  30 mL Oral Daily PRN Jackelyn Poling, NP      . OLANZapine (ZYPREXA) tablet 7.5 mg  7.5 mg Oral QHS Burnard Leigh, MD   7.5 mg at 10/03/16 2100  . sertraline (ZOLOFT) tablet 50 mg  50 mg Oral Daily Georgiann Cocker, MD   50 mg at 10/03/16 0803  . traZODone (DESYREL) tablet 50 mg  50 mg Oral QHS PRN Adonis Brook, NP   50 mg at 10/03/16 2100   PTA Medications: Prescriptions Prior to Admission  Medication Sig Dispense Refill Last Dose  . sertraline (ZOLOFT) 50 MG tablet Take 1.5 tablets (75 mg total) by mouth daily. (Patient not taking: Reported on 03/25/2016) 45 tablet 3 Not Taking at Unknown time    Patient Stressors: Financial difficulties Substance abuse  Patient Strengths: Ability for insight Motivation for treatment/growth Supportive family/friends  Treatment Modalities: Medication Management, Group therapy, Case management,  1 to 1 session with clinician, Psychoeducation, Recreational therapy.   Physician Treatment Plan for Primary Diagnosis: Psychosis Long Term Goal(s): Improvement in symptoms so as  ready for discharge Improvement in symptoms so as ready for discharge   Short Term Goals: Ability to identify changes in lifestyle to reduce recurrence of condition will improve Ability to verbalize feelings will improve Ability to demonstrate self-control will improve Ability to identify and develop effective coping behaviors will improve Ability to maintain clinical measurements within normal limits will improve Compliance with prescribed medications will improve Ability to identify triggers associated with substance abuse/mental health issues will improve Ability to identify changes in lifestyle to reduce recurrence of condition will improve Ability to verbalize feelings will improve Ability to disclose and discuss suicidal ideas Ability to demonstrate self-control will improve Ability to identify and develop effective coping behaviors will improve Ability to maintain clinical measurements within normal limits will improve Compliance with prescribed medications will improve Ability to identify triggers associated with substance abuse/mental health issues will improve  Medication Management: Evaluate patient's response, side effects, and tolerance of medication regimen.  Therapeutic Interventions: 1 to 1 sessions, Unit Group sessions and Medication administration.  Evaluation of Outcomes: Progressing  Physician Treatment Plan for Secondary Diagnosis: Principal Problem:   Psychosis Active Problems:   Recurrent major depression-severe (HCC)   Substance induced mood disorder (HCC)   PTSD (post-traumatic stress disorder)  Long Term Goal(s): Improvement in symptoms so as ready for discharge Improvement in symptoms so as ready for discharge   Short Term Goals: Ability to identify changes in lifestyle to reduce recurrence of condition will improve Ability to verbalize feelings will improve Ability to demonstrate self-control will improve  Ability to identify and develop effective  coping behaviors will improve Ability to maintain clinical measurements within normal limits will improve Compliance with prescribed medications will improve Ability to identify triggers associated with substance abuse/mental health issues will improve Ability to identify changes in lifestyle to reduce recurrence of condition will improve Ability to verbalize feelings will improve Ability to disclose and discuss suicidal ideas Ability to demonstrate self-control will improve Ability to identify and develop effective coping behaviors will improve Ability to maintain clinical measurements within normal limits will improve Compliance with prescribed medications will improve Ability to identify triggers associated with substance abuse/mental health issues will improve     Medication Management: Evaluate patient's response, side effects, and tolerance of medication regimen.  Therapeutic Interventions: 1 to 1 sessions, Unit Group sessions and Medication administration.  Evaluation of Outcomes: Progressing   RN Treatment Plan for Primary Diagnosis: Psychosis Long Term Goal(s): Knowledge of disease and therapeutic regimen to maintain health will improve  Short Term Goals: Ability to remain free from injury will improve, Ability to disclose and discuss suicidal ideas and Ability to identify and develop effective coping behaviors will improve  Medication Management: RN will administer medications as ordered by provider, will assess and evaluate patient's response and provide education to patient for prescribed medication. RN will report any adverse and/or side effects to prescribing provider.  Therapeutic Interventions: 1 on 1 counseling sessions, Psychoeducation, Medication administration, Evaluate responses to treatment, Monitor vital signs and CBGs as ordered, Perform/monitor CIWA, COWS, AIMS and Fall Risk screenings as ordered, Perform wound care treatments as ordered.  Evaluation of Outcomes:  Progressing   LCSW Treatment Plan for Primary Diagnosis: Psychosis Long Term Goal(s): Safe transition to appropriate next level of care at discharge, Engage patient in therapeutic group addressing interpersonal concerns.  Short Term Goals: Engage patient in aftercare planning with referrals and resources, Facilitate patient progression through stages of change regarding substance use diagnoses and concerns and Identify triggers associated with mental health/substance abuse issues  Therapeutic Interventions: Assess for all discharge needs, 1 to 1 time with Social worker, Explore available resources and support systems, Assess for adequacy in community support network, Educate family and significant other(s) on suicide prevention, Complete Psychosocial Assessment, Interpersonal group therapy.  Evaluation of Outcomes: Progressing   Progress in Treatment: Attending groups: Yes Participating in groups: Yes--pt tends to isolate in room but when encouraged, will attend group with minimal participation.  Taking medication as prescribed: Yes. Toleration medication: Yes. Family/Significant other contact made: Collateral contact obtained by Dr. Jeronimo GreavesEktar by pt's father; pt declined to consent to SPE contact with family. SPE completed with patient.  Patient understands diagnosis: Somewhat--pt understands that AVH is not "normal" and is open to medication management and counseling to help him with these symptoms.  Discussing patient identified problems/goals with staff: Yes. Medical problems stabilized or resolved: Yes. Denies suicidal/homicidal ideation: Yes, self report.  Issues/concerns per patient self-inventory: No. Other: n/a   New problem(s) identified: No, Describe:  n/a  New Short Term/Long Term Goal(s): medication stabilization, detox; development of comprehensive mental wellness/sobriety plan.   Discharge Plan or Barriers: [facility]/possibly [facility] for medication management due to symptoms of  psychosis. Pt plans to either return with family or [facility] and was planning to [facility] for interviews yesterday evening. CSW continuing to assess.   Reason for Continuation of Hospitalization: Depression Medication stabilization AVH  Estimated Length of Stay: 2-4 days   Attendees: Patient: 10/04/2016 8:27 AM  Physician: Janett Billowr.Eappen MD 10/04/2016  8:27 AM  Nursing: Yvonne Kendall RN; Roni RN 10/04/2016 8:27 AM  RN Care Manager: Onnie Boer CM 10/04/2016 8:27 AM  Social Worker: Trula Slade, LCSW;  Baldo Daub CSW Intern  10/04/2016 8:27 AM  Recreational Therapist: Juliann Pares 10/04/2016 8:27 AM  Other: Gray Bernhardt NP; Armandina Stammer NP 10/04/2016 8:27 AM  Other:  10/04/2016 8:27 AM  Other: 10/04/2016 8:27 AM    Scribe for Treatment Team: Ledell Peoples Smart, LCSW 10/04/2016 8:27 AM
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
## 2016-10-04 NOTE — Progress Notes (Signed)
Recreation Therapy Notes  Date: 10/04/16 Time: 1000 Location: 500 Hall Dayroom  Group Topic: Communication, Team Building, Problem Solving  Goal Area(s) Addresses:  Patient will effectively work with peer towards shared goal.  Patient will identify skill used to make activity successful.  Patient will identify how skills used during activity can be used to reach post d/c goals.   Behavioral Response: Engaged  Intervention: STEM Activity   Activity: Glass blower/designeripe Cleaner Tower. In teams, patients were asked to build the tallest freestanding tower possible out of 15 pipe cleaners. Systematically resources were removed, for example patient ability to use both hands and patient ability to verbally communicate.    Education:Social Skills, Discharge Planning.   Education Outcome: Acknowledges education/In group clarification offered/Needs additional education.   Clinical Observations/Feedback: Pt stated he and his group used creative ideas and a triangular base.  Pt also stated drugs prevents him from using the skills from the group at home because it leaves him scatter brained and unable to focus.  Pt expressed using these skills with his support system will help with communication and allow him to think outside the box to come up with solutions.  Pt stated using these skills post discharge will help him "keep things in order."   Deette Revak Lillia AbedLindsay, LRT/CTRS     Caroll RancherLindsay, Rogina Schiano A 10/04/2016 12:32 PM

## 2016-10-04 NOTE — Progress Notes (Signed)
Adult Psychoeducational Group Note  Date:  10/04/2016 Time:  8:53 PM  Group Topic/Focus:  Wrap-Up Group:   The focus of this group is to help patients review their daily goal of treatment and discuss progress on daily workbooks.  Participation Level:  Active  Participation Quality:  Appropriate  Affect:  Appropriate  Cognitive:  Appropriate  Insight: Appropriate  Engagement in Group:  Engaged  Modes of Intervention:  Discussion  Additional Comments: The patient expressed that he attended groups.The patient also said that he rates a 7.  Octavio Mannshigpen, Lazarus Sudbury Lee 10/04/2016, 8:53 PM

## 2016-10-04 NOTE — Progress Notes (Signed)
Patient has been up and active on the unit, Writer observed him in the dayroom laughing and talking with peers during an [employer] game. He attended group this evening and writer informed him of his hs medications.  Patient currently denies si/hi/a/v hall. He reports that he plans to return to his grandmothers after discharge. Support and encouragement offered, safety maintained on unit, will continue to monitor.

## 2016-10-05 LAB — TSH: TSH: 1.566 u[IU]/mL (ref 0.350–4.500)

## 2016-10-05 NOTE — Progress Notes (Signed)
D: Pt denies SI/HI. Pt. States he has auditory hallucinations where he hears someone calling his name when he is sleeping. Pt is pleasant and cooperative. Pt goal for today is to work on "being happy". A: Pt was offered support and encouragement. Pt was given scheduled medications. Pt was encourage to attend groups. Q 15 minute checks were done for safety.  R:Pt attends groups and interacts well with peers and staff. Pt is taking medication. Pt has no complaints.Pt receptive to treatment and safety maintained on unit.

## 2016-10-05 NOTE — Progress Notes (Signed)
Patient ID: Stuart Rivers, male   DOB: 12/25/1990, 26 y.o.   MRN: [id number]    EKG completed and placed on the chart. Dr Lolly MustacheArfeen made aware. Jacquelyne BalintShalita Leonte Horrigan RN

## 2016-10-05 NOTE — [facility] Group Notes (Signed)
Adult Psychoeducational Group Note  Date:  10/05/2016 Time:  8:15 PM  Group Topic/Focus:  Wrap-Up Group:   The focus of this group is to help patients review their daily goal of treatment and discuss progress on daily workbooks.  Participation Level:  Active  Participation Quality:  Attentive  Affect:  Appropriate  Cognitive:  Appropriate  Insight: Appropriate  Engagement in Group:  Engaged  Modes of Intervention:  Discussion and Education  Additional Comments:  Patient reported working on "being happy."  Patient confirmed feeling that his social interaction with others contribute to him feeling happy.  Stuart Rivers, Stuart Rivers 10/05/2016, 8:53 PM

## 2016-10-05 NOTE — Progress Notes (Signed)
D: Patient's self inventory sheet: patient has good sleep, did not request sleep medication.fair  Appetite, normal energy level, poor concentration. Rated depression 6/10, hopeless 4/10, anxiety 8/10. SI/HI/AVH: continues to endorse SI and vosices. Physical complaints are pain. Goal is "being happy". Plans to work on being in group".   A: Medications administered, assessed medication knowledge and education given on medication regimen.  Emotional support and encouragement given patient. R: Reports  SI and denies HI , contracts for safety. Safety maintained with 15 minute checks.

## 2016-10-05 NOTE — [facility] Group Notes (Signed)
[facility] Group Notes:  (Nursing/MHT/Case Management/Adjunct)  Date:  10/05/2016  Time:  11:58 AM  Type of Therapy:  Psychoeducational Skills  Participation Level:  Did Not Attend  Participation Quality:  Did Not Attend  Affect:  Did Not Attend  Cognitive:  Did Not Attend  Insight:  None  Engagement in Group:  Did Not Attend  Modes of Intervention:  Did Not Attend  Summary of Progress/Problems: Pt did not attend patient self inventory group.   Stuart Rivers 10/05/2016, 11:58 AM 

## 2016-10-05 NOTE — [facility] Group Notes (Signed)
Adult Therapy Group Note (Clinical Social Work)  Date:  10/05/2016 Time: 11:15AM-12:00PM  Group Topic/Focus: Healthy Coping Techniques  The focus of this group was to help patients identify healthy coping techniques they use in their lives at home, as well as healthy coping skills they can use during this admission.  Each person explained their techniques to the rest of the group and told how it helps them.  At the end of the group, a 5-minute guided meditation was done, and patient reactions were overwhelming positive, as they stated they felt "relaxed," "like a natural high," "wonderful."  They expressed an interest in using meditation at discharge.  Participation Level:  Active  Participation Quality:  Appropriate, Attentive and Sharing  Affect:  Appropriate  Cognitive:  Disorganized  Insight: Appropriate  Engagement in Group:  Engaged  Modes of Intervention:  Activity, Discussion and Exploration  Additional Comments:  Pt stated he uses exercise such as push ups and walks a lot as a means of coping.  He talked about wanting a job at d/c, and how he can go about obtaining that.    Carloyn JaegerMareida J Grossman-Orr 10/05/2016, 12:24 PM
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
## 2016-10-06 LAB — PROLACTIN: PROLACTIN: 47.5 ng/mL — AB (ref 4.0–15.2)

## 2016-10-06 NOTE — [facility] Group Notes (Signed)
[facility] Group Notes:  (Clinical Social Work)  10/06/2016  11:00AM-12:00PM  Summary of Progress/Problems:  The main focus of today's process group was to listen to a variety of genres of music and to identify that different types of music provoke different responses.  The patient then was able to identify personally what was soothing for them, as well as energizing, as well as how patient can personally use this knowledge in sleep habits, with depression, and with other symptoms.  The patient expressed at the beginning of group the overall feeling of "blank."  During group, he smiled, moved to the music, closed his eyes and took deep breaths.  At the end of group he was considerably more relaxed and smiling more.  Type of Therapy:  Music Therapy   Participation Level:  Active  Participation Quality:  Attentive and Sharing  Affect:  Appropriate  Cognitive:  Oriented  Insight:  Engaged  Engagement in Therapy:  Engaged  Modes of Intervention:   Activity, Exploration  Ambrose MantleMareida Grossman-Orr, LCSW 10/06/2016

## 2016-10-06 NOTE — Progress Notes (Signed)
D: Pt passive SI contracts for safety denies HI/AVH. Pt is pleasant and cooperative. Pt observed interacting with peers in the dayroom in no distress. Pt only endorses AH at night. Pt stated he felt like the Seroquel worked better than the Zyprexa.   A: Pt was offered support and encouragement. Pt was given scheduled medications. Pt was encourage to attend groups. Q 15 minute checks were done for safety.   R:Pt attends groups and interacts well with peers and staff. Pt is taking medication. Pt receptive to treatment and safety maintained on unit.
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
## 2016-10-06 NOTE — Progress Notes (Signed)
DAR NOTE: Patient presents with anxious affect and depressed mood.  Pt spent most of the am in the bed, has been visible in the milieu interacting with peers.   Denies pain, auditory and visual hallucinations, but endorse passive SI with no plans. Pt verbally contracted for safety. Rates depression at 7, hopelessness at 7, and anxiety at 7.  Maintained on routine safety checks.  Medications given as prescribed.  Support and encouragement offered as needed.   States goal for today is " being happy." Patient observed socializing with peers in the dayroom.  Offered no complaint.

## 2016-10-06 NOTE — Plan of Care (Signed)
Problem: Safety: Goal: Periods of time without injury will increase Outcome: Progressing Pt safe on the unit at this time   

## 2016-10-07 MED ORDER — TRAZODONE HCL 100 MG PO TABS: 100.0000 mg | ORAL_TABLET | Freq: Every evening | ORAL | Status: DC | PRN

## 2016-10-07 MED ORDER — DOXEPIN HCL 10 MG PO CAPS
10.0000 mg | ORAL_CAPSULE | Freq: Every day | ORAL | Status: DC
  Administered 2016-10-07 – 2016-10-08 (×2): 10 mg via ORAL
  Filled 2016-10-07: qty 7
  Filled 2016-10-07 (×3): qty 1

## 2016-10-07 MED ORDER — OLANZAPINE 5 MG PO TBDP: 5.0000 mg | ORAL_TABLET | Freq: Two times a day (BID) | ORAL | Status: DC | PRN

## 2016-10-07 MED ORDER — OLANZAPINE 7.5 MG PO TABS
15.0000 mg | ORAL_TABLET | Freq: Every day | ORAL | Status: DC
  Administered 2016-10-07 – 2016-10-08 (×2): 15 mg via ORAL
  Filled 2016-10-07 (×3): qty 2
  Filled 2016-10-07: qty 14

## 2016-10-07 MED ORDER — OLANZAPINE 10 MG IM SOLR: 5.0000 mg | Freq: Two times a day (BID) | INTRAMUSCULAR | Status: DC | PRN

## 2016-10-07 NOTE — [facility] Group Notes (Signed)
[facility] LCSW Group Therapy  10/07/2016 3:23 PM  Type of Therapy:  Group Therapy  Participation Level:  Active  Participation Quality:  Attentive  Affect:  Flat  Cognitive:  Alert  Insight:  Improving  Engagement in Therapy:  Improving  Modes of Intervention:  Activity, Discussion, Education, Socialization and Support  Summary of Progress/Problems: Patients identify obstacles, self-sabotaging and enabling behaviors. Patients explore aspects of self sabotage and enabling and how to limit these self-destructive behaviors in everyday life. Pt attended group and stayed the entire time. He identified his obstacle as cravings for drugs. He states he needs to be more spiritual.   Stuart Rivers MSW, LCSWA  10/07/2016, 3:23 PM
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
## 2016-10-07 NOTE — Progress Notes (Signed)
DAR NOTE: Patient presents with anxious affect and mood.  Patient appears restless, loud and paranoid.  Denies pain, auditory and visual hallucinations but observed responding to internal stimuli.  Rates depression at 5, hopelessness at 8, and anxiety at 8.  Maintained on routine safety checks.  Medications given as prescribed.  Support and encouragement offered as needed.  Attended group and participated.  Patient observed socializing with peers in the dayroom.  Offered no complaint.

## 2016-10-07 NOTE — Progress Notes (Signed)
Recreation Therapy Notes  Date: 10/07/16 Time: 1000 Location: [address] Dayroom  Group Topic: Self-Esteem  Goal Area(s) Addresses:  Patient will identify positive ways to increase self-esteem. Patient will verbalize benefit of increased self-esteem.  Behavioral Response: Engaged  Intervention: Empty picture frame worksheet, markers, colored pencils  Activity: Mirror, Mirror.  Patients were given a worksheet with an empty picture frame on it.  Patients were to draw a picture and/or us words to describe what they think of themselves.  Education:  Self-Esteem, Building control surveyorDischarge Planning.   Education Outcome: Acknowledges education/In group clarification offered/Needs additional education  Clinical Observations/Feedback: Pt arrived late.  Pt described himself as having a sense of humor, handsome, smart, a people person and spiritual person.  When asked if there was an instance where he didn't feel confident in himself.  Pt stated he didn't feel confident when he first came here because he didn't know what was going on with him.  Pt went on to say that he now knows what is going on with him.  Pt expressed he can now deal with the issue that got him here by "taking my medication and analyze the situation before reacting and not be impulsive."   Caroll RancherMarjette Karell Tukes, LRT/CTRS       Caroll RancherLindsay, Will Schier A 10/07/2016 11:28 AM

## 2016-10-08 NOTE — Progress Notes (Signed)
Adult Psychoeducational Group Note  Date:  10/08/2016 Time:  8:38 PM  Group Topic/Focus:  Wrap-Up Group:   The focus of this group is to help patients review their daily goal of treatment and discuss progress on daily workbooks.  Participation Level:  Active  Participation Quality:  Appropriate  Affect:  Appropriate  Cognitive:  Appropriate  Insight: Appropriate  Engagement in Group:  Engaged  Modes of Intervention:  Discussion  Additional Comments:  Pt stated his goal was to be happy, he stated he "kind of accomplished this." Pt was encouraged to make his needs known to staff.  Caswell CorwinOwen, Jerlean Peralta C 10/08/2016, 8:38 PM

## 2016-10-08 NOTE — Progress Notes (Signed)
Stuart Rivers. Renald had been up and visible in milieu this evening, seen interacting appropriately with peers in dayroom. Ivar DrapeWillie spoke about how he has not been sleeping well but reports doing ok otherwise. Ivar DrapeWillie did receive bedtime medications without incident and did not verbalize any complaints of pain. A. Support and encouragement provided. R. Safety maintained, will continue to monitor.
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
## 2016-10-08 NOTE — Progress Notes (Signed)
Patient ID: Stuart Rivers, male   DOB: August 26, 1991, 26 y.o.   MRN: [id number] D: Client visible on the unit, seen playing cards and watching TV. Client interacts appropriately with peers and staff. Client reports depression and anxiety "2' of 10. Client reports the admission has been helpful "the medicine" "the group need more speakers" A: Writer provided emotional support, encouraged client to follow through with discharge plan. Medications reviewed, administered as ordered. Staff will monitor q4315min for safety. R: Client is safe on the unit, attended group.
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
## 2016-10-08 NOTE — Progress Notes (Signed)
Recreation Therapy Notes  Animal-Assisted Activity (AAA) Program Checklist/Progress Notes Patient Eligibility Criteria Checklist & Daily Group note for Rec Tx Intervention  Date: 02.13.2018 Time: 2:45pm [address]: [address]    AAA/T Program Assumption of Risk Form signed by Patient/ or Parent Legal Guardian Yes  Patient is free of allergies or sever asthma Yes  Patient reports no fear of animals Yes  Patient reports no history of cruelty to animals Yes  Patient understands his/her participation is voluntary Yes  Behavioral Response: Did not attend.    Clinical Observations/Feedback: Patient discussed with MD for appropriateness in pet therapy session. Both LRT and MD agree patient is appropriate for participation. Patient previously housed on [address] and signed consent form at admission. Patient politely declined participation in AAA session today, LRT encouraged patient attendance, however patient maintained he did not want to attend.   Marykay Lexenise L Jillene Wehrenberg, LRT/CTRS       Margy Sumler L 10/08/2016 3:13 PM

## 2016-10-08 NOTE — Progress Notes (Signed)
DAR NOTE: Patient presents with calm affect and pleasant mood.  Denies pain, auditory and visual hallucinations.  described energy level as normal and concentration as good.  Rates depression at 0, hopelessness at 0, and anxiety at 0.  Maintained on routine safety checks.  Medications given as prescribed.  Support and encouragement offered as needed.  Attended group and participated.  States goal for today is "being happy."  Patient observed socializing with peers in the dayroom.  Offered no complaint.

## 2016-10-08 NOTE — Progress Notes (Signed)
Recreation Therapy Notes  Date: 10/08/16 Time: 1000 Location: 500 Hall Dayroom  Group Topic: Wellness  Goal Area(s) Addresses:  Patient will define components of whole wellness. Patient will verbalize benefit of whole wellness.  Behavioral Response: Engaged  Intervention:  ArchivistChairs, beach ball   Activity: Keep it ContractorGoing Volleyball.  Patients were arranged in a circle.  Patients were to hit the ball back and forth to each other without the ball coming to a complete stop.  LRT would keep count of how many hits the patients got before the ball stops.  Education: Wellness, Building control surveyorDischarge Planning.   Education Outcome: Acknowledges education/In group clarification offered/Needs additional education.   Clinical Observations/Feedback:  Pt was fully engaged and active during group.  Pt was social and interactive with peers.  Pt was doing impersonations during the activity and making his peers laugh.  Pt stated that he really liked the activity and it was fun.     Vermon Grays Lindasy, LRT/CTRS         Caroll RancherLindsay, Jaquesha Boroff A 10/08/2016 11:39 AM

## 2016-10-09 MED ORDER — HYDROXYZINE HCL 25 MG PO TABS: 25.0000 mg | ORAL_TABLET | Freq: Three times a day (TID) | ORAL | 0 refills | Status: DC | PRN

## 2016-10-09 MED ORDER — DOXEPIN HCL 10 MG PO CAPS: 10.0000 mg | ORAL_CAPSULE | Freq: Every day | ORAL | 0 refills | Status: DC

## 2016-10-09 MED ORDER — SERTRALINE HCL 50 MG PO TABS: 50.0000 mg | ORAL_TABLET | Freq: Every day | ORAL | 0 refills | Status: DC

## 2016-10-09 MED ORDER — OLANZAPINE 15 MG PO TABS: 15.0000 mg | ORAL_TABLET | Freq: Every day | ORAL | 0 refills | Status: DC

## 2016-10-09 NOTE — Tx Team (Signed)
Interdisciplinary Treatment and Diagnostic Plan Update  10/09/2016 Time of Session: 9:30AM HUSTON STONEHOCKER MRN: [id number]  Principal Diagnosis: MDD (major depressive disorder), recurrent, severe, with psychosis (HCC)  Secondary Diagnoses: Principal Problem:   MDD (major depressive disorder), recurrent, severe, with psychosis (HCC) Active Problems:   PTSD (post-traumatic stress disorder)   Cocaine use disorder, severe, dependence (HCC)   Cannabis use disorder, severe, dependence (HCC)   Current Medications:  Current Facility-Administered Medications  Medication Dose Route Frequency Provider Last Rate Last Dose  . acetaminophen (TYLENOL) tablet 650 mg  650 mg Oral Q6H PRN Jackelyn Poling, NP   650 mg at 10/08/16 2101  . alum & mag hydroxide-simeth (MAALOX/MYLANTA) 200-200-20 MG/5ML suspension 30 mL  30 mL Oral Q4H PRN Jackelyn Poling, NP      . doxepin (SINEQUAN) capsule 10 mg  10 mg Oral QHS Jomarie Longs, MD   10 mg at 10/08/16 2101  . hydrOXYzine (ATARAX/VISTARIL) tablet 25 mg  25 mg Oral TID PRN Jackelyn Poling, NP   25 mg at 10/08/16 2103  . magnesium hydroxide (MILK OF MAGNESIA) suspension 30 mL  30 mL Oral Daily PRN Jackelyn Poling, NP      . OLANZapine zydis (ZYPREXA) disintegrating tablet 5 mg  5 mg Oral BID PRN Jomarie Longs, MD       Or  . OLANZapine (ZYPREXA) injection 5 mg  5 mg Intramuscular BID PRN Jomarie Longs, MD      . OLANZapine (ZYPREXA) tablet 15 mg  15 mg Oral QHS Jomarie Longs, MD   15 mg at 10/08/16 2101  . sertraline (ZOLOFT) tablet 50 mg  50 mg Oral Daily Georgiann Cocker, MD   50 mg at 10/09/16 0747   PTA Medications: Prescriptions Prior to Admission  Medication Sig Dispense Refill Last Dose  . sertraline (ZOLOFT) 50 MG tablet Take 1.5 tablets (75 mg total) by mouth daily. (Patient not taking: Reported on 03/25/2016) 45 tablet 3 Not Taking at Unknown time    Patient Stressors: Financial difficulties Substance abuse  Patient Strengths: Ability for  insight Motivation for treatment/growth Supportive family/friends  Treatment Modalities: Medication Management, Group therapy, Case management,  1 to 1 session with clinician, Psychoeducation, Recreational therapy.   Physician Treatment Plan for Primary Diagnosis: MDD (major depressive disorder), recurrent, severe, with psychosis (HCC) Long Term Goal(s): Improvement in symptoms so as ready for discharge Improvement in symptoms so as ready for discharge   Short Term Goals: Ability to identify changes in lifestyle to reduce recurrence of condition will improve Ability to verbalize feelings will improve Ability to demonstrate self-control will improve Ability to identify and develop effective coping behaviors will improve Ability to maintain clinical measurements within normal limits will improve Compliance with prescribed medications will improve Ability to identify triggers associated with substance abuse/mental health issues will improve Ability to identify changes in lifestyle to reduce recurrence of condition will improve Ability to verbalize feelings will improve Ability to disclose and discuss suicidal ideas Ability to demonstrate self-control will improve Ability to identify and develop effective coping behaviors will improve Ability to maintain clinical measurements within normal limits will improve Compliance with prescribed medications will improve Ability to identify triggers associated with substance abuse/mental health issues will improve  Medication Management: Evaluate patient's response, side effects, and tolerance of medication regimen.  Therapeutic Interventions: 1 to 1 sessions, Unit Group sessions and Medication administration.  Evaluation of Outcomes: Progressing  Physician Treatment Plan for Secondary Diagnosis: Principal Problem:   MDD (major  depressive disorder), recurrent, severe, with psychosis (HCC) Active Problems:   PTSD (post-traumatic stress  disorder)   Cocaine use disorder, severe, dependence (HCC)   Cannabis use disorder, severe, dependence (HCC)  Long Term Goal(s): Improvement in symptoms so as ready for discharge Improvement in symptoms so as ready for discharge   Short Term Goals: Ability to identify changes in lifestyle to reduce recurrence of condition will improve Ability to verbalize feelings will improve Ability to demonstrate self-control will improve Ability to identify and develop effective coping behaviors will improve Ability to maintain clinical measurements within normal limits will improve Compliance with prescribed medications will improve Ability to identify triggers associated with substance abuse/mental health issues will improve Ability to identify changes in lifestyle to reduce recurrence of condition will improve Ability to verbalize feelings will improve Ability to disclose and discuss suicidal ideas Ability to demonstrate self-control will improve Ability to identify and develop effective coping behaviors will improve Ability to maintain clinical measurements within normal limits will improve Compliance with prescribed medications will improve Ability to identify triggers associated with substance abuse/mental health issues will improve     Medication Management: Evaluate patient's response, side effects, and tolerance of medication regimen.  Therapeutic Interventions: 1 to 1 sessions, Unit Group sessions and Medication administration.  Evaluation of Outcomes: Adequate for Discharge   RN Treatment Plan for Primary Diagnosis: MDD (major depressive disorder), recurrent, severe, with psychosis (HCC) Long Term Goal(s): Knowledge of disease and therapeutic regimen to maintain health will improve  Short Term Goals: Ability to remain free from injury will improve, Ability to disclose and discuss suicidal ideas and Ability to identify and develop effective coping behaviors will improve  Medication  Management: RN will administer medications as ordered by provider, will assess and evaluate patient's response and provide education to patient for prescribed medication. RN will report any adverse and/or side effects to prescribing provider.  Therapeutic Interventions: 1 on 1 counseling sessions, Psychoeducation, Medication administration, Evaluate responses to treatment, Monitor vital signs and CBGs as ordered, Perform/monitor CIWA, COWS, AIMS and Fall Risk screenings as ordered, Perform wound care treatments as ordered.  Evaluation of Outcomes: Adequate for Discharge   LCSW Treatment Plan for Primary Diagnosis: MDD (major depressive disorder), recurrent, severe, with psychosis (HCC) Long Term Goal(s): Safe transition to appropriate next level of care at discharge, Engage patient in therapeutic group addressing interpersonal concerns.  Short Term Goals: Engage patient in aftercare planning with referrals and resources, Facilitate patient progression through stages of change regarding substance use diagnoses and concerns and Identify triggers associated with mental health/substance abuse issues  Therapeutic Interventions: Assess for all discharge needs, 1 to 1 time with Social worker, Explore available resources and support systems, Assess for adequacy in community support network, Educate family and significant other(s) on suicide prevention, Complete Psychosocial Assessment, Interpersonal group therapy.  Evaluation of Outcomes: Adequate for Discharge   Progress in Treatment: Attending groups: Yes Participating in groups: Yes--pt tends to isolate in room but when encouraged, will attend group with minimal participation.  Taking medication as prescribed: Yes. Toleration medication: Yes. Family/Significant other contact made: Collateral contact obtained by Dr. Jeronimo GreavesEktar by pt's father; pt declined to consent to SPE contact with family. SPE completed with patient.  Patient understands diagnosis:  Somewhat--pt understands that AVH is not "normal" and is open to medication management and counseling to help him with these symptoms.  Discussing patient identified problems/goals with staff: Yes. Medical problems stabilized or resolved: Yes. Denies suicidal/homicidal ideation: Yes, self report.  Issues/concerns  per patient self-inventory: No. Other: n/a   New problem(s) identified: No, Describe:  n/a  New Short Term/Long Term Goal(s): medication stabilization, detox; development of comprehensive mental wellness/sobriety plan.   Discharge Plan or Barriers: [facility]/possibly [facility] for medication management due to symptoms of psychosis. Pt plans to either return with family or [facility] and was planning to [facility] for interviews yesterday evening. CSW continuing to assess.   Reason for Continuation of Hospitalization:   Estimated Length of Stay: D/C today   Attendees: Patient: 10/09/2016 10:18 AM  Physician: Janett Billow MD 10/09/2016 10:18 AM  Nursing: Esperanza Sheets, RN 10/09/2016 10:18 AM  RN Care Manager: Onnie Boer CM 10/09/2016 10:18 AM  Social Worker: Richelle Ito, MSW  10/09/2016 10:18 AM  Recreational Therapist: Aggie Cosier 10/09/2016 10:18 AM  Other: Gray Bernhardt NP; Armandina Stammer NP 10/09/2016 10:18 AM  Other:  10/09/2016 10:18 AM  Other: 10/09/2016 10:18 AM    Scribe for Treatment Team: Ida Rogue, LCSW 10/09/2016 10:18 AM
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
## 2016-10-09 NOTE — Progress Notes (Signed)
Patient discharged to lobby. Patient was stable and appreciative at that time. All papers, samples and prescriptions were given and valuables returned. Verbal understanding expressed. Denies SI/HI and A/VH. Patient given opportunity to express concerns and ask questions.  
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
## 2016-10-09 NOTE — Progress Notes (Signed)
Recreation Therapy Notes  Date: 10/09/16 Time: 1000 Location: 500 Hall Dayroom  Group Topic: Leisure Education  Goal Area(s) Addresses:  Patient will identify positive leisure activities.  Patient will identify one positive benefit of participation in leisure activities.   Behavioral Response: Engaged  Intervention: Various activities on pieces of paper, dry erase marker, dry erase board, eraser  Activity: Leisure Pictionary.  Patients were to pick an activity from the can.  Patients were to then draw a picture of the activity on the board. The remaining patients were to try and guess what the picture was.  The first person to guess the picture correctly would then get a chance to go next.  Education:  Leisure Education, Building control surveyorDischarge Planning  Education Outcome: Acknowledges education/In group clarification offered/Needs additional education  Clinical Observations/Feedback: Pt was very social and active during group.  Pt had to be redirected for getting off task by doing impersonations of wrestlers during group.  Pt was able to focus when he had to draw a picture and somewhat so when he had to guess the drawings of his peers.  Caroll RancherMarjette Tayvon Culley, LRT/CTRS         Caroll RancherLindsay, [name] A 10/09/2016 11:42 AM

## 2016-10-09 NOTE — Progress Notes (Signed)
  [facility] :  Will you be returning to the same living situation after discharge:  Yes,  home At discharge, do you have transportation home?: Yes,  cousin Do you have the ability to pay for your medications: Yes,  mental health  Release of information consent forms completed and in the chart;  Patient's signature needed at discharge.  Patient to Follow up at: Follow-up Information    [facility] Follow [facility].   Why:  Walk in for assessment for [facility]/therapy within 7 days of hospital discharge. Walk in hours: Monday, Wednesday, Fridays 12:30PM-3:00PM. Thank you.  Contact information: [address] [place], [place] [address] Phone: [phone] Fax: [phone]       [facility]   Specialty:  Behavioral Health Why:  Walk in within 7 days of hospital discharge. Walk in hours 8am-9am Monday through Friday for medication management. Thank you.  Contact information: [address] [place] [address] [phone]           Next level of care provider has access to [facility] Link:no  Safety Planning and Suicide Prevention discussed: Yes,  yes  Have you used any form of tobacco in the last 30 days? (Cigarettes, Smokeless Tobacco, Cigars, and/or Pipes): Yes  Has patient been referred to the Quitline?: Patient refused referral  Patient has been referred for addiction treatment: Yes  Stuart Rivers 10/09/2016, 10:16 AM

## 2017-01-20 ENCOUNTER — Emergency Department (HOSPITAL_COMMUNITY)
Admission: EM | Admit: 2017-01-20 | Discharge: 2017-01-20 | Disposition: A | Discharge status: Home / Self Care | Payer: Self-pay | Attending: Emergency Medicine | Admitting: Emergency Medicine

## 2017-01-20 ENCOUNTER — Emergency Department (HOSPITAL_COMMUNITY): Payer: Self-pay

## 2017-01-20 ENCOUNTER — Encounter (HOSPITAL_COMMUNITY): Payer: Self-pay | Admitting: *Deleted

## 2017-01-20 DIAGNOSIS — R55 Syncope and collapse: Secondary | ICD-10-CM | POA: Insufficient documentation

## 2017-01-20 DIAGNOSIS — Y999 Unspecified external cause status: Secondary | ICD-10-CM | POA: Insufficient documentation

## 2017-01-20 DIAGNOSIS — Y939 Activity, unspecified: Secondary | ICD-10-CM | POA: Insufficient documentation

## 2017-01-20 DIAGNOSIS — F17290 Nicotine dependence, other tobacco product, uncomplicated: Secondary | ICD-10-CM | POA: Insufficient documentation

## 2017-01-20 DIAGNOSIS — W19XXXA Unspecified fall, initial encounter: Secondary | ICD-10-CM | POA: Insufficient documentation

## 2017-01-20 DIAGNOSIS — Y929 Unspecified place or not applicable: Secondary | ICD-10-CM | POA: Insufficient documentation

## 2017-01-20 DIAGNOSIS — R0789 Other chest pain: Secondary | ICD-10-CM | POA: Insufficient documentation

## 2017-01-20 DIAGNOSIS — Z79899 Other long term (current) drug therapy: Secondary | ICD-10-CM | POA: Insufficient documentation

## 2017-01-20 LAB — CBC WITH DIFFERENTIAL/PLATELET
BASOS ABS: 0 10*3/uL (ref 0.0–0.1)
BASOS PCT: 0 %
EOS ABS: 0 10*3/uL (ref 0.0–0.7)
Eosinophils Relative: 0 %
HCT: 46.9 % (ref 39.0–52.0)
HEMOGLOBIN: 15.1 g/dL (ref 13.0–17.0)
Lymphocytes Relative: 27 %
Lymphs Abs: 3.5 10*3/uL (ref 0.7–4.0)
MCH: 27.7 pg (ref 26.0–34.0)
MCHC: 32.2 g/dL (ref 30.0–36.0)
MCV: 86.1 fL (ref 78.0–100.0)
MONO ABS: 0.8 10*3/uL (ref 0.1–1.0)
Monocytes Relative: 6 %
NEUTROS ABS: 8.6 10*3/uL — AB (ref 1.7–7.7)
NEUTROS PCT: 67 %
Platelets: 227 10*3/uL (ref 150–400)
RBC: 5.45 MIL/uL (ref 4.22–5.81)
RDW: 13.6 % (ref 11.5–15.5)
WBC: 12.9 10*3/uL — ABNORMAL HIGH (ref 4.0–10.5)

## 2017-01-20 LAB — BASIC METABOLIC PANEL
ANION GAP: 12 (ref 5–15)
BUN: 8 mg/dL (ref 6–20)
CALCIUM: 9.6 mg/dL (ref 8.9–10.3)
CO2: 19 mmol/L — AB (ref 22–32)
CREATININE: 1.36 mg/dL — AB (ref 0.61–1.24)
Chloride: 109 mmol/L (ref 101–111)
GFR calc non Af Amer: >60 mL/min (ref >60)
Glucose, Bld: 109 mg/dL — ABNORMAL HIGH (ref 65–99)
Potassium: 3.4 mmol/L — ABNORMAL LOW (ref 3.5–5.1)
SODIUM: 140 mmol/L (ref 135–145)

## 2017-01-20 LAB — D-DIMER, QUANTITATIVE (NOT AT ARMC)

## 2017-01-20 LAB — I-STAT TROPONIN, ED: TROPONIN I, POC: 0 ng/mL (ref 0.00–0.08)

## 2017-01-20 MED ORDER — SODIUM CHLORIDE 0.9 % IV BOLUS (SEPSIS)
1000.0000 mL | Freq: Once | INTRAVENOUS | Status: AC
  Administered 2017-01-20: 1000 mL via INTRAVENOUS

## 2017-01-20 MED ORDER — KETOROLAC TROMETHAMINE 15 MG/ML IJ SOLN
15.0000 mg | Freq: Once | INTRAMUSCULAR | Status: AC
  Administered 2017-01-20: 15 mg via INTRAVENOUS
  Filled 2017-01-20: qty 1

## 2017-01-20 NOTE — Discharge Instructions (Signed)
You were seen today for chest pain and passing out.  Your workup is reassuring.  Follow-up with her primary doctor. He will also be given cardiology follow-up. You may need Holter monitoring. If your symptoms worsen or you have any new or worsening symptoms you need to be reevaluated.
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
## 2017-01-20 NOTE — ED Notes (Addendum)
Pt taken to CT; will start fluids when pt returns

## 2017-03-21 ENCOUNTER — Emergency Department (HOSPITAL_COMMUNITY): Payer: Medicaid Other

## 2017-03-21 ENCOUNTER — Emergency Department (HOSPITAL_COMMUNITY)
Admission: EM | Admit: 2017-03-21 | Discharge: 2017-03-22 | Disposition: A | Discharge status: Home / Self Care | Payer: Medicaid Other | Attending: Physician Assistant | Admitting: Physician Assistant

## 2017-03-21 ENCOUNTER — Encounter (HOSPITAL_COMMUNITY): Payer: Self-pay | Admitting: Emergency Medicine

## 2017-03-21 DIAGNOSIS — S022XXA Fracture of nasal bones, initial encounter for closed fracture: Secondary | ICD-10-CM

## 2017-03-21 DIAGNOSIS — R52 Pain, unspecified: Secondary | ICD-10-CM

## 2017-03-21 DIAGNOSIS — F419 Anxiety disorder, unspecified: Secondary | ICD-10-CM | POA: Insufficient documentation

## 2017-03-21 DIAGNOSIS — F329 Major depressive disorder, single episode, unspecified: Secondary | ICD-10-CM | POA: Insufficient documentation

## 2017-03-21 DIAGNOSIS — S40012A Contusion of left shoulder, initial encounter: Secondary | ICD-10-CM | POA: Insufficient documentation

## 2017-03-21 DIAGNOSIS — F1420 Cocaine dependence, uncomplicated: Secondary | ICD-10-CM | POA: Insufficient documentation

## 2017-03-21 DIAGNOSIS — F17290 Nicotine dependence, other tobacco product, uncomplicated: Secondary | ICD-10-CM | POA: Insufficient documentation

## 2017-03-21 DIAGNOSIS — Y9389 Activity, other specified: Secondary | ICD-10-CM | POA: Insufficient documentation

## 2017-03-21 DIAGNOSIS — S40011A Contusion of right shoulder, initial encounter: Secondary | ICD-10-CM | POA: Insufficient documentation

## 2017-03-21 DIAGNOSIS — F1220 Cannabis dependence, uncomplicated: Secondary | ICD-10-CM | POA: Insufficient documentation

## 2017-03-21 DIAGNOSIS — T07XXXA Unspecified multiple injuries, initial encounter: Secondary | ICD-10-CM

## 2017-03-21 DIAGNOSIS — Z79899 Other long term (current) drug therapy: Secondary | ICD-10-CM | POA: Insufficient documentation

## 2017-03-21 DIAGNOSIS — Y998 Other external cause status: Secondary | ICD-10-CM | POA: Insufficient documentation

## 2017-03-21 DIAGNOSIS — Y929 Unspecified place or not applicable: Secondary | ICD-10-CM | POA: Insufficient documentation

## 2017-03-21 DIAGNOSIS — M25531 Pain in right wrist: Secondary | ICD-10-CM | POA: Insufficient documentation

## 2017-03-21 DIAGNOSIS — R079 Chest pain, unspecified: Secondary | ICD-10-CM | POA: Insufficient documentation

## 2017-03-21 DIAGNOSIS — S62639A Displaced fracture of distal phalanx of unspecified finger, initial encounter for closed fracture: Secondary | ICD-10-CM

## 2017-03-21 DIAGNOSIS — S62601A Fracture of unspecified phalanx of left index finger, initial encounter for closed fracture: Secondary | ICD-10-CM | POA: Insufficient documentation

## 2017-03-21 LAB — CBC
HCT: 45.3 % (ref 39.0–52.0)
Hemoglobin: 15 g/dL (ref 13.0–17.0)
MCH: 28.6 pg (ref 26.0–34.0)
MCHC: 33.1 g/dL (ref 30.0–36.0)
MCV: 86.5 fL (ref 78.0–100.0)
PLATELETS: 226 10*3/uL (ref 150–400)
RBC: 5.24 MIL/uL (ref 4.22–5.81)
RDW: 14.5 % (ref 11.5–15.5)
WBC: 10.9 10*3/uL — AB (ref 4.0–10.5)

## 2017-03-21 LAB — I-STAT TROPONIN, ED: Troponin i, poc: 0.01 ng/mL (ref 0.00–0.08)

## 2017-03-21 LAB — BASIC METABOLIC PANEL
Anion gap: 8 (ref 5–15)
BUN: 16 mg/dL (ref 6–20)
CALCIUM: 9.5 mg/dL (ref 8.9–10.3)
CO2: 26 mmol/L (ref 22–32)
Chloride: 106 mmol/L (ref 101–111)
Creatinine, Ser: 1.3 mg/dL — ABNORMAL HIGH (ref 0.61–1.24)
GFR calc Af Amer: >60 mL/min (ref >60)
GLUCOSE: 105 mg/dL — AB (ref 65–99)
POTASSIUM: 3.8 mmol/L (ref 3.5–5.1)
SODIUM: 140 mmol/L (ref 135–145)

## 2017-03-21 MED ORDER — IOPAMIDOL (ISOVUE-300) INJECTION 61%
INTRAVENOUS | Status: AC
  Administered 2017-03-22: 100 mL
  Filled 2017-03-21: qty 100

## 2017-03-21 MED ORDER — OXYCODONE-ACETAMINOPHEN 5-325 MG PO TABS
2.0000 | ORAL_TABLET | Freq: Once | ORAL | Status: AC
  Administered 2017-03-21: 2 via ORAL
  Filled 2017-03-21: qty 2

## 2017-03-21 NOTE — ED Notes (Signed)
Pt states he is leaving because someone told him there was a 4 hour wait for a CT scan.  Informed pt that the longest wait is currently 2 1/2 hours and that his lab work will be resulted by the time he gets to a room as well as his CT scan.  Pt states he was told it was 4 hours to get the CT.  I told him they should be here to get him soon.  Pt agreed to wait longer.  Radiology now here to get pt.
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
## 2017-03-21 NOTE — ED Notes (Signed)
EKG delayed due to no open rooms in Triage.

## 2017-03-22 ENCOUNTER — Emergency Department (HOSPITAL_COMMUNITY): Payer: Medicaid Other

## 2017-03-22 ENCOUNTER — Encounter (HOSPITAL_COMMUNITY): Payer: Self-pay

## 2017-03-22 LAB — HEPATIC FUNCTION PANEL
ALBUMIN: 4.2 g/dL (ref 3.5–5.0)
ALK PHOS: 64 U/L (ref 38–126)
ALT: 23 U/L (ref 17–63)
AST: 45 U/L — AB (ref 15–41)
BILIRUBIN TOTAL: 0.9 mg/dL (ref 0.3–1.2)
Bilirubin, Direct: 0.2 mg/dL (ref 0.1–0.5)
Indirect Bilirubin: 0.7 mg/dL (ref 0.3–0.9)
TOTAL PROTEIN: 6.9 g/dL (ref 6.5–8.1)

## 2017-03-22 LAB — LIPASE, BLOOD: LIPASE: 49 U/L (ref 11–51)

## 2017-03-22 MED ORDER — NAPROXEN 500 MG PO TABS: 500.0000 mg | ORAL_TABLET | Freq: Two times a day (BID) | ORAL | 0 refills | Status: DC

## 2017-03-22 MED ORDER — METHOCARBAMOL 500 MG PO TABS: 500.0000 mg | ORAL_TABLET | Freq: Two times a day (BID) | ORAL | 0 refills | Status: DC

## 2017-03-22 NOTE — ED Notes (Signed)
Pt departed in NAD. Pt states pain in arms is still 10/10 but does not wish to remain for any further evaluation.

## 2017-03-22 NOTE — ED Provider Notes (Signed)
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 (NAPROSYN) 500 MG tablet Take 1 tablet (500 mg total) by mouth 2 (two) times daily with a meal., Starting Sat 03/22/2017, Print         Lashayla Armes, Boyd Kerbs 03/22/17 0421    Abelino Derrick, MD 03/23/17 [phone]

## 2017-03-22 NOTE — Discharge Instructions (Signed)
1. Medications: alternate naprosyn and tylenol for pain control, robaxin for muscle spasms, usual home medications 2. Treatment: rest, ice, elevate and use brace, drink plenty of fluids, gentle stretching 3. Follow Up: Please followup with ENT and PCP as directed for discussion of your diagnoses and further evaluation after today's visit; if you do not have a primary care doctor use the resource guide provided to find one; Please return to the ER for worsening symptoms or other concerns

## 2017-04-30 ENCOUNTER — Encounter (HOSPITAL_COMMUNITY): Payer: Self-pay | Admitting: *Deleted

## 2017-04-30 ENCOUNTER — Encounter (HOSPITAL_COMMUNITY): Payer: Self-pay | Admitting: Emergency Medicine

## 2017-04-30 ENCOUNTER — Emergency Department (HOSPITAL_COMMUNITY)
Admission: EM | Admit: 2017-04-30 | Discharge: 2017-04-30 | Disposition: A | Discharge status: Other Acute Inpatient Hospital | Payer: Federal, State, Local not specified - Other | Attending: Emergency Medicine | Admitting: Emergency Medicine

## 2017-04-30 ENCOUNTER — Inpatient Hospital Stay (HOSPITAL_COMMUNITY)
Admission: RE | Admit: 2017-04-30 | Discharge: 2017-05-06 | DRG: 885 | Disposition: A | Discharge status: Home / Self Care | Payer: Federal, State, Local not specified - Other | Attending: Psychiatry | Admitting: Psychiatry

## 2017-04-30 DIAGNOSIS — R45851 Suicidal ideations: Secondary | ICD-10-CM | POA: Insufficient documentation

## 2017-04-30 DIAGNOSIS — Z915 Personal history of self-harm: Secondary | ICD-10-CM

## 2017-04-30 DIAGNOSIS — F4310 Post-traumatic stress disorder, unspecified: Secondary | ICD-10-CM | POA: Diagnosis present

## 2017-04-30 DIAGNOSIS — F333 Major depressive disorder, recurrent, severe with psychotic symptoms: Secondary | ICD-10-CM | POA: Insufficient documentation

## 2017-04-30 DIAGNOSIS — F17210 Nicotine dependence, cigarettes, uncomplicated: Secondary | ICD-10-CM | POA: Diagnosis present

## 2017-04-30 DIAGNOSIS — G4700 Insomnia, unspecified: Secondary | ICD-10-CM | POA: Diagnosis present

## 2017-04-30 DIAGNOSIS — Z590 Homelessness: Secondary | ICD-10-CM | POA: Diagnosis not present

## 2017-04-30 DIAGNOSIS — Z046 Encounter for general psychiatric examination, requested by authority: Secondary | ICD-10-CM | POA: Insufficient documentation

## 2017-04-30 DIAGNOSIS — Z91410 Personal history of adult physical and sexual abuse: Secondary | ICD-10-CM

## 2017-04-30 DIAGNOSIS — Z79899 Other long term (current) drug therapy: Secondary | ICD-10-CM | POA: Insufficient documentation

## 2017-04-30 DIAGNOSIS — F17290 Nicotine dependence, other tobacco product, uncomplicated: Secondary | ICD-10-CM | POA: Insufficient documentation

## 2017-04-30 DIAGNOSIS — F515 Nightmare disorder: Secondary | ICD-10-CM | POA: Diagnosis not present

## 2017-04-30 DIAGNOSIS — F14251 Cocaine dependence with cocaine-induced psychotic disorder with hallucinations: Secondary | ICD-10-CM | POA: Diagnosis not present

## 2017-04-30 DIAGNOSIS — Z818 Family history of other mental and behavioral disorders: Secondary | ICD-10-CM | POA: Diagnosis not present

## 2017-04-30 DIAGNOSIS — Z634 Disappearance and death of family member: Secondary | ICD-10-CM | POA: Diagnosis not present

## 2017-04-30 DIAGNOSIS — F1910 Other psychoactive substance abuse, uncomplicated: Secondary | ICD-10-CM | POA: Diagnosis present

## 2017-04-30 DIAGNOSIS — Z9114 Patient's other noncompliance with medication regimen: Secondary | ICD-10-CM | POA: Insufficient documentation

## 2017-04-30 LAB — RAPID URINE DRUG SCREEN, HOSP PERFORMED
Amphetamines: NOT DETECTED
Barbiturates: NOT DETECTED
Benzodiazepines: NOT DETECTED
Cocaine: POSITIVE — AB
Opiates: NOT DETECTED
Tetrahydrocannabinol: POSITIVE — AB

## 2017-04-30 LAB — COMPREHENSIVE METABOLIC PANEL
ALT: 45 U/L (ref 17–63)
AST: 34 U/L (ref 15–41)
Albumin: 4 g/dL (ref 3.5–5.0)
Alkaline Phosphatase: 59 U/L (ref 38–126)
Anion gap: 7 (ref 5–15)
BUN: 18 mg/dL (ref 6–20)
CO2: 24 mmol/L (ref 22–32)
Calcium: 9.3 mg/dL (ref 8.9–10.3)
Chloride: 107 mmol/L (ref 101–111)
Creatinine, Ser: 1.46 mg/dL — ABNORMAL HIGH (ref 0.61–1.24)
GFR calc Af Amer: >60 mL/min (ref >60)
GFR calc non Af Amer: >60 mL/min (ref >60)
Glucose, Bld: 166 mg/dL — ABNORMAL HIGH (ref 65–99)
Potassium: 3.9 mmol/L (ref 3.5–5.1)
Sodium: 138 mmol/L (ref 135–145)
Total Bilirubin: 1.2 mg/dL (ref 0.3–1.2)
Total Protein: 7.2 g/dL (ref 6.5–8.1)

## 2017-04-30 LAB — CBC
HCT: 45.2 % (ref 39.0–52.0)
Hemoglobin: 15.1 g/dL (ref 13.0–17.0)
MCH: 28.7 pg (ref 26.0–34.0)
MCHC: 33.4 g/dL (ref 30.0–36.0)
MCV: 85.9 fL (ref 78.0–100.0)
Platelets: 219 10*3/uL (ref 150–400)
RBC: 5.26 MIL/uL (ref 4.22–5.81)
RDW: 14.5 % (ref 11.5–15.5)
WBC: 8.5 10*3/uL (ref 4.0–10.5)

## 2017-04-30 LAB — ACETAMINOPHEN LEVEL: Acetaminophen (Tylenol), Serum: <10 ug/mL — ABNORMAL LOW (ref 10–30)

## 2017-04-30 LAB — SALICYLATE LEVEL: Salicylate Lvl: <7 mg/dL (ref 2.8–30.0)

## 2017-04-30 LAB — ETHANOL: Alcohol, Ethyl (B): <5 mg/dL (ref <5)

## 2017-04-30 MED ORDER — TRAZODONE HCL 50 MG PO TABS
50.0000 mg | ORAL_TABLET | Freq: Every evening | ORAL | Status: DC | PRN
  Administered 2017-04-30 – 2017-05-05 (×6): 50 mg via ORAL
  Filled 2017-04-30 (×5): qty 1
  Filled 2017-04-30 (×2): qty 7
  Filled 2017-04-30: qty 1

## 2017-04-30 MED ORDER — NICOTINE 21 MG/24HR TD PT24
21.0000 mg | MEDICATED_PATCH | Freq: Every day | TRANSDERMAL | Status: DC
  Administered 2017-05-01 – 2017-05-06 (×6): 21 mg via TRANSDERMAL
  Filled 2017-04-30 (×8): qty 1

## 2017-04-30 MED ORDER — ACETAMINOPHEN 325 MG PO TABS
650.0000 mg | ORAL_TABLET | Freq: Four times a day (QID) | ORAL | Status: DC | PRN
  Administered 2017-05-01 – 2017-05-05 (×3): 650 mg via ORAL
  Filled 2017-04-30 (×3): qty 2

## 2017-04-30 MED ORDER — ALUM & MAG HYDROXIDE-SIMETH 200-200-20 MG/5ML PO SUSP: 30.0000 mL | ORAL | Status: DC | PRN

## 2017-04-30 MED ORDER — MAGNESIUM HYDROXIDE 400 MG/5ML PO SUSP: 30.0000 mL | Freq: Every day | ORAL | Status: DC | PRN

## 2017-04-30 MED ORDER — HYDROXYZINE HCL 25 MG PO TABS
25.0000 mg | ORAL_TABLET | Freq: Four times a day (QID) | ORAL | Status: DC | PRN
  Administered 2017-04-30 – 2017-05-05 (×6): 25 mg via ORAL
  Filled 2017-04-30: qty 10
  Filled 2017-04-30 (×5): qty 1
  Filled 2017-04-30: qty 10
  Filled 2017-04-30: qty 1

## 2017-04-30 NOTE — ED Notes (Signed)
Bed: [facility] Expected date:  Expected time:  Means of arrival:  Comments: 

## 2017-04-30 NOTE — ED Notes (Signed)
[facility] Expected date:  Expected time:  Means of arrival:  Comments: Tri 3

## 2017-04-30 NOTE — Progress Notes (Signed)
04/30/17 1357:  LRT went to pt room to offer activities, pt was sleep.   Caroll RancherMarjette Nunzio Banet, LRT/CTRS

## 2017-04-30 NOTE — Tx Team (Signed)
Initial Treatment Plan 04/30/2017 8:21 PM Stuart Rivers [id number]    PATIENT STRESSORS: Financial difficulties Marital or family conflict Occupational concerns Substance abuse Traumatic event   PATIENT STRENGTHS: Ability for insight Average or above average intelligence Communication skills Motivation for treatment/growth Physical Health Supportive family/friends   PATIENT IDENTIFIED PROBLEMS: At risk for suicide  Substance Abuse  "Get sober"  "Longterm treatment"               DISCHARGE CRITERIA:  Ability to meet basic life and health needs Improved stabilization in mood, thinking, and/or behavior Motivation to continue treatment in a less acute level of care Need for constant or close observation no longer present Withdrawal symptoms are absent or subacute and managed without 24-hour nursing intervention  PRELIMINARY DISCHARGE PLAN: Attend 12-step recovery group Outpatient therapy Placement in alternative living arrangements Return to previous work or school arrangements  PATIENT/FAMILY INVOLVEMENT: This treatment plan has been presented to and reviewed with the patient, Stuart Rivers.  The patient and family have been given the opportunity to ask questions and make suggestions.  Carleene OverlieMiddleton, [name], RN 04/30/2017, 8:21 PM

## 2017-04-30 NOTE — BH Assessment (Signed)
[facility] Assessment Progress Note  Per Thedore MinsMojeed Akintayo, MD, this pt requires psychiatric hospitalization at this time.  Malva LimesLinsey Strader, RN, [id number]; they will be ready to receive pt at 17:00.  Pt has signed Voluntary Admission and Consent for Treatment, as well as Consent to Release Information to no one, and signed [facility].  Pt's nurse, Kendal Hymendie, has been notified, and agrees to send original paperwork along with pt via Juel Burrowelham, and to call report to [phone].  Doylene Canninghomas Vennela Jutte, MA Triage Specialist [phone]

## 2017-04-30 NOTE — BH Assessment (Signed)
Assessment Note  Stuart Rivers is an 26 y.o. male with history of depression and anxiety. He presents to [facility] by his grandmother voluntarily. He has suicidal thoughts. He tried to walk in traffic this morning. Patient denies triggers for his suicide attempt today. He also tried to walk in traffic a few months ago and again doesn't know what may have triggered this suicide attempt. Denies self mutilating behaviors. He does admits to feelings of depression including hopelessness, loss of interest in usual pleasures, and crying spells. He denies self mutilating behaviors. He denies HI. No history of aggressive or assaultive behaviors. No legal issues. He denies current AVH's. He however admits that he occasionally hears voices that tell him to kill myself. He reports regular use of alcohol, cocaine, and THC. He last used all substances last night. [facility]. He was discharge home with medications and a follow recommendations for a outpatient therapist/psychiatrist. Patient never followed up nor did he take any medications after discharge. He is unable to explain the reason for non compliance. He considers his grandparents as his support system. He denies a history of sexual, physical, and/or emotional abuse. He lives with his grandparents. Highest level of education is some college.   Diagnosis: Major Depressive Disorder, Recurrent, Severe, without psychotic features and Anxiety Disorder   Past Medical History:  Past Medical History:  Diagnosis Date  . Anxiety   . Depression   . No pertinent past medical history     Past Surgical History:  Procedure Laterality Date  . CLOSED REDUCTION NASAL FRACTURE  04/07/2012   Procedure: CLOSED REDUCTION NASAL FRACTURE;  Surgeon: Wayland Denislaire Sanger, DO;  Location: [facility] OR;  Service: Plastics;  Laterality: N/A;  . NO PAST SURGERIES    . ORIF TIBIA FRACTURE  04/05/2012   Procedure: OPEN REDUCTION INTERNAL FIXATION (ORIF)  TIBIA FRACTURE;  Surgeon: Shelda PalMatthew D Olin, MD;  [facility];  Service: Orthopedics;  Laterality: Left;    Family History:  Family History  Problem Relation Age of Onset  . Arthritis Unknown        grandparent  . Breast cancer Other   . Heart disease Unknown        grandparent  . Hypertension Unknown        grandparent  . Diabetes Unknown        parent  . Diabetes Unknown        grandparent    Social History:  reports that he has been smoking Cigars.  He has never used smokeless tobacco. He reports that he drinks alcohol. He reports that he uses drugs, including Marijuana and "Crack" cocaine.  Additional Social History:  Alcohol / Drug Use Pain Medications: pt denies abuse - see pta meds list Prescriptions: pt denies abuse - see pta meds list Over the Counter: pt denies abuse - see pta meds list History of alcohol / drug use?: Yes Substance #1 Name of Substance 1: Cocaine 1 - Age of First Use: 26 yrs old  1 - Amount (size/oz): $40-$60 1 - Frequency: weekends 1 - Duration: on-going  1 - Last Use / Amount: "last night" Substance #2 Name of Substance 2: Alcohol  2 - Age of First Use: 26 yrs old  2 - Amount (size/oz): 3 beers  2 - Frequency: daily  2 - Duration: on-going  2 - Last Use / Amount: "last night" Substance #3 Name of Substance 3: marijuana 3 - Age of First  Use: 8 3 - Amount (size/oz): 5 blunts 3 - Frequency: daily 3 - Duration: on-going  3 - Last Use / Amount: "last night"  CIWA: CIWA-Ar BP: 109/61 Pulse Rate: 92 COWS:    Allergies: No Known Allergies  Home Medications:  (Not in a hospital admission)  OB/GYN Status:  No LMP for male patient.  General Assessment Data TTS Assessment: In system Is this a Tele or Face-to-Face Assessment?: Face-to-Face Is this an Initial Assessment or a Re-assessment for this encounter?: Initial Assessment Marital status: Single Maiden name:  (n/a) Is patient pregnant?: No Pregnancy Status: No Living  Arrangements: Other (Comment) Can pt return to current living arrangement?: No Admission Status: Voluntary Is patient capable of signing voluntary admission?: Yes Referral Source: Self/Family/Friend     Crisis Care Plan Living Arrangements: Other (Comment) Legal Guardian: Other: (no legal guardian ) Name of Psychiatrist:  (no psychiatrist ) Name of Therapist:  (no therapist )  Education Status Is patient currently in school?: No Current Grade:  (n/a) Highest grade of school patient has completed:  (n/a) Name of school:  (n/a) Contact person:  (n/a)  Risk to self with the past 6 months Suicidal Ideation: Yes-Currently Present Has patient been a risk to self within the past 6 months prior to admission? : Yes Suicidal Intent: Yes-Currently Present Has patient had any suicidal intent within the past 6 months prior to admission? : Yes Is patient at risk for suicide?: Yes Suicidal Plan?: Yes-Currently Present Has patient had any suicidal plan within the past 6 months prior to admission? : Yes Specify Current Suicidal Plan:  (walk in traffic on the highway ) Access to Means: Yes Specify Access to Suicidal Means:  (access to the high way ) What has been your use of drugs/alcohol within the last 12 months?:  (patient reports cocaine and alcohol use ) Previous Attempts/Gestures: Yes How many times?:  (2x's ) Other Self Harm Risks:  (no self harm risk) Triggers for Past Attempts: Other (Comment) (no triggers for past attempts or gestures ) Intentional Self Injurious Behavior: None Family Suicide History: No Persecutory voices/beliefs?: No Depression: Yes Depression Symptoms: Feeling angry/irritable, Feeling worthless/self pity, Loss of interest in usual pleasures, Fatigue, Tearfulness, Isolating Substance abuse history and/or treatment for substance abuse?: No Suicide prevention information given to non-admitted patients: Not applicable  Risk to Others within the past 6  months Homicidal Ideation: No Does patient have any lifetime risk of violence toward others beyond the six months prior to admission? : No Thoughts of Harm to Others: No Current Homicidal Intent: No Current Homicidal Plan: No Access to Homicidal Means: No Identified Victim:  (n/a) History of harm to others?: No Assessment of Violence: None Noted Violent Behavior Description:  (patient is calm and cooperative ) Does patient have access to weapons?: No Criminal Charges Pending?: No Does patient have a court date: No Is patient on probation?: No  Psychosis Hallucinations: Auditory ("Kill yourself") Delusions: None noted  Mental Status Report Appearance/Hygiene: Disheveled Eye Contact: Good Motor Activity: Freedom of movement Speech: Logical/coherent Level of Consciousness: Alert Mood: Depressed Affect: Appropriate to circumstance Judgement: Impaired Orientation: Person, Place, Time, Situation Obsessive Compulsive Thoughts/Behaviors: None  Cognitive Functioning Concentration: Decreased Memory: Recent Intact, Remote Intact IQ: Average Insight: Poor Impulse Control: Poor Appetite: Fair Weight Loss:  (none reported) Weight Gain:  (none reported ) Sleep: Decreased Total Hours of Sleep:  ("I don't sleep much") Vegetative Symptoms: None  ADLScreening [facility] Assessment Services) Patient's cognitive ability adequate to safely complete daily activities?:  Yes Patient able to express need for assistance with ADLs?: Yes Independently performs ADLs?: Yes (appropriate for developmental age)  Prior Inpatient Therapy Prior Inpatient Therapy: Yes Prior Therapy Dates:  ("a few months ago") Prior Therapy Facilty/Provider(s):  [facility]) Reason for Treatment:  (suicide attempt; Bipolar; Schizophrenia; Manic Depression )  Prior Outpatient Therapy Prior Outpatient Therapy: No Prior Therapy Dates:  (n/a) Prior Therapy Facilty/Provider(s):  (n/a) Reason for Treatment:  (n/a) Does patient  have an ACCT team?: No Does patient have Intensive In-House Services?  : No Does patient have [facility] services? : No Does patient have P4CC services?: No  ADL Screening (condition at time of admission) Patient's cognitive ability adequate to safely complete daily activities?: Yes Is the patient deaf or have difficulty hearing?: No Does the patient have difficulty seeing, even when wearing glasses/contacts?: No Does the patient have difficulty concentrating, remembering, or making decisions?: No Patient able to express need for assistance with ADLs?: Yes Does the patient have difficulty dressing or bathing?: No Independently performs ADLs?: Yes (appropriate for developmental age) Does the patient have difficulty walking or climbing stairs?: No Weakness of Legs: None Weakness of Arms/Hands: None  Home Assistive Devices/Equipment Home Assistive Devices/Equipment: None    Abuse/Neglect Assessment (Assessment to be complete while patient is alone) Physical Abuse: Denies Verbal Abuse: Denies Sexual Abuse: Denies Exploitation of patient/patient's resources: Denies Self-Neglect: Denies Values / Beliefs Cultural Requests During Hospitalization: None Spiritual Requests During Hospitalization: None   Advance Directives (For Healthcare) Does Patient Have a Medical Advance Directive?: No Would patient like information on creating a medical advance directive?: No - Patient declined Nutrition Screen- [facility] Patient's home diet: Regular  Additional Information 1:1 In Past 12 Months?: No CIRT Risk: No Elopement Risk: No Does patient have medical clearance?: Yes     Disposition:  Disposition Initial Assessment Completed for this Encounter: Yes Disposition of Patient: Inpatient treatment program (Per Dr. Jannifer Franklin and Elta Guadeloupe, NP; pt meets INPT) Type of inpatient treatment program: Adult (Patient meets criteria for INPT treatment )  On Site Evaluation by:   Reviewed with  Physician:    Melynda Ripple 04/30/2017 12:21 PM

## 2017-04-30 NOTE — ED Notes (Signed)
Pt oriented to room and unit.  Pt c/o suicidal thoughts.  Pt contracts for safety..  Pt has very flat affect and withdrawn.  15 minute checks and video monitoring in place.
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
## 2017-04-30 NOTE — ED Triage Notes (Signed)
Pt reports auditory hallucination , dictating him to kill himself.  Reports was [place] [address] to attempt suicide, and his grandmother stopped him , drove him to the Ed.  Denies homicidal ideation, denies taking any meds for psych issues . reports Hx SI and hospitalization for this matter. Cal and cooperative at this time.

## 2017-05-01 DIAGNOSIS — R45851 Suicidal ideations: Secondary | ICD-10-CM

## 2017-05-01 DIAGNOSIS — F1910 Other psychoactive substance abuse, uncomplicated: Secondary | ICD-10-CM

## 2017-05-01 DIAGNOSIS — R443 Hallucinations, unspecified: Secondary | ICD-10-CM

## 2017-05-01 DIAGNOSIS — M549 Dorsalgia, unspecified: Secondary | ICD-10-CM

## 2017-05-01 DIAGNOSIS — F17210 Nicotine dependence, cigarettes, uncomplicated: Secondary | ICD-10-CM

## 2017-05-01 DIAGNOSIS — Z818 Family history of other mental and behavioral disorders: Secondary | ICD-10-CM

## 2017-05-01 DIAGNOSIS — F14251 Cocaine dependence with cocaine-induced psychotic disorder with hallucinations: Secondary | ICD-10-CM

## 2017-05-01 DIAGNOSIS — Z811 Family history of alcohol abuse and dependence: Secondary | ICD-10-CM

## 2017-05-01 MED ORDER — QUETIAPINE FUMARATE 25 MG PO TABS
75.0000 mg | ORAL_TABLET | Freq: Every day | ORAL | Status: DC
  Administered 2017-05-01: 75 mg via ORAL
  Filled 2017-05-01 (×3): qty 3

## 2017-05-01 MED ORDER — SERTRALINE HCL 50 MG PO TABS
50.0000 mg | ORAL_TABLET | Freq: Every day | ORAL | Status: DC
  Administered 2017-05-01 – 2017-05-06 (×6): 50 mg via ORAL
  Filled 2017-05-01 (×8): qty 1
  Filled 2017-05-01: qty 7
  Filled 2017-05-01: qty 1

## 2017-05-01 NOTE — Progress Notes (Signed)
D: Patient observed initially isolative to bed this AM, more visible this afternoon. Frequent contacts made 1:1 throughout shift. Patient verbalizes to this Clinical research associatewriter he continues to experience AVH but states this is his baseline. Patient's affect flat, mood depressed. Per self inventory and discussions with writer, rates depression at a 0/10, hopelessness at a 0/10 and anxiety at a 2/10. Rates sleep as fair, appetite as fair, energy as low and concentration as poor.  States goal for today is "getting better." Complained of a headache mid afternoon of a 10/10, no other physical problems.   A: Medicated per orders, prn tylenol given for discomfort. Patient started on zoloft and med education provided. Level III obs in place for safety. Emotional support offered and self inventory reviewed. Encouraged completion of Suicide Safety Plan and programming participation. Discussed POC with MD, SW.   R: Patient verbalizes understanding of POC. On reassess of pain, patient was asleep. Patient denies SI/HI and remains safe on level III obs. Will continue to monitor closely and make verbal contact frequently.

## 2017-05-01 NOTE — Plan of Care (Signed)
Problem: Education: Goal: Knowledge of [facility] Education information/materials will improve Outcome: Completed/Met Date Met: 05/01/17 Patient made aware of [place] policies and general information regarding tx. Verbalizes understanding.
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
## 2017-05-01 NOTE — Progress Notes (Signed)
Psychoeducational Group Note  Date:  04/30/2017 Time:  2045  Group Topic/Focus:  wrap up group  Participation Level: Did Not Attend  Participation Quality:  Not Applicable  Affect:  Not Applicable  Cognitive:  Not Applicable  Insight:  Not Applicable  Engagement in Group: Not Applicable  Additional Comments:  Pt was notified there was a NA meeting and wrap up group but remained in bed.   Johann CapersMcNeil, Dravin Lance S 05/01/2017, 12:57 AM

## 2017-05-01 NOTE — BHH Group Notes (Signed)
LCSW Group Therapy Note  05/01/2017 1:15pm  Type of Therapy/Topic:  Group Therapy:  Emotion Regulation  Participation Level:  Minimal   Description of Group:   The purpose of this group is to assist patients in learning to regulate negative emotions and experience positive emotions. Patients will be guided to discuss ways in which they have been vulnerable to their negative emotions. These vulnerabilities will be juxtaposed with experiences of positive emotions or situations, and patients will be challenged to use positive emotions to combat negative ones. Special emphasis will be placed on coping with negative emotions in conflict situations, and patients will process healthy conflict resolution skills.  Therapeutic Goals: 1. Patient will identify two positive emotions or experiences to reflect on in order to balance out negative emotions 2. Patient will label two or more emotions that they find the most difficult to experience 3. Patient will demonstrate positive conflict resolution skills through discussion and/or role plays  Summary of Patient Progress: Pt did not participate in group discussion but was attentive throughout.     Therapeutic Modalities:   Cognitive Behavioral Therapy Feelings Identification Dialectical Behavioral Therapy   Verdene LennertLauren C Keian Odriscoll, LCSW 05/01/2017 3:10 PM
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
## 2017-05-01 NOTE — Progress Notes (Signed)
Pt was in bed asleep at the beginning of the shift, but came out of his room after 2100 to sit in the dayroom for a little while.  He reports that he is still having suicidal thoughts, but can contract for safety with Clinical research associatewriter.  He denies HI.  He also reports still hearing voices telling him to kill himself.  He presents as flat and depressed.  He was encouraged to make his needs known to staff.  Support and encouragement offered.  Pt was medicated with prns for anxiety and to aid sleep.  Discharge plans are in process.  Safety maintained with q15 minute checks.

## 2017-05-01 NOTE — [facility] Suicide Risk Assessment (Signed)
B[facility] INPATIENT:  Family/Significant Other Suicide Prevention Education  Suicide Prevention Education:  Patient Refusal for Family/Significant Other Suicide Prevention Education: The patient Stuart Rivers has refused to provide written consent for family/significant other to be provided Family/Significant Other Suicide Prevention Education during admission and/or prior to discharge.  Physician notified.  Verdene LennertLauren C Iza Preston 05/01/2017, 11:07 AM

## 2017-05-01 NOTE — Progress Notes (Signed)
Patient attended group and said that his day was a 4. For his coping skills for today, he used playing cards and laugher.

## 2017-05-02 DIAGNOSIS — F419 Anxiety disorder, unspecified: Secondary | ICD-10-CM

## 2017-05-02 DIAGNOSIS — Z634 Disappearance and death of family member: Secondary | ICD-10-CM

## 2017-05-02 DIAGNOSIS — F39 Unspecified mood [affective] disorder: Secondary | ICD-10-CM

## 2017-05-02 DIAGNOSIS — F515 Nightmare disorder: Secondary | ICD-10-CM

## 2017-05-02 DIAGNOSIS — G4700 Insomnia, unspecified: Secondary | ICD-10-CM

## 2017-05-02 DIAGNOSIS — F1290 Cannabis use, unspecified, uncomplicated: Secondary | ICD-10-CM

## 2017-05-02 DIAGNOSIS — F1490 Cocaine use, unspecified, uncomplicated: Secondary | ICD-10-CM

## 2017-05-02 DIAGNOSIS — F333 Major depressive disorder, recurrent, severe with psychotic symptoms: Principal | ICD-10-CM

## 2017-05-02 LAB — BASIC METABOLIC PANEL
ANION GAP: 7 (ref 5–15)
BUN: 17 mg/dL (ref 6–20)
CALCIUM: 8.8 mg/dL — AB (ref 8.9–10.3)
CO2: 22 mmol/L (ref 22–32)
CREATININE: 1.28 mg/dL — AB (ref 0.61–1.24)
Chloride: 108 mmol/L (ref 101–111)
GFR calc Af Amer: >60 mL/min (ref >60)
GLUCOSE: 108 mg/dL — AB (ref 65–99)
Potassium: 4.2 mmol/L (ref 3.5–5.1)
Sodium: 137 mmol/L (ref 135–145)

## 2017-05-02 LAB — LIPID PANEL
CHOL/HDL RATIO: 4 ratio
Cholesterol: 189 mg/dL (ref 0–200)
HDL: 47 mg/dL (ref >40)
LDL CALC: 120 mg/dL — AB (ref 0–99)
Triglycerides: 111 mg/dL (ref <150)
VLDL: 22 mg/dL (ref 0–40)

## 2017-05-02 LAB — HEMOGLOBIN A1C
HEMOGLOBIN A1C: 5.8 % — AB (ref 4.8–5.6)
MEAN PLASMA GLUCOSE: 119.76 mg/dL

## 2017-05-02 MED ORDER — PRAZOSIN HCL 1 MG PO CAPS
1.0000 mg | ORAL_CAPSULE | Freq: Every day | ORAL | Status: DC
  Administered 2017-05-02 – 2017-05-05 (×4): 1 mg via ORAL
  Filled 2017-05-02: qty 7
  Filled 2017-05-02 (×5): qty 1

## 2017-05-02 MED ORDER — QUETIAPINE FUMARATE 100 MG PO TABS
100.0000 mg | ORAL_TABLET | Freq: Every day | ORAL | Status: DC
  Administered 2017-05-02: 100 mg via ORAL
  Filled 2017-05-02 (×3): qty 1

## 2017-05-02 NOTE — Progress Notes (Signed)
Pt has been in the dayroom all evening talking with peers and playing cards.  He appears to be relaxed and at ease while socializing, but when he came to the med window, he reported that he was still anxious and depressed.  He states he is still having passive suicidal thoughts, but can contract for safety.  He is also still hearing voices telling him he should kill himself.  Pt has been pleasant and appropriate on the unit.   He voices no other needs or concerns.  [facility]  Support and encouragement offered.  Discharge plans are in process.  Safety maintained with q15 minute checks.
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
## 2017-05-02 NOTE — Progress Notes (Signed)
D: Patient denies SI or HI but endorses improvement in auditory and visual hallucinations. Patient has a depressed mood with flat affect.  Pt. States that he is feeling better and reports that the hallucinations are becoming more bearable.  Pt. Is active within the milieu and is appropriate and cooperative with staff and others on the unit.    A: Patient given emotional support from RN. Patient encouraged to come to staff with concerns and/or questions. Patient's medication routine continued. Patient's orders and plan of care reviewed.   R: Patient remains appropriate and cooperative. Will continue to monitor patient q15 minutes for safety.

## 2017-05-02 NOTE — [facility] Group Notes (Signed)
LCSW Group Therapy Note  05/02/2017 1:15pm  Type of Therapy and Topic:  Group Therapy:  Feelings around Relapse and Recovery  Participation Level:  Active   Description of Group:    Patients in this group will discuss emotions they experience before and after a relapse. They will process how experiencing these feelings, or avoidance of experiencing them, relates to having a relapse. Facilitator will guide patients to explore emotions they have related to recovery. Patients will be encouraged to process which emotions are more powerful. They will be guided to discuss the emotional reaction significant others in their lives may have to their relapse or recovery. Patients will be assisted in exploring ways to respond to the emotions of others without this contributing to a relapse.  Therapeutic Goals: 1. Patient will identify two or more emotions that lead to a relapse for them 2. Patient will identify two emotions that result when they relapse 3. Patient will identify two emotions related to recovery 4. Patient will demonstrate ability to communicate their needs through discussion and/or role plays   Summary of Patient Progress: Pt identified that his substance use and ongoing relapses keeps him from having a stable romantic relationship which is important to him. Pt was attentive and reports being motivated for treatment.    Therapeutic Modalities:   Cognitive Behavioral Therapy Solution-Focused Therapy Assertiveness Training Relapse Prevention Therapy   Verdene LennertLauren C Kristian Mogg, LCSW 05/02/2017 2:44 PM

## 2017-05-02 NOTE — Progress Notes (Signed)
The patient attended group but refused to share.  

## 2017-05-02 NOTE — Tx Team (Signed)
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 acetaminophen (TYLENOL) tablet 650 mg  650 mg Oral Q6H PRN Ethelene Hal, NP   650 mg at 05/01/17 1425  . alum & mag hydroxide-simeth (MAALOX/MYLANTA) 200-200-20 MG/5ML suspension 30 mL  30 mL Oral Q4H PRN Ethelene Hal, NP      . hydrOXYzine (ATARAX/VISTARIL) tablet 25 mg  25 mg Oral Q6H PRN Ethelene Hal, NP   25 mg at 05/01/17 2103  . magnesium hydroxide (MILK OF MAGNESIA) suspension 30 mL  30 mL Oral Daily PRN Ethelene Hal, NP      . nicotine (NICODERM CQ - dosed in mg/24 hours) patch 21 mg  21 mg Transdermal Daily Patriciaann Clan E, PA-C   21 mg at 05/02/17 0925  . QUEtiapine (SEROQUEL) tablet 75 mg  75 mg Oral QHS Cobos, Myer Peer, MD   75 mg at 05/01/17 2102  . sertraline (ZOLOFT) tablet 50 mg  50 mg Oral Daily Cobos, Myer Peer, MD   50 mg at 05/02/17 0923  . traZODone (DESYREL) tablet 50 mg  50 mg Oral QHS PRN Ethelene Hal, NP   50 mg at 05/01/17 2102    PTA Medications: Prescriptions Prior to Admission  Medication Sig Dispense Refill Last Dose  . doxepin (SINEQUAN) 10 MG capsule Take 1 capsule (10 mg total) by mouth at bedtime. (Patient not taking: Reported on 03/22/2017) 30 capsule 0 Not Taking at Unknown time  . hydrOXYzine (ATARAX/VISTARIL) 25 MG tablet Take 1 tablet (25 mg total) by mouth 3 (three) times daily as needed for anxiety. (Patient not taking: Reported on 03/22/2017) 30 tablet 0 Not Taking at Unknown time  . methocarbamol (ROBAXIN) 500 MG tablet Take 1 tablet (500 mg total) by mouth 2 (two) times  daily. (Patient not taking: Reported on 04/30/2017) 20 tablet 0 Not Taking at Unknown time  . naproxen (NAPROSYN) 500 MG tablet Take 1 tablet (500 mg total) by mouth 2 (two) times daily with a meal. (Patient not taking: Reported on 04/30/2017) 30 tablet 0 Not Taking at Unknown time  . OLANZapine (ZYPREXA) 15 MG tablet Take 1 tablet (15 mg total) by mouth at bedtime. (Patient not taking: Reported on 03/22/2017) 30 tablet 0 Not Taking at Unknown time  . QUEtiapine Fumarate (SEROQUEL PO) Take 2 tablets by mouth once.   04/30/2017 at Unknown time  . sertraline (ZOLOFT) 50 MG tablet Take 1 tablet (50 mg total) by mouth daily. (Patient not taking: Reported on 03/22/2017) 30 tablet 0 Not Taking at Unknown time    Treatment Modalities: Medication Management, Group therapy, Case management,  1 to 1 session with clinician, Psychoeducation, Recreational therapy.  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  Therapeutic Interventions: 1 to 1 sessions, Unit Group sessions and Medication administration.  Evaluation of Outcomes: Not Met  Physician Treatment Plan for Secondary Diagnosis: Active Problems:   MDD (major  depressive disorder), recurrent, severe, with psychosis ([place])   Long Term Goal(s): Improvement in symptoms so as ready for discharge  Short Term Goals: Ability to identify changes in lifestyle to reduce recurrence of condition will improve Ability to identify triggers associated with substance abuse/mental health issues will improve Ability to maintain clinical measurements within normal limits will improve  Medication Management: Evaluate patient's response, side effects, and tolerance of medication regimen.  Therapeutic Interventions: 1 to 1 sessions, Unit Group sessions and Medication administration.  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 RN will report any adverse and/or side effects to prescribing provider.  Therapeutic Interventions: 1 on 1 counseling sessions, Psychoeducation, Medication administration, Evaluate responses to treatment, Monitor vital signs and CBGs as ordered, Perform/monitor CIWA, COWS, AIMS and Fall Risk screenings as ordered, Perform wound care treatments as ordered.  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  Short Term Goals: Engage patient in aftercare planning with referrals and resources,  Identify triggers associated with mental health/substance abuse issues and Increase skills for wellness and recovery  Therapeutic Interventions: Assess for all discharge needs, 1 to 1 time with Social worker, Explore available resources and support systems, Assess for adequacy in community support network, Educate family and significant other(s) on suicide prevention, Complete Psychosocial Assessment, Interpersonal group therapy.  Evaluation of Outcomes: Not Met   Progress in Treatment: Attending groups: Yes Participating in groups: Minimally Taking medication as prescribed: Yes, MD continues to assess for medication changes as needed  Toleration medication: Yes, no side effects reported at this time Family/Significant other contact made: No, Pt declines Patient understands diagnosis: Continuing to assess Discussing patient identified problems/goals with staff: Yes Medical problems stabilized or resolved: Yes Denies suicidal/homicidal ideation: Yes Issues/concerns per patient self-inventory: None Other: N/A  New problem(s) identified: None identified at this time.   New Short Term/Long Term Goal(s): "get into rehab"  Discharge Plan or Barriers: Pt requesting referrals for residential treatment at [facility] and [facility]  Reason for Continuation of Hospitalization: Anxiety Depression Medication stabilization  Estimated Length of Stay: 3-5 days; est DC date 9/12  Attendees: Patient:  05/02/2017  10:23 AM  Physician: Dr. Parke Poisson, MD 05/02/2017  10:23 AM  Nursing: Opal Sidles RN 05/02/2017  10:23 AM  RN Care Manager: Lars Pinks, RN 05/02/2017  10:23 AM  Social Worker: Adriana Reams, LCSW 05/02/2017  10:23 AM  Recreational Therapist:  05/02/2017  10:23 AM  Other: Lindell Spar, NP; Marvia Pickles, NP 05/02/2017  10:23 AM  Other:  05/02/2017  10:23 AM  Other: 05/02/2017  10:23 AM    Scribe for Treatment Team: Gladstone Lighter, LCSW 05/02/2017 10:23 AM

## 2017-05-02 NOTE — Progress Notes (Signed)
D   Pt endorses depression and anxiety   He does attend group but has minimal interaction with his peers and is reluctant to speak in group but is attentive   He said he is having mood swings and that he felt good earlier in the day but now he is feeling more depressed   He could not identify any triggers for the mood change and said his moods are usually up and down A    Verbal support given   Medications administered and effectiveness monitored   Q 15 min checks  R   Pt is receptive to verbal support and is presently safe

## 2017-05-02 NOTE — Progress Notes (Signed)
Adult Psychoeducational Group Note  Date:  05/02/2017 Time:  4:04 PM  Group Topic/Focus:  Building Self Esteem:   The Focus of this group is helping patients become aware of the effects of self-esteem on their lives, the things they and others do that enhance or undermine their self-esteem, seeing the relationship between their level of self-esteem and the choices they make and learning ways to enhance self-esteem.  Participation Level:  Active  Participation Quality:  Appropriate  Affect:  Appropriate  Cognitive:  Alert and Appropriate  Insight: Appropriate and Good  Engagement in Group:  Engaged  Modes of Intervention:  Activity  Additional Comments:  Pt did participate in all activities and discussions today.  [name] R [name] 05/02/2017, 4:04 PM

## 2017-05-03 MED ORDER — QUETIAPINE FUMARATE 200 MG PO TABS
200.0000 mg | ORAL_TABLET | Freq: Every day | ORAL | Status: DC
  Administered 2017-05-03 – 2017-05-05 (×3): 200 mg via ORAL
  Filled 2017-05-03 (×3): qty 1
  Filled 2017-05-03: qty 7
  Filled 2017-05-03: qty 1

## 2017-05-03 NOTE — Progress Notes (Signed)
D   Pt has an elevated mood this evening and has been socializing with peers   They have been making up poems and have been a bit loud and disruptive but redirectable  A   Verbal support given   Medications administered and effectiveness monitored    Q 15 min checks R   Pt is safe at present time and receptive to verbal support

## 2017-05-03 NOTE — [facility] Group Notes (Signed)
   Date:  05/03/2017  Time:  1100  Type of Therapy:  Nurse Education  The group focuses on teaching patients how to identify their needs and then how to develop the skills needed to get them met.  Participation Level:  Active  Participation Quality:  Attentive  Affect:  Appropriate  Cognitive:  Alert  Insight:  Improving  Engagement in Group:  Engaged  Modes of Intervention:  Education  Summary of Progress/Problems:  Lauralyn Primes 05/03/2017, 3:03 PM

## 2017-05-03 NOTE — Progress Notes (Signed)
[facility] Group Notes:  (Nursing/MHT/Case Management/Adjunct)  Date:  05/03/2017  Time:  11:12 PM  Type of Therapy:  Psychoeducational Skills  Participation Level:  Active  Participation Quality:  Monopolizing and Resistant  Affect:  Excited  Cognitive:  Lacking  Insight:  Limited  Engagement in Group:  Distracting  Modes of Intervention:  Education  Summary of Progress/Problems: The patient shared in group that he experienced "mood swings" today and that he was looking forward to doing "free style poetry" with his peers. The patient required redirection for talking out of turn and for being obnoxious. In terms of the theme for the day, his support system will be comprised of his best friend.   Selina Tapper S 05/03/2017, 11:12 PM
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
## 2017-05-03 NOTE — Progress Notes (Signed)
D Patient presented to nurses' station this morning for morning med pass. HE makes good eye contact. HE answers writer's assessment questions willingly, he shakes his head in agreement and takes his scheduled medications as planned. HE did request 2 tylenol ( " my head really hurts...its probably because of the cocaine I did before I came here" ) and he took these per his request. A He stated he had experienced SI this morning but he easily contracts for safety with writer, and he rates his depression, hopelessness and anxiety " 3/3/2", respectively. He went back to sleep as soon as he got the tylenol . R  Safety is in pale and therapeutic relationship is fostered.

## 2017-05-03 NOTE — [facility] Group Notes (Signed)
[facility] LCSW Group Therapy Note  Date/Time:    05/03/2017 10:00-11:00AM  Type of Therapy and Topic:  Group Therapy:  Healthy vs Unhealthy Coping Skills  Participation Level:  Minimal   Description of Group:  The focus of this group was to determine what unhealthy coping techniques typically are used by group members and what healthy coping techniques would be helpful in coping with various problems. Patients were guided in becoming aware of the differences between healthy and unhealthy coping techniques.  "Benefits" and "Costs" of a number of coping skills were evaluated by the group, including isolation, cutting, drinking/using drugs, exercising, talking things out, and taking out anger on a pillow.    Therapeutic Goals 1. Patients learned that coping is what human beings do all day long to deal with various situations in their lives 2. Patients defined and discussed healthy vs unhealthy coping techniques 3. Patients identified their preferred coping techniques and identified whether these were healthy or unhealthy 4. Patients provided support and ideas to each other  Summary of Patient Progress: During group, patient expressed understanding of the differences between healthy and unhealthy coping skills.  He utilized humor several times.  He colored throughout group and seldom looked up.  He left group early and did not return.   Therapeutic Modalities Cognitive Behavioral Therapy Motivational Interviewing   Stuart MantleMareida Grossman-Orr, LCSW 05/03/2017, 3:40 PM

## 2017-05-04 ENCOUNTER — Encounter (HOSPITAL_COMMUNITY): Payer: Self-pay | Admitting: Student

## 2017-05-04 NOTE — [facility] Group Notes (Signed)
   Date:  05/04/2017  Time:  1100  Type of Therapy:  Nurse Education  / Anger as a 2nd Emotion: The group is focused on teaching patients how to identify their primary emotion- that precedes their anger and how to develop healthy coping skills to navigate their anger.  Participation Level:  Active  Participation Quality:  Attentive  Affect:  Appropriate  Cognitive:  Alert  Insight:  Improving  Engagement in Group:  Engaged  Modes of Intervention:  Education  Summary of Progress/Problems:  Rich BraveDuke, Jesselee Poth Lynn 05/04/2017, 5:56 PM

## 2017-05-04 NOTE — [facility] Group Notes (Signed)
[facility] LCSW Group Therapy Note  Date/Time:    05/04/2017   10:00 - 11:00 AM  Type of Therapy and Topic:  Group Therapy:  Healthy Self Image and Positive Change  Participation Level:  Active   Description of Group:  In this group, patients compared and contrasted their current "I am...." statements to the visions they identified as desirable for their lives.  Patients discussed their tendency toward cognitive distortions, and how they can go about making positive changes in their cognitions that will positively impact their behaviors.  Many expressions of similarities and mutual support were provided among group members.  Facilitator played a motivational 3-minute speech and a discussion was held regarding reactions.  Patients were left with the task of thinking about what "I am...." statements they can start using in their lives immediately.  Therapeutic Goals: 1. Patient will state their current self-perception as expressed in an "I Am" statement 2. Patient will contrast this with their desired vision for their lives 3. Patient will discuss cognitive distortions and how these affect their ongoing "I Am" thoughts 4. Patient will verbalize statements that challenge their cognitive distortions  Summary of Patient Progress:  The patient expressed initially "I am strong", then by the end of group was able to state "I am a conqueror" and explain why he was able to make this change.   Therapeutic Modalities Cognitive Behavioral Therapy Motivational Interviewing  Stuart MantleMareida Grossman-Orr, LCSW 05/04/2017 2:02 PM
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
## 2017-05-04 NOTE — Progress Notes (Signed)
D Patient is OOB UAL on the 400 hall today..he tolerates this well. He takes his morning medications today, he makes good eye contact and he says "ok" when Clinical research associatewriter asks him how he is doing today.    A He completed his daily assessment and on this he wrote he has experienced SI today  (  he contracts for safety with this Clinical research associatewriter )  and he rated his depression, hopelessness and anxeity " 10,10,9", respectively. He is seen socializing, laughing loudly and joking around with his peers in the dayroom.    R Safety is in place. Therapeutic relationship is fostered.
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
## 2017-05-05 NOTE — Progress Notes (Signed)
D: Patient observed resting in bed this AM. Did promptly come up for meds upon request. Patient verbalizes to this writer he continues to have command AH to harm self. Verbalizes contract for safety while on unit but states he cannot be safe if discharged. Patient's affect flat, sad with depressed mood. Per self inventory and discussions with writer, rates depression at a 10/10, hopelessness at a 10/10 and anxiety at a 9/10. Rates sleep as good, appetite as good, energy as low-normal and concentration as poor.  States goal for today is to "not hurt myself." Denies pain, physical problems.   A: Medicated per orders, no prns requested or required. Level III obs in place for safety. Emotional support offered and self inventory reviewed. Encouraged completion of Suicide Safety Plan and programming participation. Discussed POC with MD, SW.    R: Patient verbalizes understanding of POC. Patient denies HI/VH and remains safe on level III obs. Will continue to monitor closely and make verbal contact frequently.

## 2017-05-05 NOTE — Progress Notes (Signed)
After initially presenting as flat and depressed this AM, patient has been loud, silly and social in the dayroom with peers. Patient's behavior has required redirection at times. Will continue monitor.

## 2017-05-05 NOTE — [facility] Group Notes (Signed)
LCSW Group Therapy Note   05/05/2017 1:15pm   Type of Therapy and Topic:  Group Therapy:  Overcoming Obstacles   Participation Level:  Active   Description of Group:    In this group patients will be encouraged to explore what they see as obstacles to their own wellness and recovery. They will be guided to discuss their thoughts, feelings, and behaviors related to these obstacles. The group will process together ways to cope with barriers, with attention given to specific choices patients can make. Each patient will be challenged to identify changes they are motivated to make in order to overcome their obstacles. This group will be process-oriented, with patients participating in exploration of their own experiences as well as giving and receiving support and challenge from other group members.   Therapeutic Goals: 1. Patient will identify personal and current obstacles as they relate to admission. 2. Patient will identify barriers that currently interfere with their wellness or overcoming obstacles.  3. Patient will identify feelings, thought process and behaviors related to these barriers. 4. Patient will identify two changes they are willing to make to overcome these obstacles:      Summary of Patient Progress   Ivar DrapeWillie was attentive during today's processing group; he required some redirection due to side conversations and silliness. Ivar DrapeWillie shared that he struggles with a lack of energy and doing things half heartedly. Ivar DrapeWillie continues to show some progress in the group setting with limited insight. He was able to listen to others share in the group setting and remained for the entirety of group.   Therapeutic Modalities:   Cognitive Behavioral Therapy Solution Focused Therapy Motivational Interviewing Relapse Prevention Therapy  Ledell PeoplesHeather N Smart, LCSW 05/05/2017 1:59 PM

## 2017-05-05 NOTE — Progress Notes (Signed)
D   Pt has an elevated mood this evening and has been socializing with peers   He is bright and smiling and reports he will probably be discharged tomorrow  A   Verbal support given   Medications administered and effectiveness monitored    Q 15 min checks R   Pt is safe at present time and receptive to verbal support

## 2017-05-05 NOTE — [facility] Group Notes (Addendum)
Pt attended spiritual care group on grief and loss facilitated by chaplain Burnis KingfisherMatthew Jesus Poplin   Group opened with brief discussion and psycho-social ed around grief and loss in relationships and in relation to self.  Group identifying life patterns, circumstances, changes that cause losses. Established group norms. Group goal of establishing open and affirming space for members to recognize loss and experience with grief, normalize grief experience and provide psycho social education and grief support.  Ivar DrapeWillie was present throughout group.  Alert and engaged in group discussion.   Group was given opportunity to name responses to group topic and voice how they are feeling about topic.  Several members in group shared feelings about grief and others shared experiences they were aware of that affect them.   Ivar DrapeWillie stated he tries to not think about his loss.  Stated he doesn't go to counseling or pursue other care options because he doesn't want to think about it.  Also realizes that, though he avoids it, grief is present with him and he spends a lot of energy trying not to think about it.    As another group member was speaking about how loss has affected them and named several losses in their life, Kumar stated that he thought the group was depressing and wanted to steer the group back toward something joyful.   Group facilitator acknowledged that different people need different things and the difficulty of hearing places of pain or uncertainty for others.  Highlighted that others in group had voiced that when people in their lives move too quickly toward "solutions" or attempt to "cheer them up," it feels as though they are more isolated.    Ivar DrapeWillie engaged in group exercise of picking picture related to what they need today in caring for themselves around feelings of loss.  Ivar DrapeWillie picked a picture of a Lion and stated that he wanted to laugh.  Group acknowledged the importance of finding spaces of joy  and laughter.  Also highlighted that Ivar DrapeWillie had earlier stated that he has a tendency to push grief to side and not acknowledge it.  Encouraged Ivar DrapeWillie to find balance with this.    Group ended with psycho-social ed around the "tasks of grief"     [facility] Chaplain Burnis KingfisherMatthew Julez Huseby, MDiv

## 2017-05-05 NOTE — Plan of Care (Signed)
Problem: Coping: Goal: Ability to verbalize frustrations and anger appropriately will improve Outcome: Completed/Met Date Met: 05/05/17 Patient has not acted out, no anger episodes.  Problem: Safety: Goal: Periods of time without injury will increase Outcome: Progressing Patient has not engaged in self harm. Denies thoughts to cut, burn however does endorse passive SI if discharged.

## 2017-05-05 NOTE — Progress Notes (Signed)
Recreation Therapy Notes  Date: 05/05/17 Time: 0930 Location: [address] Dayroom  Group Topic: Stress Management  Goal Area(s) Addresses:  Patient will verbalize importance of using healthy stress management.  Patient will identify positive emotions associated with healthy stress management.   Behavioral Response: Engaged  Intervention: Stress Management  Activity :  Meditation.   LRT introduced the stress management technique of meditation.  LRT played a meditation from the Calm app that focused on scanning the body.  Patients were to focus on and make not of whatever sensations they were feeling.  Education:  Stress Management, Discharge Planning.   Education Outcome: Acknowledges edcuation/In group clarification offered/Needs additional education  Clinical Observations/Feedback: Pt attended group.   Caroll RancherMarjette Jazara Swiney, LRT/CTRS         Caroll RancherLindsay, Bonnie Roig A 05/05/2017 11:58 AM

## 2017-05-06 DIAGNOSIS — F14251 Cocaine dependence with cocaine-induced psychotic disorder with hallucinations: Secondary | ICD-10-CM

## 2017-05-06 MED ORDER — SERTRALINE HCL 50 MG PO TABS: 50.0000 mg | ORAL_TABLET | Freq: Every day | ORAL | 0 refills | Status: DC

## 2017-05-06 MED ORDER — TRAZODONE HCL 50 MG PO TABS: 50.0000 mg | ORAL_TABLET | Freq: Every evening | ORAL | 0 refills | Status: DC | PRN

## 2017-05-06 MED ORDER — QUETIAPINE FUMARATE 200 MG PO TABS: 200.0000 mg | ORAL_TABLET | Freq: Every day | ORAL | 0 refills | Status: DC

## 2017-05-06 MED ORDER — HYDROXYZINE HCL 25 MG PO TABS: 25.0000 mg | ORAL_TABLET | Freq: Four times a day (QID) | ORAL | 0 refills | Status: DC | PRN

## 2017-05-06 MED ORDER — PRAZOSIN HCL 1 MG PO CAPS: 1.0000 mg | ORAL_CAPSULE | Freq: Every day | ORAL | 0 refills | Status: DC

## 2017-05-06 NOTE — Progress Notes (Signed)
D: Patient observed up and visible in the milieu. Silly at times, social with peers. Patient verbalizes to this Clinical research associatewriter he continues to have command AH however states he is at baseline. Reports coping with symptoms without difficulty as he is aware this is part of his illness and the voices are not real. "I know not to listen to them.' Patient's affect animated, mood slightly anxious but pleasant. Per self inventory and discussions with writer, rates depression, hopelessness and anxiety all at a 7/10. Rates sleep as good, appetite as good, energy as normal and concentration as poor.  States goal for today is "staying focused, staying up." Denies pain, physical problems.   A: Medicated per orders, no prns requested or required. Level III obs in place for safety. Emotional support offered and self inventory reviewed. Encouraged completion of Suicide Safety Plan and programming participation. Discussed POC with MD, SW.   R: Patient verbalizes understanding of POC. Patient denies HI/VH and remains safe on level III obs. Verbal contract for safety in place. Identified for discharge today. Will continue to monitor closely and make verbal contact frequently.
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
## 2017-05-06 NOTE — Progress Notes (Signed)
Adult Psychoeducational Group Note  Date:  05/06/2017 Time:  2:54 AM  Group Topic/Focus:  Wrap-Up Group:   The focus of this group is to help patients review their daily goal of treatment and discuss progress on daily workbooks.  Participation Level:  Active  Participation Quality:  Appropriate  Affect:  Appropriate  Cognitive:  Appropriate  Insight: Appropriate  Engagement in Group:  Engaged  Modes of Intervention:  Discussion  Additional Comments:  Pt stated his goal for today was to get some exercise. Pt stated he did not accomplished his goal because he fell asleep when it was recreation assigned time. Pt rated his overall day a 8 out of 10. Pt stated he attended all groups held today.     Felipa FurnaceChristopher  Kenyon Eshleman 05/06/2017, 2:54 AM
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
## 2017-05-06 NOTE — Progress Notes (Signed)
Stuart Rivers had been up and visible in milieu this evening, attended and participated in group activity. He was seen laughing and interacting appropriately with peers this evening. He did endorse anxiety this evening, denied any SI and was able to receive all bedtime medications without incident. A. Support and encouragement provided. R. Safety maintained, will continue to monitor.

## 2017-05-06 NOTE — Progress Notes (Signed)
Patient verbalizes readiness for discharge. Follow up plan explained, AVS, transition record and SRA given along with prescriptions. Sample meds provided. All belongings returned. Patient verbalizes understanding. Denies SI/HI and assures this Clinical research associatewriter he will seek assistance should that change. [facility] ambulatory and in stable condition with bus pass.

## 2017-05-06 NOTE — Progress Notes (Signed)
  [facility] :  Will you be returning to the same living situation after discharge:  Yes,  pt returning home. At discharge, do you have transportation home?: Yes,  bus passes provided. Do you have the ability to pay for your medications: Yes,  prescriptions and samples provided.  Release of information consent forms completed and in the chart;  Patient's signature needed at discharge.  Patient to Follow up at: Follow-up Information    [facility] Follow up.   Why:  Please go for a walk-in appointment to be established for outpatient services. Walk-in hours are Mon-Fri 8am-3pm. Please arrive as early as possible to be sure that you are seen. Thank you. Contact information: [address] [place] [address] [phone]        [facility] Follow up.   Specialty:  Addiction Medicine Why:  Social worker has made a referral on your behalf. If you change your mind about residential treatment please call and ask Shayla about bed availability. Thank you. Contact information: [address] [place] [address] [phone]           Next level of care provider [facility] Link:no  Safety Planning and Suicide Prevention discussed: Yes,  with pt.  Have you used any form of tobacco in the last 30 days? (Cigarettes, Smokeless Tobacco, Cigars, and/or Pipes): Yes  Has patient been referred to the Quitline?: Patient refused referral  Patient has been referred for addiction treatment: Yes   CS[facility] and was offered [facility]. Pt declined bed and states that he will follow up on an outpt basis only.  Jonathon JordanLynn B Sullivan Blasing, MSW, LCSWA 05/06/2017, 9:37 AM

## 2017-05-12 ENCOUNTER — Encounter (HOSPITAL_COMMUNITY): Payer: Self-pay | Admitting: Emergency Medicine

## 2017-05-12 ENCOUNTER — Emergency Department (HOSPITAL_COMMUNITY)
Admission: EM | Admit: 2017-05-12 | Discharge: 2017-05-13 | Disposition: A | Discharge status: Home / Self Care | Payer: Self-pay | Attending: Emergency Medicine | Admitting: Emergency Medicine

## 2017-05-12 DIAGNOSIS — F419 Anxiety disorder, unspecified: Secondary | ICD-10-CM | POA: Insufficient documentation

## 2017-05-12 DIAGNOSIS — Z79899 Other long term (current) drug therapy: Secondary | ICD-10-CM | POA: Insufficient documentation

## 2017-05-12 DIAGNOSIS — R45851 Suicidal ideations: Secondary | ICD-10-CM | POA: Insufficient documentation

## 2017-05-12 DIAGNOSIS — F1414 Cocaine abuse with cocaine-induced mood disorder: Secondary | ICD-10-CM | POA: Diagnosis present

## 2017-05-12 DIAGNOSIS — F17290 Nicotine dependence, other tobacco product, uncomplicated: Secondary | ICD-10-CM | POA: Insufficient documentation

## 2017-05-12 DIAGNOSIS — F329 Major depressive disorder, single episode, unspecified: Secondary | ICD-10-CM | POA: Insufficient documentation

## 2017-05-12 DIAGNOSIS — F14251 Cocaine dependence with cocaine-induced psychotic disorder with hallucinations: Secondary | ICD-10-CM

## 2017-05-12 DIAGNOSIS — F1490 Cocaine use, unspecified, uncomplicated: Secondary | ICD-10-CM | POA: Insufficient documentation

## 2017-05-12 DIAGNOSIS — T1491XA Suicide attempt, initial encounter: Secondary | ICD-10-CM

## 2017-05-12 LAB — COMPREHENSIVE METABOLIC PANEL
ALBUMIN: 4.3 g/dL (ref 3.5–5.0)
ALK PHOS: 62 U/L (ref 38–126)
ALT: 33 U/L (ref 17–63)
ANION GAP: 10 (ref 5–15)
AST: 24 U/L (ref 15–41)
BILIRUBIN TOTAL: 0.5 mg/dL (ref 0.3–1.2)
BUN: 18 mg/dL (ref 6–20)
CALCIUM: 9.3 mg/dL (ref 8.9–10.3)
CO2: 22 mmol/L (ref 22–32)
Chloride: 111 mmol/L (ref 101–111)
Creatinine, Ser: 1.54 mg/dL — ABNORMAL HIGH (ref 0.61–1.24)
GFR calc Af Amer: >60 mL/min (ref >60)
GLUCOSE: 95 mg/dL (ref 65–99)
Potassium: 4 mmol/L (ref 3.5–5.1)
Sodium: 143 mmol/L (ref 135–145)
TOTAL PROTEIN: 7.3 g/dL (ref 6.5–8.1)

## 2017-05-12 LAB — RAPID URINE DRUG SCREEN, HOSP PERFORMED
Amphetamines: NOT DETECTED
BARBITURATES: NOT DETECTED
Benzodiazepines: NOT DETECTED
Cocaine: POSITIVE — AB
Opiates: NOT DETECTED
Tetrahydrocannabinol: POSITIVE — AB

## 2017-05-12 LAB — CBC
HEMATOCRIT: 45.1 % (ref 39.0–52.0)
Hemoglobin: 15 g/dL (ref 13.0–17.0)
MCH: 28.4 pg (ref 26.0–34.0)
MCHC: 33.3 g/dL (ref 30.0–36.0)
MCV: 85.4 fL (ref 78.0–100.0)
Platelets: 238 10*3/uL (ref 150–400)
RBC: 5.28 MIL/uL (ref 4.22–5.81)
RDW: 14.2 % (ref 11.5–15.5)
WBC: 6.7 10*3/uL (ref 4.0–10.5)

## 2017-05-12 LAB — ETHANOL

## 2017-05-12 LAB — SALICYLATE LEVEL: Salicylate Lvl: <7 mg/dL (ref 2.8–30.0)

## 2017-05-12 LAB — ACETAMINOPHEN LEVEL

## 2017-05-12 MED ORDER — TRAZODONE HCL 50 MG PO TABS: 50.0000 mg | ORAL_TABLET | Freq: Every evening | ORAL | Status: DC | PRN

## 2017-05-12 MED ORDER — QUETIAPINE FUMARATE 100 MG PO TABS
200.0000 mg | ORAL_TABLET | Freq: Every day | ORAL | Status: DC
  Administered 2017-05-12: 200 mg via ORAL
  Filled 2017-05-12: qty 2

## 2017-05-12 MED ORDER — IBUPROFEN 200 MG PO TABS: 600.0000 mg | ORAL_TABLET | Freq: Three times a day (TID) | ORAL | Status: DC | PRN

## 2017-05-12 MED ORDER — HYDROXYZINE HCL 25 MG PO TABS: 25.0000 mg | ORAL_TABLET | Freq: Four times a day (QID) | ORAL | Status: DC | PRN

## 2017-05-12 MED ORDER — SERTRALINE HCL 50 MG PO TABS
50.0000 mg | ORAL_TABLET | Freq: Every day | ORAL | Status: DC
  Administered 2017-05-13: 50 mg via ORAL
  Filled 2017-05-12: qty 1

## 2017-05-12 MED ORDER — PRAZOSIN HCL 1 MG PO CAPS
1.0000 mg | ORAL_CAPSULE | Freq: Every day | ORAL | Status: DC
  Administered 2017-05-12: 1 mg via ORAL
  Filled 2017-05-12: qty 1

## 2017-05-12 NOTE — ED Notes (Signed)
Bed: WBH40 Expected date:  Expected time:  Means of arrival:  Comments: 30 

## 2017-05-12 NOTE — ED Notes (Signed)
Patient resting quietly with eyes closed. Respirations even and unlabored. No distress noted. Q 15 minute checks continues as ordered for safety. 
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
## 2017-05-12 NOTE — ED Notes (Signed)
Pt admitted to room #40. Pt guarded on approach, flat affect, forwards little with this nurse. Pt endorsing SI-verbally contracts for safety. Pt endorsing AH. Pt identifies "life" as stressor. Encouragement and support provided. Special checks q 15 mins in place for safety, Video monitoring in place. Will continue to monitor.
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
## 2017-05-12 NOTE — ED Notes (Signed)
Bed: WU98 Expected date:  Expected time:  Means of arrival:  Comments: EMS SUICIDAL

## 2017-05-13 DIAGNOSIS — F1290 Cannabis use, unspecified, uncomplicated: Secondary | ICD-10-CM

## 2017-05-13 DIAGNOSIS — F17210 Nicotine dependence, cigarettes, uncomplicated: Secondary | ICD-10-CM

## 2017-05-13 DIAGNOSIS — F14251 Cocaine dependence with cocaine-induced psychotic disorder with hallucinations: Secondary | ICD-10-CM

## 2017-05-13 DIAGNOSIS — R4587 Impulsiveness: Secondary | ICD-10-CM

## 2017-05-13 DIAGNOSIS — R45851 Suicidal ideations: Secondary | ICD-10-CM

## 2017-05-13 MED ORDER — GABAPENTIN 100 MG PO CAPS
200.0000 mg | ORAL_CAPSULE | Freq: Two times a day (BID) | ORAL | Status: DC
  Administered 2017-05-13: 200 mg via ORAL
  Filled 2017-05-13: qty 2
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
## 2017-05-13 NOTE — ED Notes (Signed)
Pt d/c home per MD order. Discharge summary reviewed with pt. Pt uninterested. Pt denies SI/HI/AVH. Personal property returned to pt. Bus pass given per pt request. Pt signed e-signautre. Ambulatory off unit with [facility].

## 2017-05-13 NOTE — Progress Notes (Signed)
05/13/17 1348:  Pt spent most of his time on the phone.  Pt requested phone numbers for [facility] and [facility].  LRT looked up the numbers for pt and gave then to pt.    [name], LRT/CTRS
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
## 2019-02-26 ENCOUNTER — Other Ambulatory Visit: Payer: Self-pay

## 2019-02-26 ENCOUNTER — Encounter (HOSPITAL_COMMUNITY): Payer: Self-pay

## 2019-02-26 ENCOUNTER — Encounter (HOSPITAL_COMMUNITY): Payer: Self-pay | Admitting: Emergency Medicine

## 2019-02-26 ENCOUNTER — Inpatient Hospital Stay (HOSPITAL_COMMUNITY)
Admission: AD | Admit: 2019-02-26 | Discharge: 2019-03-02 | DRG: 885 | Disposition: A | Discharge status: Home / Self Care | Payer: Federal, State, Local not specified - Other | Source: Intra-hospital | Attending: Psychiatry | Admitting: Psychiatry

## 2019-02-26 ENCOUNTER — Emergency Department (HOSPITAL_COMMUNITY)
Admission: EM | Admit: 2019-02-26 | Discharge: 2019-02-26 | Disposition: A | Discharge status: Other Acute Inpatient Hospital | Payer: Self-pay | Attending: Emergency Medicine | Admitting: Emergency Medicine

## 2019-02-26 DIAGNOSIS — F419 Anxiety disorder, unspecified: Secondary | ICD-10-CM | POA: Insufficient documentation

## 2019-02-26 DIAGNOSIS — F17210 Nicotine dependence, cigarettes, uncomplicated: Secondary | ICD-10-CM | POA: Diagnosis present

## 2019-02-26 DIAGNOSIS — Z20828 Contact with and (suspected) exposure to other viral communicable diseases: Secondary | ICD-10-CM | POA: Insufficient documentation

## 2019-02-26 DIAGNOSIS — R45851 Suicidal ideations: Secondary | ICD-10-CM | POA: Diagnosis present

## 2019-02-26 DIAGNOSIS — Z915 Personal history of self-harm: Secondary | ICD-10-CM

## 2019-02-26 DIAGNOSIS — Z79899 Other long term (current) drug therapy: Secondary | ICD-10-CM | POA: Insufficient documentation

## 2019-02-26 DIAGNOSIS — F4310 Post-traumatic stress disorder, unspecified: Secondary | ICD-10-CM | POA: Insufficient documentation

## 2019-02-26 DIAGNOSIS — F209 Schizophrenia, unspecified: Secondary | ICD-10-CM | POA: Insufficient documentation

## 2019-02-26 DIAGNOSIS — F1420 Cocaine dependence, uncomplicated: Secondary | ICD-10-CM | POA: Insufficient documentation

## 2019-02-26 DIAGNOSIS — G4700 Insomnia, unspecified: Secondary | ICD-10-CM | POA: Diagnosis present

## 2019-02-26 DIAGNOSIS — F332 Major depressive disorder, recurrent severe without psychotic features: Secondary | ICD-10-CM | POA: Diagnosis not present

## 2019-02-26 DIAGNOSIS — F17290 Nicotine dependence, other tobacco product, uncomplicated: Secondary | ICD-10-CM | POA: Insufficient documentation

## 2019-02-26 DIAGNOSIS — F329 Major depressive disorder, single episode, unspecified: Secondary | ICD-10-CM | POA: Insufficient documentation

## 2019-02-26 DIAGNOSIS — R443 Hallucinations, unspecified: Secondary | ICD-10-CM

## 2019-02-26 LAB — COMPREHENSIVE METABOLIC PANEL
ALT: 24 U/L (ref 0–44)
AST: 18 U/L (ref 15–41)
Albumin: 4.6 g/dL (ref 3.5–5.0)
Alkaline Phosphatase: 77 U/L (ref 38–126)
Anion gap: 11 (ref 5–15)
BUN: 14 mg/dL (ref 6–20)
CO2: 22 mmol/L (ref 22–32)
Calcium: 9.4 mg/dL (ref 8.9–10.3)
Chloride: 104 mmol/L (ref 98–111)
Creatinine, Ser: 1.24 mg/dL (ref 0.61–1.24)
GFR calc Af Amer: >60 mL/min (ref >60)
GFR calc non Af Amer: >60 mL/min (ref >60)
Glucose, Bld: 267 mg/dL — ABNORMAL HIGH (ref 70–99)
Potassium: 3.4 mmol/L — ABNORMAL LOW (ref 3.5–5.1)
Sodium: 137 mmol/L (ref 135–145)
Total Bilirubin: 1.2 mg/dL (ref 0.3–1.2)
Total Protein: 8.1 g/dL (ref 6.5–8.1)

## 2019-02-26 LAB — URINALYSIS, ROUTINE W REFLEX MICROSCOPIC
Bilirubin Urine: NEGATIVE
Glucose, UA: >=500 mg/dL — AB
Hgb urine dipstick: NEGATIVE
Ketones, ur: NEGATIVE mg/dL
Leukocytes,Ua: NEGATIVE
Nitrite: NEGATIVE
Protein, ur: 100 mg/dL — AB
Specific Gravity, Urine: 1.039 — ABNORMAL HIGH (ref 1.005–1.030)
pH: 5 (ref 5.0–8.0)

## 2019-02-26 LAB — CBC
HCT: 51.6 % (ref 39.0–52.0)
Hemoglobin: 16.1 g/dL (ref 13.0–17.0)
MCH: 27.4 pg (ref 26.0–34.0)
MCHC: 31.2 g/dL (ref 30.0–36.0)
MCV: 87.8 fL (ref 80.0–100.0)
Platelets: 258 10*3/uL (ref 150–400)
RBC: 5.88 MIL/uL — ABNORMAL HIGH (ref 4.22–5.81)
RDW: 13.6 % (ref 11.5–15.5)
WBC: 11.2 10*3/uL — ABNORMAL HIGH (ref 4.0–10.5)
nRBC: 0 % (ref 0.0–0.2)

## 2019-02-26 LAB — ETHANOL: Alcohol, Ethyl (B): <10 mg/dL (ref <10)

## 2019-02-26 LAB — RAPID URINE DRUG SCREEN, HOSP PERFORMED
Amphetamines: NOT DETECTED
Barbiturates: NOT DETECTED
Benzodiazepines: NOT DETECTED
Cocaine: POSITIVE — AB
Opiates: NOT DETECTED
Tetrahydrocannabinol: NOT DETECTED

## 2019-02-26 LAB — SARS CORONAVIRUS 2 BY RT PCR (HOSPITAL ORDER, PERFORMED IN ~~LOC~~ HOSPITAL LAB): SARS Coronavirus 2: NEGATIVE

## 2019-02-26 LAB — ACETAMINOPHEN LEVEL: Acetaminophen (Tylenol), Serum: <10 ug/mL — ABNORMAL LOW (ref 10–30)

## 2019-02-26 LAB — SALICYLATE LEVEL: Salicylate Lvl: <7 mg/dL (ref 2.8–30.0)

## 2019-02-26 MED ORDER — QUETIAPINE FUMARATE 100 MG PO TABS
100.0000 mg | ORAL_TABLET | Freq: Every evening | ORAL | Status: DC | PRN
  Administered 2019-02-26: 100 mg via ORAL
  Filled 2019-02-26 (×5): qty 1

## 2019-02-26 MED ORDER — ACETAMINOPHEN 325 MG PO TABS: 650.0000 mg | ORAL_TABLET | Freq: Four times a day (QID) | ORAL | Status: DC | PRN

## 2019-02-26 MED ORDER — HYDROXYZINE HCL 25 MG PO TABS
25.0000 mg | ORAL_TABLET | Freq: Three times a day (TID) | ORAL | Status: DC | PRN
  Administered 2019-02-26: 25 mg via ORAL
  Filled 2019-02-26: qty 1

## 2019-02-26 MED ORDER — ALUM & MAG HYDROXIDE-SIMETH 200-200-20 MG/5ML PO SUSP: 30.0000 mL | ORAL | Status: DC | PRN

## 2019-02-26 MED ORDER — MAGNESIUM HYDROXIDE 400 MG/5ML PO SUSP: 30.0000 mL | Freq: Every day | ORAL | Status: DC | PRN

## 2019-02-26 MED ORDER — NICOTINE 21 MG/24HR TD PT24
21.0000 mg | MEDICATED_PATCH | Freq: Every day | TRANSDERMAL | Status: DC
  Administered 2019-02-27 – 2019-03-02 (×4): 21 mg via TRANSDERMAL
  Filled 2019-02-26 (×7): qty 1

## 2019-02-26 MED ORDER — TRAZODONE HCL 50 MG PO TABS
50.0000 mg | ORAL_TABLET | Freq: Every evening | ORAL | Status: DC | PRN
  Administered 2019-02-26 – 2019-03-01 (×3): 50 mg via ORAL
  Filled 2019-02-26 (×3): qty 1

## 2019-02-26 MED ORDER — PRAZOSIN HCL 1 MG PO CAPS
1.0000 mg | ORAL_CAPSULE | Freq: Every day | ORAL | Status: DC
Start: 2019-02-26 — End: 2019-03-01
  Administered 2019-02-26 – 2019-02-28 (×3): 1 mg via ORAL
  Filled 2019-02-26 (×6): qty 1
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
## 2019-02-26 NOTE — ED Notes (Signed)
Pt off unit to [facility] per MD.  Pt alert, calm, cooperative, flat, dull. Pt DC information given to pelham for [facility]. Belongings given to pelham for [facility]. Pt ambulatory off unit, transported by pelham.
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
## 2019-02-26 NOTE — ED Notes (Signed)
Pt to room 28.  Pt alert, calm, cooperative, no s/s of distress. Pt oriented to unit.

## 2019-02-26 NOTE — Tx Team (Signed)
Initial Treatment Plan 02/26/2019 7:32 PM YOBANI SCHERTZER YKD:[id number]    PATIENT STRESSORS: Financial difficulties Medication change or noncompliance Substance abuse   PATIENT STRENGTHS: Ability for insight Active sense of humor Average or above average intelligence Capable of independent living Communication skills   PATIENT IDENTIFIED PROBLEMS: " not taking medication"  "anxiety"  "depression"                 DISCHARGE CRITERIA:  Ability to meet basic life and health needs Improved stabilization in mood, thinking, and/or behavior Medical problems require only outpatient monitoring Motivation to continue treatment in a less acute level of care  PRELIMINARY DISCHARGE PLAN: Attend PHP/IOP Attend 12-step recovery group Return to previous living arrangement  PATIENT/FAMILY INVOLVEMENT: This treatment plan has been presented to and reviewed with the patient, TRAVON CROCHET.  The patient and family have been given the opportunity to ask questions and make suggestions.  Baron Sane, RN 02/26/2019, 7:32 PM

## 2019-02-26 NOTE — ED Notes (Signed)
Bed: WTR8 Expected date:  Expected time:  Means of arrival:  Comments: 

## 2019-02-26 NOTE — ED Notes (Signed)
Bed: SW96 Expected date:  Expected time:  Means of arrival:  Comments: Hold for triage 7

## 2019-02-26 NOTE — ED Triage Notes (Signed)
Pt reports hearing voices that are telling him to harm himself that started around 0100.
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
## 2019-02-26 NOTE — BH Assessment (Signed)
[facility] Assessment Progress Note  Per Jinny Blossom, FNP, this pt requires psychiatric hospitalization at this time.  Brook McNichols, RN, [facility] has assigned pt to [facility] Rm 500-1; [facility] will be ready to receive pt at 17:00.  Pt has signed Voluntary Admission and Consent for Treatment, as well as Consent to Release Information to no one, and signed forms have been faxed to [facility].  Pt's nurse, Eustaquio Maize, has been notified, and agrees to send original paperwork along with pt via [facility], and to call report to [phone].  Jalene Mullet, [place] Coordinator [phone]

## 2019-02-27 DIAGNOSIS — F332 Major depressive disorder, recurrent severe without psychotic features: Principal | ICD-10-CM

## 2019-02-27 LAB — GLUCOSE, CAPILLARY
Glucose-Capillary: >600 mg/dL (ref 70–99)
Glucose-Capillary: 449 mg/dL — ABNORMAL HIGH (ref 70–99)
Glucose-Capillary: 333 mg/dL — ABNORMAL HIGH (ref 70–99)
Glucose-Capillary: 224 mg/dL — ABNORMAL HIGH (ref 70–99)
Glucose-Capillary: 360 mg/dL — ABNORMAL HIGH (ref 70–99)

## 2019-02-27 LAB — HEMOGLOBIN A1C
Hgb A1c MFr Bld: 10.4 % — ABNORMAL HIGH (ref 4.8–5.6)
Mean Plasma Glucose: 251.78 mg/dL

## 2019-02-27 MED ORDER — METFORMIN HCL 500 MG PO TABS
500.0000 mg | ORAL_TABLET | Freq: Two times a day (BID) | ORAL | Status: DC
  Administered 2019-02-27 – 2019-02-28 (×2): 500 mg via ORAL
  Filled 2019-02-27 (×4): qty 1

## 2019-02-27 MED ORDER — INSULIN ASPART 100 UNIT/ML ~~LOC~~ SOLN
12.0000 [IU] | Freq: Once | SUBCUTANEOUS | Status: AC
  Administered 2019-02-27: 13:00:00 12 [IU] via SUBCUTANEOUS

## 2019-02-27 MED ORDER — SERTRALINE HCL 25 MG PO TABS
25.0000 mg | ORAL_TABLET | Freq: Every day | ORAL | Status: DC
  Administered 2019-02-27 – 2019-03-01 (×3): 25 mg via ORAL
  Filled 2019-02-27 (×6): qty 1

## 2019-02-27 MED ORDER — QUETIAPINE FUMARATE 200 MG PO TABS
200.0000 mg | ORAL_TABLET | Freq: Every evening | ORAL | Status: DC | PRN
  Administered 2019-02-27: 22:00:00 200 mg via ORAL
  Filled 2019-02-27 (×5): qty 1

## 2019-02-27 MED ORDER — INSULIN ASPART 100 UNIT/ML ~~LOC~~ SOLN
0.0000 [IU] | Freq: Three times a day (TID) | SUBCUTANEOUS | Status: DC
  Administered 2019-02-27: 3 [IU] via SUBCUTANEOUS
  Administered 2019-02-27 – 2019-02-28 (×2): 9 [IU] via SUBCUTANEOUS

## 2019-02-27 MED ORDER — INSULIN ASPART 100 UNIT/ML ~~LOC~~ SOLN
12.0000 [IU] | Freq: Once | SUBCUTANEOUS | Status: AC
  Administered 2019-02-27: 12 [IU] via SUBCUTANEOUS

## 2019-02-27 NOTE — Progress Notes (Signed)
D Pt is UAL on the 500 hall today. He is quiet, isolative, does not initiate conversation with this Probation officer. Her endorses a flat depressed mood.     A HE refused to answer the daily assessment quesitons, but is able to contract verbally with writer to not hurt himslef. His cbg's were 600 at lunchtime ( recieived 12 u novolog per MD order) then 449 ( recheck and received 6 units novolog ) and at 1522 his cbg was 333. At dinner, his cbg was 224 and he received his scheduled po meds with 3 units novolog per MD order toelrated well.     R Safety in place.

## 2019-02-27 NOTE — Plan of Care (Signed)
  Problem: Education: Goal: Emotional status will improve Outcome: Progressing   

## 2019-02-27 NOTE — Progress Notes (Signed)
[place] NOVEL CORONAVIRUS (COVID-19) DAILY CHECK-OFF SYMPTOMS - answer yes or no to each - every day NO YES  Have you had a fever in the past 24 hours?  . Fever (Temp > 37.80C / 100F) X   Have you had any of these symptoms in the past 24 hours? . New Cough .  Sore Throat  .  Shortness of Breath .  Difficulty Breathing .  Unexplained Body Aches   X   Have you had any one of these symptoms in the past 24 hours not related to allergies?   . Runny Nose .  Nasal Congestion .  Sneezing   X   If you have had runny nose, nasal congestion, sneezing in the past 24 hours, has it worsened?  X   EXPOSURES - check yes or no X   Have you traveled outside the state in the past 14 days?  X   Have you been in contact with someone with a confirmed diagnosis of COVID-19 or PUI in the past 14 days without wearing appropriate PPE?  X   Have you been living in the same home as a person with confirmed diagnosis of COVID-19 or a PUI (household contact)?    X   Have you been diagnosed with COVID-19?    X              What to do next: Answered NO to all: Answered YES to anything:   Proceed with unit schedule Follow the [facility] Inpatient Flowsheet.   
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
## 2019-02-27 NOTE — [facility] Group Notes (Signed)
Group was deferred for outdoor time today.  Stuart Aguado Grossman-Orr, LCSW 02/27/2019, 12:01 PM   

## 2019-02-27 NOTE — Progress Notes (Signed)
D: Pt passive SI/ [facility]- contracts for safety. Pt is pleasant and cooperative. Pt visible in dayroom playing cards with peers. Pt appears paranoid at times, but very interactive with peers / staff. EKG completed this evening  A: Pt was offered support and encouragement. Pt was given scheduled medications. Pt was encourage to attend groups. Q 15 minute checks were done for safety.   R:Pt attends groups and interacts well with peers and staff. Pt is taking medication. Pt has no complaints.Pt receptive to treatment and safety maintained on unit.
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
## 2019-02-28 LAB — GLUCOSE, CAPILLARY
Glucose-Capillary: 357 mg/dL — ABNORMAL HIGH (ref 70–99)
Glucose-Capillary: 520 mg/dL (ref 70–99)
Glucose-Capillary: 470 mg/dL — ABNORMAL HIGH (ref 70–99)
Glucose-Capillary: 364 mg/dL — ABNORMAL HIGH (ref 70–99)
Glucose-Capillary: 147 mg/dL — ABNORMAL HIGH (ref 70–99)

## 2019-02-28 MED ORDER — INSULIN ASPART 100 UNIT/ML ~~LOC~~ SOLN: 10.0000 [IU] | Freq: Once | SUBCUTANEOUS | Status: AC

## 2019-02-28 MED ORDER — ARIPIPRAZOLE 5 MG PO TABS
5.0000 mg | ORAL_TABLET | Freq: Every day | ORAL | Status: DC
  Administered 2019-02-28 – 2019-03-02 (×3): 5 mg via ORAL
  Filled 2019-02-28 (×2): qty 1
  Filled 2019-02-28: qty 7
  Filled 2019-02-28 (×3): qty 1

## 2019-02-28 MED ORDER — INSULIN ASPART 100 UNIT/ML ~~LOC~~ SOLN
0.0000 [IU] | Freq: Three times a day (TID) | SUBCUTANEOUS | Status: DC
  Administered 2019-02-28: 20 [IU] via SUBCUTANEOUS
  Administered 2019-03-01: 11 [IU] via SUBCUTANEOUS
  Administered 2019-03-01 – 2019-03-02 (×3): 7 [IU] via SUBCUTANEOUS

## 2019-02-28 MED ORDER — INSULIN ASPART 100 UNIT/ML ~~LOC~~ SOLN
12.0000 [IU] | Freq: Once | SUBCUTANEOUS | Status: AC
  Administered 2019-02-28: 12 [IU] via SUBCUTANEOUS

## 2019-02-28 MED ORDER — METFORMIN HCL 850 MG PO TABS
850.0000 mg | ORAL_TABLET | Freq: Two times a day (BID) | ORAL | Status: DC
  Administered 2019-02-28: 17:00:00 850 mg via ORAL
  Filled 2019-02-28 (×4): qty 1

## 2019-02-28 MED ORDER — QUETIAPINE FUMARATE 100 MG PO TABS
100.0000 mg | ORAL_TABLET | Freq: Every evening | ORAL | Status: DC | PRN
  Administered 2019-02-28: 100 mg via ORAL
  Filled 2019-02-28 (×4): qty 1

## 2019-02-28 MED ORDER — INSULIN ASPART 100 UNIT/ML ~~LOC~~ SOLN
10.0000 [IU] | Freq: Once | SUBCUTANEOUS | Status: AC
  Administered 2019-02-28: 10 [IU] via SUBCUTANEOUS

## 2019-02-28 NOTE — Progress Notes (Signed)
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  Objective: Patient is seen and examined.  Patient is a 27 year old male with the above-stated past psychiatric history who is seen in follow-up.  He stated he is more depressed today.  He stated he is more depressed today because of the diagnosis of diabetes.  Yesterday's blood sugar reached over 600, and today it is down at least to 327.  We discussed the changes in his treatment for this, and also discussed the possibility that the Seroquel could make it worse.  He is tearful about that.  He denied any suicidal ideation today, but did state that he was depressed about it.  He denied any auditory or visual hallucinations today.  His blood sugar this morning was 357.  His vital signs are stable, he is afebrile.  He slept 6.75 hours last night.  Principal Problem: <principal problem not specified> Diagnosis: Active Problems:   MDD (major depressive disorder), recurrent severe, without psychosis ([place])  Total Time spent with patient: 15 minutes  Past Psychiatric History: See admission H&P  Past Medical History:  Past Medical History:  Diagnosis Date  . Anxiety   . Depression   . No pertinent past medical history     Past Surgical History:  Procedure Laterality Date  . CLOSED REDUCTION NASAL FRACTURE  04/07/2012   Procedure: CLOSED REDUCTION NASAL FRACTURE;  Surgeon: Theodoro Kos, DO;  Location: [place];  Service: Plastics;  Laterality: N/A;  . NO PAST SURGERIES    . ORIF TIBIA FRACTURE  04/05/2012   Procedure: OPEN REDUCTION INTERNAL FIXATION (ORIF) TIBIA FRACTURE;  Surgeon: Mauri Pole, MD;  Location: [place];  Service: Orthopedics;  Laterality: Left;   Family History:  Family History  Problem Relation Age of Onset  . Arthritis Other        grandparent  . Breast cancer Other   . Heart disease Other        grandparent  . Hypertension Other        grandparent  . Diabetes Other        parent  . Diabetes Other        grandparent   Family Psychiatric  History: See admission H&P Social History:  Social History   Substance and Sexual Activity  Alcohol Use Not Currently     Social History   Substance and Sexual Activity  Drug Use Yes  . Types: Cocaine    Social History   Socioeconomic History  . Marital status: Single    Spouse name: Not on file  . Number of children: Not on file  . Years of education: Not on file  . Highest education level: Not on file  Occupational History  . Not on file  Social Needs  . Financial resource strain: Not on file  . Food insecurity    Worry: Not on file    Inability: Not on file  . Transportation needs    Medical: Not on file    Non-medical: Not on file  Tobacco Use  . Smoking status: Current Every Day Smoker    Packs/day: 1.00    Types: Cigars, Cigarettes  . Smokeless tobacco: Never Used  . Tobacco comment: few  cigarettes a day  Substance and Sexual Activity  . Alcohol use: Not Currently  . Drug use: Yes    Types: Cocaine  . Sexual activity: Not Currently  Lifestyle  . Physical activity    Days per week: Not on file    Minutes per session: Not on file  . Stress: Not on file  Relationships  . Social Musicianconnections    Talks on phone: Not on file    Gets together: Not on file    Attends religious service: Not on file    Active member of club or organization: Not on file    Attends meetings of clubs or organizations: Not on file    Relationship status: Not on file  Other Topics Concern  . Not on file  Social History Narrative   Work or School: Horticulturist, commerciallandscaping      Home Situation: lives with grandparents       Spiritual Beliefs: Christian      Lifestyle: no regular exercise but hard work as Administratorlandscaper - poor diet            Additional Social History:                         Sleep: Good  Appetite:  Good  Current Medications: Current Facility-Administered Medications  Medication Dose Route Frequency Provider Last Rate Last Dose  . acetaminophen (TYLENOL) tablet 650 mg  650 mg Oral Q6H PRN Laveda AbbeParks, Laurie Britton, NP      . alum & mag hydroxide-simeth (MAALOX/MYLANTA) 200-200-20 MG/5ML suspension 30 mL  30 mL Oral Q4H PRN Laveda AbbeParks, Laurie Britton, NP      . ARIPiprazole (ABILIFY) tablet 5 mg  5 mg Oral Daily Antonieta Pertlary, Loman Logan Lawson, MD   5 mg at 02/28/19 [id number]  . hydrOXYzine (ATARAX/VISTARIL) tablet 25 mg  25 mg Oral TID PRN Laveda AbbeParks, Laurie Britton, NP   25 mg at 02/26/19 2116  . insulin aspart (novoLOG) injection 0-20 Units  0-20 Units Subcutaneous TID WC Antonieta Pertlary, Meilani Edmundson Lawson, MD      . insulin aspart (novoLOG) injection 0-9 Units  0-9 Units Subcutaneous TID WC Antonieta Pertlary, Perian Tedder Lawson, MD   9 Units at 02/28/19 [phone]  . magnesium hydroxide (MILK OF MAGNESIA) suspension 30 mL  30 mL Oral Daily PRN Laveda AbbeParks, Laurie Britton, NP      . metFORMIN (GLUCOPHAGE) tablet 850 mg  850 mg Oral BID WC Antonieta Pertlary, Sergey Ishler Lawson, MD      . nicotine (NICODERM CQ - dosed in mg/24 hours) patch 21 mg  21 mg Transdermal Daily Malvin JohnsFarah, Brian, MD   21 mg at 02/28/19 0956  . prazosin (MINIPRESS) capsule 1 mg  1 mg Oral QHS Laveda AbbeParks, Laurie Britton, NP   1 mg at 02/27/19 2132  . QUEtiapine (SEROQUEL) tablet 100 mg  100 mg Oral QHS,MR X 1 Medardo Hassing, Marlane MingleGreg Lawson, MD      . sertraline (ZOLOFT) tablet 25 mg  25 mg Oral Daily Antonieta Pertlary, Callaghan Laverdure Lawson, MD   25 mg at 02/28/19 0955  . traZODone (DESYREL) tablet 50 mg  50 mg Oral QHS PRN Laveda AbbeParks, Laurie Britton, NP   50 mg at 02/26/19 2116    Lab Results:  Results for orders placed or performed during the hospital encounter of 02/26/19 (from the past 48 hour(s))  Hemoglobin A1c     Status: Abnormal    Collection Time: 02/27/19  6:28 AM  Result Value Ref Range   Hgb A1c MFr Bld 10.4 (H) 4.8 -  5.6 %    Comment: (NOTE) Pre diabetes:          5.7%-6.4% Diabetes:              >6.4% Glycemic control for   <7.0% adults with diabetes    Mean Plasma Glucose 251.78 mg/dL    Comment: Performed at [facility], [address] [place], [place] [address]  Glucose, capillary     Status: Abnormal   Collection Time: 02/27/19 12:03 PM  Result Value Ref Range   Glucose-Capillary >600 (HH) 70 - 99 mg/dL  Glucose, capillary     Status: Abnormal   Collection Time: 02/27/19  1:58 PM  Result Value Ref Range   Glucose-Capillary 449 (H) 70 - 99 mg/dL  Glucose, capillary     Status: Abnormal   Collection Time: 02/27/19  3:22 PM  Result Value Ref Range   Glucose-Capillary 333 (H) 70 - 99 mg/dL  Glucose, capillary     Status: Abnormal   Collection Time: 02/27/19  5:27 PM  Result Value Ref Range   Glucose-Capillary 224 (H) 70 - 99 mg/dL  Glucose, capillary     Status: Abnormal   Collection Time: 02/27/19  9:34 PM  Result Value Ref Range   Glucose-Capillary 360 (H) 70 - 99 mg/dL  Glucose, capillary     Status: Abnormal   Collection Time: 02/28/19  6:01 AM  Result Value Ref Range   Glucose-Capillary 357 (H) 70 - 99 mg/dL    Blood Alcohol level:  Lab Results  Component Value Date   ETH <10 02/26/2019   ETH <5 05/12/2017    Metabolic Disorder Labs: Lab Results  Component Value Date   HGBA1C 10.4 (H) 02/27/2019   MPG 251.78 02/27/2019   MPG 119.76 05/02/2017   Lab Results  Component Value Date   PROLACTIN 47.5 (H) 10/05/2016   Lab Results  Component Value Date   CHOL 189 05/02/2017   TRIG 111 05/02/2017   HDL 47 05/02/2017   CHOLHDL 4.0 05/02/2017   VLDL 22 05/02/2017   LDLCALC 120 (H) 05/02/2017   LDLCALC 102 (H) 10/03/2016    Physical Findings: AIMS: Facial and Oral Movements Muscles of Facial Expression: None, normal Lips and Perioral Area: None, normal Jaw: None,  normal Tongue: None, normal,Extremity Movements Upper (arms, wrists, hands, fingers): None, normal Lower (legs, knees, ankles, toes): None, normal, Trunk Movements Neck, shoulders, hips: None, normal, Overall Severity Severity of abnormal movements (highest score from questions above): None, normal Incapacitation due to abnormal movements: None, normal Patient's awareness of abnormal movements (rate only patient's report): No Awareness, Dental Status Current problems with teeth and/or dentures?: No Does patient usually wear dentures?: No  CIWA:    COWS:     Musculoskeletal: Strength & Muscle Tone: within normal limits Gait & Station: normal Patient leans: N/A  Psychiatric Specialty Exam: Physical Exam  Nursing note and vitals reviewed. Constitutional: He is oriented to person, place, and time. He appears well-developed and well-nourished.  HENT:  Head: Normocephalic and atraumatic.  Respiratory: Effort normal.  Neurological: He is alert and oriented to person, place, and time.    ROS  Blood pressure 131/86, pulse 96, temperature 98.2 F (36.8 C), resp. rate 20, height 5\' 7"  (1.702 m), weight 115.7 kg, SpO2 98 %.Body mass index is 39.94 kg/m.  General Appearance: Disheveled  Eye Contact:  Fair  Speech:  Slow  Volume:  Decreased  Mood:  Depressed  Affect:  Congruent  Thought Process:  Coherent and Descriptions  of Associations: Circumstantial  Orientation:  Full (Time, Place, and Person)  Thought Content:  Rumination  Suicidal Thoughts:  No  Homicidal Thoughts:  No  Memory:  Immediate;   Fair Recent;   Fair Remote;   Fair  Judgement:  Fair  Insight:  Fair  Psychomotor Activity:  Decreased  Concentration:  Concentration: Fair and Attention Span: Fair  Recall:  FiservFair  Fund of Knowledge:  Fair  Language:  Fair  Akathisia:  Negative  Handed:  Right  AIMS (if indicated):     Assets:  Desire for Improvement Resilience  ADL's:  Intact  Cognition:  WNL  Sleep:  Number  of Hours: 6.75     Treatment Plan Summary: Daily contact with patient to assess and evaluate symptoms and progress in treatment, Medication management and Plan : Patient is seen and examined.  Patient is a 28 year old male admitted on 02/27/2019 secondary to psychosis and suicidal ideation.  On this admission he was also diagnosed with type 2 diabetes.   Diagnosis: #1 posttraumatic stress disorder versus major depression with psychotic features versus substance-induced psychotic disorder as well as mood disorder.  #2 diabetes mellitus type 2  Patient is seen in follow-up.  He is more depressed today about his diagnosis of diabetes.  He is actually a bit tearful over that.  His blood sugars have remained elevated, so I am going to increase his metformin 2000 mg p.o. twice daily.  Additionally I have changed his sliding scale insulin from mild to moderate.  With regard to his psychiatric medications we discussed the fact that the Seroquel could potentially lead to additional weight gain as well as exacerbation of his diabetes.  I told him that we could substitute another medication similar to that that would have less weight gain associated with it.  He is agreed to a trial of Abilify.  I am going to start him out initially on 5 mg of Abilify daily, and decrease his Seroquel to 100 mg p.o. nightly.  We will try and taper this off prior to discharge.  The sertraline 25 mg p.o. daily was just started yesterday, and I believe that where it is for now.  It may need to be increased during the course of the hospitalization. 1.  Increase metformin to 850 mg p.o. twice daily with food for diabetes mellitus type 2. 2.  Start Abilify 5 mg p.o. daily for mood stability and psychosis. 3.  Continue hydroxyzine 25 mg p.o. 3 times daily as needed anxiety. 4.  Continue sliding scale insulin for diabetes mellitus. 5.  Continue Minipress 1 mg p.o. nightly for PTSD related nightmares and flashbacks. 6.  Decrease Seroquel to  100 mg p.o. nightly for psychosis, sedation and mood stability. 7.  Continue Zoloft 25 mg p.o. daily for mood and anxiety. 8.  Continue trazodone 50 mg p.o. nightly as needed insomnia. 9.  Disposition planning-in progress. Antonieta PertGreg Lawson Tarry Fountain, MD 02/28/2019, 10:47 AM
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
## 2019-02-28 NOTE — Plan of Care (Signed)
Nurse discussed anxiety, depression and coping skills with patient.  

## 2019-02-28 NOTE — Progress Notes (Signed)
[place] NOVEL CORONAVIRUS (COVID-19) DAILY CHECK-OFF SYMPTOMS - answer yes or no to each - every day NO YES  Have you had a fever in the past 24 hours?  . Fever (Temp > 37.80C / 100F) X   Have you had any of these symptoms in the past 24 hours? . New Cough .  Sore Throat  .  Shortness of Breath .  Difficulty Breathing .  Unexplained Body Aches   X   Have you had any one of these symptoms in the past 24 hours not related to allergies?   . Runny Nose .  Nasal Congestion .  Sneezing   X   If you have had runny nose, nasal congestion, sneezing in the past 24 hours, has it worsened?  X   EXPOSURES - check yes or no X   Have you traveled outside the state in the past 14 days?  X   Have you been in contact with someone with a confirmed diagnosis of COVID-19 or PUI in the past 14 days without wearing appropriate PPE?  X   Have you been living in the same home as a person with confirmed diagnosis of COVID-19 or a PUI (household contact)?    X   Have you been diagnosed with COVID-19?    X              What to do next: Answered NO to all: Answered YES to anything:   Proceed with unit schedule Follow the [facility] Inpatient Flowsheet.   

## 2019-02-28 NOTE — Progress Notes (Signed)
[place] NOVEL CORONAVIRUS (COVID-19) DAILY CHECK-OFF SYMPTOMS - answer yes or no to each - every day NO YES  Have you had a fever in the past 24 hours?  . Fever (Temp > 37.80C / 100F) X   Have you had any of these symptoms in the past 24 hours? . New Cough .  Sore Throat  .  Shortness of Breath .  Difficulty Breathing .  Unexplained Body Aches   X   Have you had any one of these symptoms in the past 24 hours not related to allergies?   . Runny Nose .  Nasal Congestion .  Sneezing   X   If you have had runny nose, nasal congestion, sneezing in the past 24 hours, has it worsened?  X   EXPOSURES - check yes or no X   Have you traveled outside the state in the past 14 days?  X   Have you been in contact with someone with a confirmed diagnosis of COVID-19 or PUI in the past 14 days without wearing appropriate PPE?  X   Have you been living in the same home as a person with confirmed diagnosis of COVID-19 or a PUI (household contact)?    X   Have you been diagnosed with COVID-19?    X              What to do next: Answered NO to all: Answered YES to anything:   Proceed with unit schedule Follow the [facility] Inpatient Flowsheet.   

## 2019-02-28 NOTE — [facility] Counselor (Signed)
Clinical Social Work Note  At pt requested, CSW contacted his employer Shawn at [employer] and informed him that pt has missed shifts recently due to being in the hospital.  This was done, and the employer only wanted to know if the illness was COVID-related.  CSW told him it was not.  He asked that pt contact him as soon as out of the hospital, and this was relayed to pt.   Selmer Dominion, LCSW 02/28/2019, 1:24 PM

## 2019-02-28 NOTE — [facility] Group Notes (Signed)
[place] LCSW Group Therapy Note  Date/Time:  02/28/2019  11:00AM-12:00PM  Type of Therapy and Topic:  Group Therapy:  Music and Mood  Participation Level:  Minimal   Description of Group: In this process group, members listened to a variety of genres of music and identified that different types of music evoke different responses.  Patients were encouraged to identify music that was soothing for them and music that was energizing for them.  Patients discussed how this knowledge can help with wellness and recovery in various ways including managing depression and anxiety as well as encouraging healthy sleep habits.    Therapeutic Goals: 1. Patients will explore the impact of different varieties of music on mood 2. Patients will verbalize the thoughts they have when listening to different types of music 3. Patients will identify music that is soothing to them as well as music that is energizing to them 4. Patients will discuss how to use this knowledge to assist in maintaining wellness and recovery 5. Patients will explore the use of music as a coping skill  Summary of Patient Progress:  At the beginning of group, patient expressed that he felt beat down and depressed, rating this an 8 on a 1-10 scale.  He interacted well initially and said the music was making him feel inspired and blessed.  However, he then appeared to go to sleep and was sleeping at the end of group, was not roused by CSW's saying his name out loud.  Therapeutic Modalities: Solution Focused Brief Therapy Activity   Selmer Dominion, LCSW

## 2019-02-28 NOTE — Progress Notes (Signed)
D:  Patient  Has been in his bed resting most of the day.  Patient's CBG has been monitored almost every hour because of high blood sugar.  Patient denied SI ands HI, contracts for safety.  Denied A/V hallucinations.  A:  Medications administered per MD orders.  Emotional support and encouragement given patient. R:  Safety maintained with 15 minute checks.

## 2019-02-28 NOTE — Progress Notes (Signed)
DAR NOTE: Pt present with flat affect and depressed mood in the unit. Pt has been isolating himself this evening. CBG at bedtime was 360 mg/dl. Pt denies physical pain, took all his meds as scheduled.  Pt's safety ensured with 15 minute and environmental checks. Pt currently endorse passive  SI and A/V hallucinations. Pt verbally agrees to seek staff if SI/HI or A/VH occurs and to consult with staff before acting on these thoughts. Will continue POC.

## 2019-03-01 LAB — GLUCOSE, CAPILLARY
Glucose-Capillary: 269 mg/dL — ABNORMAL HIGH (ref 70–99)
Glucose-Capillary: 232 mg/dL — ABNORMAL HIGH (ref 70–99)
Glucose-Capillary: 230 mg/dL — ABNORMAL HIGH (ref 70–99)
Glucose-Capillary: 337 mg/dL — ABNORMAL HIGH (ref 70–99)

## 2019-03-01 MED ORDER — TOPIRAMATE 25 MG PO TABS
50.0000 mg | ORAL_TABLET | Freq: Two times a day (BID) | ORAL | Status: DC
  Administered 2019-03-01 – 2019-03-02 (×2): 50 mg via ORAL
  Filled 2019-03-01: qty 2
  Filled 2019-03-01: qty 28
  Filled 2019-03-01 (×3): qty 2
  Filled 2019-03-01: qty 28

## 2019-03-01 MED ORDER — PRAZOSIN HCL 2 MG PO CAPS
2.0000 mg | ORAL_CAPSULE | Freq: Every day | ORAL | Status: DC
  Administered 2019-03-01: 22:00:00 2 mg via ORAL
  Filled 2019-03-01: qty 7
  Filled 2019-03-01 (×2): qty 1

## 2019-03-01 MED ORDER — METFORMIN HCL 500 MG PO TABS
1000.0000 mg | ORAL_TABLET | Freq: Two times a day (BID) | ORAL | Status: DC
  Administered 2019-03-01 – 2019-03-02 (×3): 1000 mg via ORAL
  Filled 2019-03-01: qty 2
  Filled 2019-03-01: qty 28
  Filled 2019-03-01 (×2): qty 2
  Filled 2019-03-01: qty 28
  Filled 2019-03-01 (×2): qty 2

## 2019-03-01 MED ORDER — TEMAZEPAM 30 MG PO CAPS
30.0000 mg | ORAL_CAPSULE | Freq: Every day | ORAL | Status: DC
  Administered 2019-03-01: 30 mg via ORAL
  Filled 2019-03-01: qty 1

## 2019-03-01 MED ORDER — SERTRALINE HCL 50 MG PO TABS
50.0000 mg | ORAL_TABLET | Freq: Every day | ORAL | Status: DC
  Administered 2019-03-02: 08:00:00 50 mg via ORAL
  Filled 2019-03-01 (×2): qty 1
  Filled 2019-03-01: qty 7

## 2019-03-01 NOTE — Progress Notes (Signed)
[facility] MD Progress Note  03/01/2019 11:44 AM Stuart Rivers  MRN:  [id number] Subjective:    Patient elaborates that he had psychotic symptoms prior to cocaine abuse that they existed with and without this substance abuse he also has some PTSD symptoms.  Is interested in medication for weight loss.  Denies thoughts of harming self here can contract. 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 Anxiety   . Depression   . No pertinent past medical history     Past Surgical History:  Procedure Laterality Date  . CLOSED REDUCTION NASAL FRACTURE  04/07/2012   Procedure: CLOSED REDUCTION NASAL FRACTURE;  Surgeon: Theodoro Kos, DO;  Location: [place];  Service: Plastics;  Laterality: N/A;  . NO PAST SURGERIES    . ORIF TIBIA FRACTURE  04/05/2012   Procedure: OPEN REDUCTION INTERNAL FIXATION (ORIF) TIBIA FRACTURE;  Surgeon: Mauri Pole, MD;  Location: [place];  Service: Orthopedics;  Laterality: Left;   Family History:  Family History  Problem Relation Age of Onset  . Arthritis Other        grandparent  . Breast cancer Other   . Heart disease Other        grandparent  . Hypertension Other        grandparent  . Diabetes Other        parent  . Diabetes Other        grandparent   Family Psychiatric  History: see eval  Social History:  Social History   Substance and Sexual Activity  Alcohol Use Not Currently     Social History   Substance and Sexual Activity  Drug Use Yes  . Types: Cocaine    Social History   Socioeconomic History  . Marital status: Single    Spouse name: Not on file  . Number of children: Not on file  . Years of education: Not on file  .  Highest education level: Not on file  Occupational History  . Not on file  Social Needs  . Financial resource strain: Not on file  . Food insecurity    Worry: Not on file    Inability: Not on file  . Transportation needs    Medical: Not on file    Non-medical: Not on file  Tobacco Use  . Smoking status: Current Every Day Smoker    Packs/day: 1.00    Types: Cigars, Cigarettes  . Smokeless tobacco: Never Used  . Tobacco comment: few cigarettes a day  Substance and Sexual Activity  . Alcohol use: Not Currently  . Drug use: Yes    Types: Cocaine  . Sexual activity: Not Currently  Lifestyle  . Physical activity    Days per week: Not on file    Minutes per session: Not on file  . Stress: Not on file  Relationships  . Social Herbalist on phone: Not on file    Gets together: Not on file    Attends religious service: Not on file    Active member of club or organization: Not on file    Attends meetings of clubs or organizations: Not on file    Relationship status: Not on file  Other Topics Concern  . Not on  file  Social History Narrative   Work or School: Horticulturist, commerciallandscaping      Home Situation: lives with grandparents      Spiritual Beliefs: Christian      Lifestyle: no regular exercise but hard work as Administratorlandscaper - poor diet            Additional Social History:                         Sleep: Good  Appetite:  Good  Current Medications: Current Facility-Administered Medications  Medication Dose Route Frequency Provider Last Rate Last Dose  . acetaminophen (TYLENOL) tablet 650 mg  650 mg Oral Q6H PRN Laveda AbbeParks, Laurie Britton, NP      . alum & mag hydroxide-simeth (MAALOX/MYLANTA) 200-200-20 MG/5ML suspension 30 mL  30 mL Oral Q4H PRN Laveda AbbeParks, Laurie Britton, NP      . ARIPiprazole (ABILIFY) tablet 5 mg  5 mg Oral Daily Antonieta Pertlary, Greg Lawson, MD   5 mg at 03/01/19 0830  . hydrOXYzine (ATARAX/VISTARIL) tablet 25 mg  25 mg Oral TID PRN Laveda AbbeParks, Laurie Britton, NP    25 mg at 02/26/19 2116  . insulin aspart (novoLOG) injection 0-20 Units  0-20 Units Subcutaneous TID WC Antonieta Pertlary, Greg Lawson, MD   11 Units at 03/01/19 [phone]  . magnesium hydroxide (MILK OF MAGNESIA) suspension 30 mL  30 mL Oral Daily PRN Laveda AbbeParks, Laurie Britton, NP      . metFORMIN (GLUCOPHAGE) tablet 1,000 mg  1,000 mg Oral BID WC Antonieta Pertlary, Greg Lawson, MD   1,000 mg at 03/01/19 0831  . nicotine (NICODERM CQ - dosed in mg/24 hours) patch 21 mg  21 mg Transdermal Daily Malvin JohnsFarah, Quina Wilbourne, MD   21 mg at 03/01/19 0829  . prazosin (MINIPRESS) capsule 2 mg  2 mg Oral QHS Malvin JohnsFarah, Jenne Sellinger, MD      . Melene Muller[START ON 03/02/2019] sertraline (ZOLOFT) tablet 50 mg  50 mg Oral Daily Malvin JohnsFarah, Jerlisa Diliberto, MD      . temazepam (RESTORIL) capsule 30 mg  30 mg Oral QHS Malvin JohnsFarah, Hudsen Fei, MD      . topiramate (TOPAMAX) tablet 50 mg  50 mg Oral BID Malvin JohnsFarah, Kani Chauvin, MD      . traZODone (DESYREL) tablet 50 mg  50 mg Oral QHS PRN Laveda AbbeParks, Laurie Britton, NP   50 mg at 02/28/19 2115    Lab Results:  Results for orders placed or performed during the hospital encounter of 02/26/19 (from the past 48 hour(s))  Glucose, capillary     Status: Abnormal   Collection Time: 02/27/19 12:03 PM  Result Value Ref Range   Glucose-Capillary >600 (HH) 70 - 99 mg/dL  Glucose, capillary     Status: Abnormal   Collection Time: 02/27/19  1:58 PM  Result Value Ref Range   Glucose-Capillary 449 (H) 70 - 99 mg/dL  Glucose, capillary     Status: Abnormal   Collection Time: 02/27/19  3:22 PM  Result Value Ref Range   Glucose-Capillary 333 (H) 70 - 99 mg/dL  Glucose, capillary     Status: Abnormal   Collection Time: 02/27/19  5:27 PM  Result Value Ref Range   Glucose-Capillary 224 (H) 70 - 99 mg/dL  Glucose, capillary     Status: Abnormal   Collection Time: 02/27/19  9:34 PM  Result Value Ref Range   Glucose-Capillary 360 (H) 70 - 99 mg/dL  Glucose, capillary     Status: Abnormal   Collection Time: 02/28/19  6:01 AM  Result Value Ref Range   Glucose-Capillary 357 (H) 70  - 99 mg/dL  Glucose, capillary     Status: Abnormal   Collection Time: 02/28/19  1:12 PM  Result Value Ref Range   Glucose-Capillary 520 (HH) 70 - 99 mg/dL   Comment 1 Notify RN   Glucose, capillary     Status: Abnormal   Collection Time: 02/28/19  2:31 PM  Result Value Ref Range   Glucose-Capillary 470 (H) 70 - 99 mg/dL  Glucose, capillary     Status: Abnormal   Collection Time: 02/28/19  4:08 PM  Result Value Ref Range   Glucose-Capillary 364 (H) 70 - 99 mg/dL  Glucose, capillary     Status: Abnormal   Collection Time: 02/28/19  8:02 PM  Result Value Ref Range   Glucose-Capillary 147 (H) 70 - 99 mg/dL  Glucose, capillary     Status: Abnormal   Collection Time: 03/01/19  6:09 AM  Result Value Ref Range   Glucose-Capillary 269 (H) 70 - 99 mg/dL    Blood Alcohol level:  Lab Results  Component Value Date   ETH <10 02/26/2019   ETH <5 05/12/2017    Metabolic Disorder Labs: Lab Results  Component Value Date   HGBA1C 10.4 (H) 02/27/2019   MPG 251.78 02/27/2019   MPG 119.76 05/02/2017   Lab Results  Component Value Date   PROLACTIN 47.5 (H) 10/05/2016   Lab Results  Component Value Date   CHOL 189 05/02/2017   TRIG 111 05/02/2017   HDL 47 05/02/2017   CHOLHDL 4.0 05/02/2017   VLDL 22 05/02/2017   LDLCALC 120 (H) 05/02/2017   LDLCALC 102 (H) 10/03/2016    Physical Findings: AIMS: Facial and Oral Movements Muscles of Facial Expression: None, normal Lips and Perioral Area: None, normal Jaw: None, normal Tongue: None, normal,Extremity Movements Upper (arms, wrists, hands, fingers): None, normal Lower (legs, knees, ankles, toes): None, normal, Trunk Movements Neck, shoulders, hips: None, normal, Overall Severity Severity of abnormal movements (highest score from questions above): None, normal Incapacitation due to abnormal movements: None, normal Patient's awareness of abnormal movements (rate only patient's report): No Awareness, Dental Status Current problems  with teeth and/or dentures?: No Does patient usually wear dentures?: No  CIWA:    COWS:     Musculoskeletal: Strength & Muscle Tone: within normal limits Gait & Station: normal Patient leans: N/A  Psychiatric Specialty Exam: Physical Exam  ROS  Blood pressure 112/67, pulse (!) 106, temperature 97.7 F (36.5 C), resp. rate 18, height 5\' 7"  (1.702 m), weight 115.7 kg, SpO2 98 %.Body mass index is 39.94 kg/m.  General Appearance: Casual  Eye Contact:  Good  Speech:  Clear and Coherent  Volume:  Normal  Mood:  Anxious and Dysphoric  Affect:  Appropriate and Congruent  Thought Process:  Coherent and Descriptions of Associations: Intact  Orientation:  Full (Time, Place, and Person)  Thought Content:  Logical  Suicidal Thoughts:  No  Homicidal Thoughts:  No  Memory:  Immediate;   Fair  Judgement:  Fair  Insight:  Fair  Psychomotor Activity:  Normal  Concentration:  Concentration: Fair  Recall:  [employer]Fair  Fund of Knowledge:  Fair  Language:  Fair  Akathisia:  Negative  Handed:  Right  AIMS (if indicated):     Assets:  Financial Resources/Insurance Housing Physical Health Resilience Social Support  ADL's:  Intact  Cognition:  WNL  Sleep:  Number of Hours: 6.75     Treatment Plan Summary: Daily contact  with patient to assess and evaluate symptoms and progress in treatment, Medication management and Plan Continue current aripiprazole may use long-acting injectable discussed further continue contracting here cognitive therapy escalate prazosin and escalate sertraline discontinue quetiapine due to weight gain add topiramate due to weight gain  Sheldon Amara, MD 03/01/2019, 11:44 AM

## 2019-03-01 NOTE — Tx Team (Signed)
Interdisciplinary Treatment and Diagnostic Plan Update  03/01/2019 Time of Session: 11:04am GRAE CANNATA MRN: [id number]  Principal Diagnosis: <principal problem not specified>  Secondary Diagnoses: Active Problems:   MDD (major depressive disorder), recurrent severe, without psychosis ([place])   Current Medications:  Current Facility-Administered Medications  Medication Dose Route Frequency Provider Last Rate Last Dose  . acetaminophen (TYLENOL) tablet 650 mg  650 mg Oral Q6H PRN Ethelene Hal, NP      . alum & mag hydroxide-simeth (MAALOX/MYLANTA) 200-200-20 MG/5ML suspension 30 mL  30 mL Oral Q4H PRN Ethelene Hal, NP      . ARIPiprazole (ABILIFY) tablet 5 mg  5 mg Oral Daily Sharma Covert, MD   5 mg at 03/01/19 0830  . hydrOXYzine (ATARAX/VISTARIL) tablet 25 mg  25 mg Oral TID PRN Ethelene Hal, NP   25 mg at 02/26/19 2116  . insulin aspart (novoLOG) injection 0-20 Units  0-20 Units Subcutaneous TID WC Sharma Covert, MD   7 Units at 03/01/19 1233  . magnesium hydroxide (MILK OF MAGNESIA) suspension 30 mL  30 mL Oral Daily PRN Ethelene Hal, NP      . metFORMIN (GLUCOPHAGE) tablet 1,000 mg  1,000 mg Oral BID WC Sharma Covert, MD   1,000 mg at 03/01/19 0831  . nicotine (NICODERM CQ - dosed in mg/24 hours) patch 21 mg  21 mg Transdermal Daily Johnn Hai, MD   21 mg at 03/01/19 0829  . prazosin (MINIPRESS) capsule 2 mg  2 mg Oral QHS Johnn Hai, MD      . Derrill Memo ON 03/02/2019] sertraline (ZOLOFT) tablet 50 mg  50 mg Oral Daily Johnn Hai, MD      . temazepam (RESTORIL) capsule 30 mg  30 mg Oral QHS Johnn Hai, MD      . topiramate (TOPAMAX) tablet 50 mg  50 mg Oral BID Johnn Hai, MD      . traZODone (DESYREL) tablet 50 mg  50 mg Oral QHS PRN Ethelene Hal, NP   50 mg at 02/28/19 2115   PTA Medications: Medications Prior to Admission  Medication Sig Dispense Refill Last Dose  . hydrOXYzine (ATARAX/VISTARIL) 25 MG tablet  Take 1 tablet (25 mg total) by mouth every 6 (six) hours as needed for anxiety. (Patient not taking: Reported on 02/26/2019) 30 tablet 0   . prazosin (MINIPRESS) 1 MG capsule Take 1 capsule (1 mg total) by mouth at bedtime. For nightmares (Patient not taking: Reported on 02/26/2019) 30 capsule 0   . QUEtiapine (SEROQUEL) 200 MG tablet Take 1 tablet (200 mg total) by mouth at bedtime. For mood control (Patient not taking: Reported on 02/26/2019) 30 tablet 0   . sertraline (ZOLOFT) 50 MG tablet Take 1 tablet (50 mg total) by mouth daily. For mood control (Patient not taking: Reported on 02/26/2019) 30 tablet 0   . traZODone (DESYREL) 50 MG tablet Take 1 tablet (50 mg total) by mouth at bedtime as needed for sleep. (Patient not taking: Reported on 02/26/2019) 30 tablet 0     Patient Stressors: Financial difficulties Medication change or noncompliance Substance abuse  Patient Strengths: Ability for insight Active sense of humor Average or above average intelligence Capable of independent living Communication skills  Treatment Modalities: Medication Management, Group therapy, Case management,  1 to 1 session with clinician, Psychoeducation, Recreational therapy.   Physician Treatment Plan for Primary Diagnosis: <principal problem not specified> Long Term Goal(s): Improvement in symptoms so as ready for discharge  Improvement in symptoms so as ready for discharge   Short Term Goals: Ability to identify changes in lifestyle to reduce recurrence of condition will improve Ability to verbalize feelings will improve Ability to disclose and discuss suicidal ideas Ability to demonstrate self-control will improve Ability to identify and develop effective coping behaviors will improve Ability to maintain clinical measurements within normal limits will improve Compliance with prescribed medications will improve Ability to identify triggers associated with substance abuse/mental health issues will  improve Ability to identify changes in lifestyle to reduce recurrence of condition will improve Ability to verbalize feelings will improve Ability to disclose and discuss suicidal ideas Ability to demonstrate self-control will improve Ability to identify and develop effective coping behaviors will improve Ability to maintain clinical measurements within normal limits will improve Compliance with prescribed medications will improve Ability to identify triggers associated with substance abuse/mental health issues will improve  Medication Management: Evaluate patient's response, side effects, and tolerance of medication regimen.  Therapeutic Interventions: 1 to 1 sessions, Unit Group sessions and Medication administration.  Evaluation of Outcomes: Progressing  Physician Treatment Plan for Secondary Diagnosis: Active Problems:   MDD (major depressive disorder), recurrent severe, without psychosis ([facility])  Long Term Goal(s): Improvement in symptoms so as ready for discharge Improvement in symptoms so as ready for discharge   Short Term Goals: Ability to identify changes in lifestyle to reduce recurrence of condition will improve Ability to verbalize feelings will improve Ability to disclose and discuss suicidal ideas Ability to demonstrate self-control will improve Ability to identify and develop effective coping behaviors will improve Ability to maintain clinical measurements within normal limits will improve Compliance with prescribed medications will improve Ability to identify triggers associated with substance abuse/mental health issues will improve Ability to identify changes in lifestyle to reduce recurrence of condition will improve Ability to verbalize feelings will improve Ability to disclose and discuss suicidal ideas Ability to demonstrate self-control will improve Ability to identify and develop effective coping behaviors will improve Ability to maintain clinical measurements  within normal limits will improve Compliance with prescribed medications will improve Ability to identify triggers associated with substance abuse/mental health issues will improve     Medication Management: Evaluate patient's response, side effects, and tolerance of medication regimen.  Therapeutic Interventions: 1 to 1 sessions, Unit Group sessions and Medication administration.  Evaluation of Outcomes: Progressing   RN Treatment Plan for Primary Diagnosis: <principal problem not specified> Long Term Goal(s): Knowledge of disease and therapeutic regimen to maintain health will improve  Short Term Goals: Ability to participate in decision making will improve, Ability to verbalize feelings will improve, Ability to disclose and discuss suicidal ideas, Ability to identify and develop effective coping behaviors will improve and Compliance with prescribed medications will improve  Medication Management: RN will administer medications as ordered by provider, will assess and evaluate patient's response and provide education to patient for prescribed medication. RN will report any adverse and/or side effects to prescribing provider.  Therapeutic Interventions: 1 on 1 counseling sessions, Psychoeducation, Medication administration, Evaluate responses to treatment, Monitor vital signs and CBGs as ordered, Perform/monitor CIWA, COWS, AIMS and Fall Risk screenings as ordered, Perform wound care treatments as ordered.  Evaluation of Outcomes: Progressing   LCSW Treatment Plan for Primary Diagnosis: <principal problem not specified> Long Term Goal(s): Safe transition to appropriate next level of care at discharge, Engage patient in therapeutic group addressing interpersonal concerns.  Short Term Goals: Engage patient in aftercare planning with referrals and resources and Increase  skills for wellness and recovery  Therapeutic Interventions: Assess for all discharge needs, 1 to 1 time with Social  worker, Explore available resources and support systems, Assess for adequacy in community support network, Educate family and significant other(s) on suicide prevention, Complete Psychosocial Assessment, Interpersonal group therapy.  Evaluation of Outcomes: Progressing   Progress in Treatment: Attending groups: Yes. Participating in groups: Yes. Taking medication as prescribed: Yes. Toleration medication: Yes. Family/Significant other contact made: No, will contact:  pt's mother Patient understands diagnosis: No. Discussing patient identified problems/goals with staff: Yes. Medical problems stabilized or resolved: Yes. Denies suicidal/homicidal ideation: Yes. Issues/concerns per patient self-inventory: No. Other:   New problem(s) identified: No, Describe:  None  New Short Term/Long Term Goal(s): Medication stabilization, elimination of SI thoughts, and development of a comprehensive mental wellness plan.   Patient Goals:    Discharge Plan or Barriers: CSW will continue to follow up for appropriate referrals and possible discharge planning  Reason for Continuation of Hospitalization: Anxiety Depression Medical Issues Medication stabilization  Estimated Length of Stay: 2-3 days  Attendees: Patient: 03/01/2019  Physician: Dr. Malvin JohnsBrian Farah, MD 03/01/2019  Nursing: Lanora ManisElizabeth, RN 03/01/2019   RN Care Manager: 03/01/2019   Social Worker: Stephannie PetersJasmine Samaiya Awadallah, LCSW 03/01/2019   Recreational Therapist:  03/01/2019   Other:  03/01/2019   Other:  03/01/2019   Other: 03/01/2019      Scribe for Treatment Team: Delphia GratesJasmine M Donaven Criswell, LCSW 03/01/2019 1:14 PM

## 2019-03-01 NOTE — Discharge Instructions (Signed)
Diabetes Label Reading Tips ° °Check the Nutrition Facts on food labels for nutrient information for the food. °The Nutrition Facts information is based on a standard serving size.  °However, that serving size may not be the same as the serving size used in carbohydrate counting. °Always start by checking the serving size on the label. Is this the serving size you will be eating?  °How many servings are in the package? °Next, look at the total carbohydrate. It is measured in grams (g).  °To find the number of carbohydrate servings in 1 standard serving of a food, divide the total grams of carbohydrate by 15.  °One (1) carbohydrate serving is the amount of food with 15 g carbohydrate. °You don’t need to count grams of sugars. They are included in the total carbohydrate. °The label shows how many calories are in the standard serving. It also lists the amount of fat, cholesterol, sodium, °protein, and some vitamins and minerals.  °Look below the line listing total fat to find out how much of that fat is saturated fat or trans fat.  °Choose foods that are low in these kinds of fats because they are not healthy for your heart.  °In the foods that are healthiest for your heart, grams of saturated fat and trans fat are less than one-third of the total fat grams. °If foods are very low in calories (less than 20 calories per serving) or carbohydrates (5 g carbohydrate or less per °serving), you may not need to count them when you count carbohydrates.  ° ° ° °Carbohydrate Counting for People with Diabetes °Why Is Carbohydrate Counting Important? °Counting carbohydrate servings may help you control your blood glucose level so that you feel better. °The balance between the carbohydrates you eat and insulin determines what your blood glucose level will be after °eating. °Carbohydrate counting can also help you plan your meals. ° °Which Foods Have Carbohydrates? °Foods with carbohydrates include: °Breads, crackers, and  cereals °Pasta, rice, and grains °Starchy vegetables, such as potatoes, corn, and peas °Beans and legumes °Milk, soy milk, and yogurt °Fruits and fruit juices °Sweets, such as cakes, cookies, ice cream, jam, and jelly ° °Carbohydrate Servings °In diabetes meal planning, 1 serving of a food with carbohydrate has about 15 grams of carbohydrate: °Check serving sizes with measuring cups and spoons or a food scale. °Read the Nutrition Facts on food labels to find out how many grams of carbohydrate are in foods you eat. °The food lists in this handout show portions that have about 15 grams of carbohydrate. ° °Meal Planning Tips °An Eating Plan tells you how many carbohydrate servings to eat at your meals and snacks.  °For many adults, eating 3-5 servings of carbohydrate foods at each meal and 1 or 2 carbohydrate servings for each snack works well. °In a healthy daily Eating Plan, most carbohydrates come from: °At least 6 servings of fruits and nonstarchy vegetables °At least 6 servings of grains, beans, and starchy vegetables, with at least 3 servings from whole grains °At least 2 servings of milk or milk products ° °Check your blood glucose level regularly. It can tell you if you need to adjust when you eat carbohydrates. °Eating foods that have fiber, such as whole grains, and having very few salty foods is good for your health. °Eat 4-6 ounces of meat or other protein foods (such as soybean burgers) each day.  °Choose low-fat sources of protein, such as lean beef, lean pork, chicken, fish, low-fat cheese, or   vegetarian foods such as soy. °Eat some healthy fats, such as olive oil, canola oil, and nuts. °Eat very little saturated fats. These unhealthy fats are found in butter, cream, and high-fat meats, such as bacon and °sausage. °Eat very little or no trans fats. These unhealthy fats are found in all foods that list “partially hydrogenated oil” as an °Ingredient. ° °Label Reading Tips °The Nutrition Facts panel on a  label lists the grams of total carbohydrate in 1 standard serving.  °The label’s standard serving may be larger or smaller than 1 carbohydrate serving. °To figure out how many carbohydrate servings are in the food: °First, look at the label’s standard serving size. °Check the grams of total carbohydrate. This is the amount of carbohydrate in 1 standard serving. °Divide the grams of total carbohydrate by 15. This number equals the number of carbohydrate servings in 1 standard °serving.  °Remember: 1 carbohydrate serving is 15 grams of carbohydrate. °Note: You may ignore the grams of sugars on the Nutrition Facts panel because they are included in the grams of °total carbohydrate. ° °Foods Recommended °1 serving = about 15 grams of carbohydrate °Starches °1 slice bread (1 ounce) °1 tortilla (6-inch size) °¼ large bagel (1 ounce) °2 taco shells (5-inch size) °½ hamburger or hot dog bun (¾ ounce) °¾ cup ready-to-eat unsweetened cereal °½ cup cooked cereal °1 cup broth-based soup °4 to 6 small crackers °1/3 cup pasta or rice (cooked) °½ cup beans, peas, corn, sweet potatoes, winter squash, or mashed or boiled potatoes (cooked) °¼ large baked potato (3 ounces) °¾ ounce pretzels, potato chips, or tortilla chips °3 cups popcorn (popped) °Fruit °1 small fresh fruit (¾ to 1 cup) °½ cup canned or frozen fruit °2 tablespoons dried fruit (blueberries, cherries, cranberries, mixed fruit, raisins) °17 small grapes (3 ounces) °1 cup melon or berries °½ cup unsweetened fruit juice °Milk °1 cup fat-free or reduced-fat milk °1 cup soy milk °2/3 cup (6 ounces) nonfat yogurt sweetened with sugar-free sweetener °Sweets and Desserts °2-inch square cake (unfrosted) °2 small cookies (2/3 ounce) °½ cup ice cream or frozen yogurt °¼ cup sherbet or sorbet °1 tablespoon syrup, jam, jelly, table sugar, or honey °2 tablespoons light syrup °Other Foods °Count 1 cup raw vegetables or ½ cup cooked nonstarchy vegetables as zero (0) carbohydrate  servings or “free” °foods. If you eat 3 or more servings at one meal, count them as 1 carbohydrate serving. °Foods that have less than 20 calories in each serving also may be counted as zero carbohydrate servings or “free” °foods. °Count 1 cup of casserole or other mixed foods as 2 carbohydrate servings. ° °Carbohydrate Counting for People with Diabetes Sample 1-Day Menu °Breakfast °1 extra-small banana (1 carbohydrate serving) °3/4 cup corn flakes (1 carbohydrate serving) °1 cup low-fat or fat-free milk (1 carbohydrate serving) °1 slice whole wheat bread (1 carbohydrate serving) °1 teaspoon margarine °Lunch °2 ounces turkey slices °2 slices whole wheat bread (2 carbohydrate servings) °2 lettuce leaves °4 celery sticks °4 carrot sticks °1 medium apple (1 carbohydrate serving) °1 cup low-fat or fat-free milk (1 carbohydrate serving) °Afternoon Snack °2 tablespoons raisins (1 carbohydrate serving) °3/4 ounce unsalted mini pretzels (1 carbohydrate serving) °Evening Meal °3 ounces lean roast beef °1/2 large baked potato (2 carbohydrate servings) °1 tablespoon reduced-fat sour cream °1/2 cup green beans °1 cup vegetable salad °1 tablespoon light salad dressing °1 whole wheat dinner roll (1 carbohydrate serving) °1 teaspoon margarine °1 cup melon balls (1 carbohydrate serving) °Evening Snack °  6 ounces low-fat sugar-free fruit yogurt (1 carbohydrate serving) °2 tablespoons unsalted nuts ° °Carbohydrate Counting for People with Diabetes Vegan Sample 1-Day Menu °Breakfast °1 cup cooked oatmeal (2 carbohydrate servings) °½ cup blueberries (1 carbohydrate serving) °2 tablespoons flaxseeds °1 cup soymilk fortified with calcium and vitamin D °1 cup coffee °Lunch °2 slices whole wheat bread (2 carbohydrate servings) °½ cup baked tofu °¼ cup lettuce °2 slices tomato °2 slices avocado °½ cup baby carrots °1 orange (1 carbohydrate serving) °1 cup soymilk fortified with calcium and vitamin D °Evening Meal °Burrito made with: 1 6-inch  corn tortilla (1 carbohydrate serving) °1 cup refried vegetarian beans (1 carbohydrate serving) °¼ cup chopped tomatoes °¼ cup lettuce °¼ cup salsa °1/3 cup brown rice (1 carbohydrate serving) °1 tablespoon olive oil for rice °½ cup zucchini °Evening Snack °6 small whole grain crackers (1 carbohydrate serving) °2 apricots (½ carbohydrate serving) °¼ cup unsalted peanuts (½ carbohydrate serving) ° °Carbohydrate Counting for People with Diabetes Vegetarian (Lacto-Ovo) Sample 1-Day Menu °Breakfast °1 cup cooked oatmeal (2 carbohydrate servings) °½ cup blueberries (1 carbohydrate serving) °2 tablespoons flaxseeds °1 egg °1 cup 1% milk (1 carbohydrate serving) °1 cup coffee °Lunch °2 slices whole wheat bread (2 carbohydrate servings) °2 ounces low-fat cheese °¼ cup lettuce °2 slices tomato °2 slices avocado °½ cup baby carrots °1 orange (1 carbohydrate serving) °1 cup unsweetened tea °Evening Meal °Burrito made with: 1 6-inch corn tortilla (1 carbohydrate serving) °½ cup refried vegetarian beans (1 carbohydrate serving) °¼ cup tomatoes °¼ cup lettuce °¼ cup salsa °1/3 cup brown rice (1 carbohydrate serving) °1 tablespoon olive oil for rice °½ cup zucchini °1 cup 1% milk (1 carbohydrate serving) °Evening Snack °6 small whole grain crackers (1 carbohydrate serving) °2 apricots (½ carbohydrate serving) °¼ cup unsalted peanuts (½ carbohydrate serving) °

## 2019-03-01 NOTE — Progress Notes (Signed)
NUTRITION NOTE RD working remotely.   RD consulted for nutrition education regarding diabetes.   Lab Results  Component Value Date   HGBA1C 10.4 (H) 02/27/2019    Notes indicate that patient has a new diagnosis of type 2 DM. Will place handouts from the Academy of Nutrition and Dietetics in Discharge Instructions: "Carbohydrate Counting for People with Diabetes" and "Label Reading Tips for Diabetes". This will outline different food groups and their effects on blood sugar, emphasizing carbohydrate-containing foods. Handouts also provide a list of carbohydrates and recommended serving sizes of common foods. Handouts outline the importance of controlled and consistent carbohydrate intake throughout the day and planning meals ahead of time.   Expect good to fair compliance.  Body mass index is 39.94 kg/m. Pt meets criteria for obesity, borderline morbid obesity based on current BMI.  Current diet order is Carb Modified and patient is eating as desired for meals and snacks at this time.  Labs reviewed; CBG: 269 mg/dl this AM. Medications reviewed; sliding scale novolog, 1000 mg metformin BID  No further nutrition interventions warranted at this time. RD contact information provided. If additional nutrition issues arise, please re-consult RD.    Jarome Matin, MS, RD, LDN, [facility] Inpatient Clinical Dietitian Pager # [phone] After hours/weekend pager # [phone]

## 2019-03-01 NOTE — Progress Notes (Signed)
DAR NOTE: Patient presents with anxious affect and mood.  Denies suicidal thoughts, pain, auditory and visual hallucinations.  Described energy level as normal and concentration as good.  Rates depression at 0, hopelessness at 0, and anxiety at 0.  Maintained on routine safety checks.  Medications given as prescribed.  Support and encouragement offered as needed.  Attended group and participated.   Patient observed socializing with peers in the dayroom.  Patient is safe on and off the unit.  Offered no complaint.

## 2019-03-02 LAB — GLUCOSE, CAPILLARY
Glucose-Capillary: 225 mg/dL — ABNORMAL HIGH (ref 70–99)
Glucose-Capillary: 355 mg/dL — ABNORMAL HIGH (ref 70–99)

## 2019-03-02 MED ORDER — TEMAZEPAM 30 MG PO CAPS: 30.0000 mg | ORAL_CAPSULE | Freq: Every day | ORAL | 0 refills | Status: DC

## 2019-03-02 MED ORDER — SERTRALINE HCL 50 MG PO TABS: 50.0000 mg | ORAL_TABLET | Freq: Every day | ORAL | 1 refills | Status: DC

## 2019-03-02 MED ORDER — METFORMIN HCL 1000 MG PO TABS: 1000.0000 mg | ORAL_TABLET | Freq: Two times a day (BID) | ORAL | 1 refills | Status: DC

## 2019-03-02 MED ORDER — TOPIRAMATE 50 MG PO TABS: 50.0000 mg | ORAL_TABLET | Freq: Two times a day (BID) | ORAL | 2 refills | Status: DC

## 2019-03-02 MED ORDER — PRAZOSIN HCL 2 MG PO CAPS: 2.0000 mg | ORAL_CAPSULE | Freq: Every day | ORAL | 2 refills | Status: DC

## 2019-03-02 MED ORDER — ARIPIPRAZOLE 5 MG PO TABS: 5.0000 mg | ORAL_TABLET | Freq: Every day | ORAL | 2 refills | Status: DC

## 2019-03-02 NOTE — Progress Notes (Addendum)
  [facility] Adult Case Management Discharge Plan :  Will you be returning to the same living situation after discharge:  Yes,  with grandparents At discharge, do you have transportation home?: Yes,  another patient is driving the pt to his car Do you have the ability to pay for your medications: Yes,  pt states that he has medicaid  Release of information consent forms completed and in the chart;  Patient's signature needed at discharge.  Patient to Follow up at: Follow-up Information    [facility] Follow up on 03/05/2019.   Why: Telephonic hospital follow up appointment is Friday, 7/10 at 8:30a.  The provider will contact you for the appointment. Contact information: [address] [place] [address] [phone]           Next level of care provider has access to [place] and Suicide Prevention discussed: Yes,  pt's mother     Has patient been referred to the Quitline?: Patient refused referral  Patient has been referred for addiction treatment: Yes  Trecia Rogers, LCSW 03/02/2019, 11:24 AM

## 2019-03-02 NOTE — Progress Notes (Signed)
Discharge note  Patient verbalizes readiness for discharge. Follow up plan explained, AVS, Transition record and SRA given. Prescriptions and teaching provided. Belongings returned and signed for. Suicide safety plan completed and signed. Patient verbalizes understanding. Patient denies SI/HI and assures this Probation officer he will seek assistance should that change. Patient discharged to lobby where pt was riding home with another pt.

## 2019-03-02 NOTE — [facility] Suicide Risk Assessment (Signed)
[facility] INPATIENT:  Family/Significant Other Suicide Prevention Education  Suicide Prevention Education:  Education Completed; Pt's mother, Alfonse Spruce, has been identified by the patient as the family member/significant other with whom the patient will be residing, and identified as the person(s) who will aid the patient in the event of a mental health crisis (suicidal ideations/suicide attempt).  With written consent from the patient, the family member/significant other has been provided the following suicide prevention education, prior to the and/or following the discharge of the patient.  The suicide prevention education provided includes the following:  Suicide risk factors  Suicide prevention and interventions  National Suicide Hotline telephone number  [facility] assessment telephone number  [facility] Emergency Assistance [place] and/or Residential Mobile Crisis Unit telephone number  Request made of family/significant other to:  Remove weapons (e.g., guns, rifles, knives), all items previously/currently identified as safety concern.    Remove drugs/medications (over-the-counter, prescriptions, illicit drugs), all items previously/currently identified as a safety concern.  The family member/significant other verbalizes understanding of the suicide prevention education information provided.  The family member/significant other agrees to remove the items of safety concern listed above.    CSW contacted pt's mother, Alfonse Spruce. Pt's mother stated that she was not aware that he was being discharged from the hospital. Pt's mother stated that the pt told her that he was diagnosed in jail with schizophrenia. Pt's mother asked about his diagnosis and his medication. Pt's mother stated that the pt just got out of jail and is on probation. She stated that he is supposed to be going to an [facility] for the cocaine use because if he is back on cocaine then  he will violate his probation and go to prison. Pt's mother stated that the pt has switched his addiction to food which is causing major concerns with his diabetes and asked about aftercare for addiction. CSW confirmed that the pt is going to mental health aftercare.    Trecia Rogers 03/02/2019, 11:21 AM

## 2019-03-02 NOTE — Plan of Care (Signed)
D: Patient is alert, oriented, pleasant, and cooperative. Denies SI, HI, AVH, and verbally contracts for safety.    A: Medications administered per MD order. Support provided. Patient educated on safety on the unit and medications. Routine safety checks every 15 minutes. Patient stated understanding to tell nurse about any new physical symptoms. Patient understands to tell staff of any needs.     R: No adverse drug reactions noted. Patient verbally contracts for safety. Patient remains safe at this time and will continue to monitor.   Problem: Safety: Goal: Periods of time without injury will increase Outcome: Progressing   Patient remains safe and will continue to monitor.  [place] NOVEL CORONAVIRUS (COVID-19) DAILY CHECK-OFF SYMPTOMS - answer yes or no to each - every day NO YES  Have you had a fever in the past 24 hours?  Fever (Temp > 37.80C / 100F) X   Have you had any of these symptoms in the past 24 hours? New Cough  Sore Throat   Shortness of Breath  Difficulty Breathing  Unexplained Body Aches   X   Have you had any one of these symptoms in the past 24 hours not related to allergies?   Runny Nose  Nasal Congestion  Sneezing   X   If you have had runny nose, nasal congestion, sneezing in the past 24 hours, has it worsened?  X   EXPOSURES - check yes or no X   Have you traveled outside the state in the past 14 days?  X   Have you been in contact with someone with a confirmed diagnosis of COVID-19 or PUI in the past 14 days without wearing appropriate PPE?  X   Have you been living in the same home as a person with confirmed diagnosis of COVID-19 or a PUI (household contact)?    X   Have you been diagnosed with COVID-19?    X              What to do next: Answered NO to all: Answered YES to anything:   Proceed with unit schedule Follow the [facility] Inpatient Flowsheet.    

## 2019-03-02 NOTE — [facility] Suicide Risk Assessment (Signed)
[facility] Discharge Suicide Risk Assessment   Principal Problem: PTSD MDD cocaine abuse Discharge Diagnoses: Active Problems:   MDD (major depressive disorder), recurrent severe, without psychosis ([place])   Total Time spent with patient: 45 minutes  Musculoskeletal: Strength & Muscle Tone: within normal limits Gait & Station: normal Patient leans: N/A  Psychiatric Specialty Exam: ROS  Blood pressure 104/76, pulse (!) 108, temperature 97.9 F (36.6 C), resp. rate 20, height 5\' 7"  (1.702 m), weight 115.7 kg, SpO2 98 %.Body mass index is 39.94 kg/m.  General Appearance: Casual  Eye Contact::  Good  Speech:  Clear and Coherent409  Volume:  Decreased  Mood:  Euthymic  Affect:  Full Range  Thought Process:  Coherent and Descriptions of Associations: Intact  Orientation:  Full (Time, Place, and Person)  Thought Content:  Tangential  Suicidal Thoughts:  No  Homicidal Thoughts:  No  Memory:  3/3  Judgement:  Intact  Insight:  Good  Psychomotor Activity:  Normal  Concentration:  Good  Recall:  Good  Fund of Knowledge:Good  Language: Good  Akathisia:  Negative  Handed:  Right  AIMS (if indicated):     Assets:  Communication Skills Desire for Improvement Physical Health Resilience Social Support  Sleep:  Number of Hours: 6  Cognition: WNL  ADL's:  Intact   Mental Status Per Nursing Assessment::   On Admission:  Suicidal ideation indicated by patient, Intention to act on suicide plan, Self-harm thoughts  Demographic Factors:  Unemployed  Loss Factors: Decrease in vocational status  Historical Factors: NA  Risk Reduction Factors:   Sense of responsibility to family and Religious beliefs about death  Continued Clinical Symptoms:  Alcohol/Substance Abuse/Dependencies  Cognitive Features That Contribute To Risk:  None    Suicide Risk:  Minimal: No identifiable suicidal ideation.  Patients presenting with no risk factors but with morbid ruminations; may be classified as  minimal risk based on the severity of the depressive symptoms  Follow-up Information    [facility] Follow up on 03/12/2019.   Why: Telephonic hospital follow up appointment is Friday, 7/17 at 9:30a.  The provider will contact you for the appointment. Contact information: [address] [place] [address] [phone]           Plan Of Care/Follow-up recommendations:  Activity:  full  Shelvy Perazzo, MD 03/02/2019, 10:23 AM
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
## 2020-06-13 ENCOUNTER — Ambulatory Visit: Payer: Self-pay | Admitting: Physician Assistant

## 2020-06-13 ENCOUNTER — Other Ambulatory Visit: Payer: Self-pay

## 2020-06-13 DIAGNOSIS — Z202 Contact with and (suspected) exposure to infections with a predominantly sexual mode of transmission: Secondary | ICD-10-CM

## 2020-06-13 DIAGNOSIS — Z113 Encounter for screening for infections with a predominantly sexual mode of transmission: Secondary | ICD-10-CM

## 2020-06-13 MED ORDER — METRONIDAZOLE 500 MG PO TABS: 2000.0000 mg | ORAL_TABLET | Freq: Once | ORAL | 0 refills | Status: AC

## 2020-06-13 NOTE — Progress Notes (Signed)
Patient states he is here "just for treatment to Trich." States his partner has an orange card stating she has [place]. Declines STD testing today. Tawny Hopping, RN

## 2020-06-13 NOTE — Progress Notes (Signed)
Treated per standing orders as a contact to Trich. PCP list given. Tawny Hopping, RN

## 2020-06-15 ENCOUNTER — Encounter: Payer: Self-pay | Admitting: Physician Assistant
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
## 2020-06-29 ENCOUNTER — Ambulatory Visit: Payer: Self-pay | Admitting: Gerontology

## 2020-07-17 ENCOUNTER — Other Ambulatory Visit: Payer: Self-pay

## 2020-07-17 ENCOUNTER — Ambulatory Visit: Payer: Medicaid Other | Admitting: Physician Assistant

## 2020-07-17 DIAGNOSIS — Z113 Encounter for screening for infections with a predominantly sexual mode of transmission: Secondary | ICD-10-CM

## 2020-07-17 DIAGNOSIS — Z299 Encounter for prophylactic measures, unspecified: Secondary | ICD-10-CM

## 2020-07-17 LAB — GRAM STAIN

## 2020-07-17 MED ORDER — METRONIDAZOLE 500 MG PO TABS: 500.0000 mg | ORAL_TABLET | Freq: Two times a day (BID) | ORAL | 0 refills | Status: DC

## 2020-07-17 NOTE — Progress Notes (Signed)
Gram stain reviewed with provider and is negative today. Pt received Metronidazole 500mg  #14, 1 tab po BID x 7 days per , PA order. Counseled pt per provider orders and pt states understanding. Provider orders completed.

## 2020-07-18 ENCOUNTER — Encounter: Payer: Self-pay | Admitting: Physician Assistant
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
## 2020-07-22 LAB — GONOCOCCUS CULTURE

## 2020-08-01 ENCOUNTER — Ambulatory Visit: Payer: Medicaid Other | Admitting: Gerontology

## 2020-08-01 ENCOUNTER — Encounter: Payer: Self-pay | Admitting: Gerontology

## 2020-08-01 ENCOUNTER — Other Ambulatory Visit: Payer: Self-pay

## 2020-08-01 ENCOUNTER — Ambulatory Visit: Payer: Federal, State, Local not specified - Other | Admitting: Pharmacy Technician

## 2020-08-01 VITALS — BP 130/70 | HR 77 | Temp 98.0°F | Resp 16

## 2020-08-01 DIAGNOSIS — E119 Type 2 diabetes mellitus without complications: Secondary | ICD-10-CM | POA: Insufficient documentation

## 2020-08-01 DIAGNOSIS — Z79899 Other long term (current) drug therapy: Secondary | ICD-10-CM

## 2020-08-01 DIAGNOSIS — G8929 Other chronic pain: Secondary | ICD-10-CM | POA: Insufficient documentation

## 2020-08-01 DIAGNOSIS — F4310 Post-traumatic stress disorder, unspecified: Secondary | ICD-10-CM

## 2020-08-01 DIAGNOSIS — Z7689 Persons encountering health services in other specified circumstances: Secondary | ICD-10-CM

## 2020-08-01 DIAGNOSIS — M79641 Pain in right hand: Secondary | ICD-10-CM

## 2020-08-01 MED ORDER — ACETAMINOPHEN 325 MG PO TABS: 650.0000 mg | ORAL_TABLET | Freq: Two times a day (BID) | ORAL | 0 refills | Status: DC

## 2020-08-01 MED ORDER — BLOOD GLUCOSE MONITOR KIT: PACK | 0 refills | Status: DC

## 2020-08-01 MED ORDER — METFORMIN HCL 500 MG PO TABS: 500.0000 mg | ORAL_TABLET | Freq: Two times a day (BID) | ORAL | 0 refills | Status: DC

## 2020-08-01 NOTE — Progress Notes (Signed)
Patient ID: Stuart Rivers, male   DOB: [birth year], 29 y.o.   MRN: [id number]  No chief complaint on file.   HPI Stuart Rivers is a 29 y.o. male who presents to establish care and evaluation of his chronic conditions. He resides at [facility] for rehabilitation. He has a history of of Bipolar and takes Lamictal 25 mg daily and Prazosin 2 mg for PTSD, and follows up at [facility] for his Mental health care. He states that his mood is good and he denies suicidal nor homicidal ideation. He  He also has a history of type 2 diabetes and his HgbA1c done one year ago was 10.4%.He takes 500 mg Metformin bid and checks his blood glucose bid. He states that his fasting reading was160 mg/dl this morning, and it was 130 mg/dl when checked during his visit. He denies hypo/hyperglycemic symptoms, peripheral neuropathy, performs daily foot check. He also reports experiencing intermittent dull non radiating pain to right hand which has being going on for many years. He states that pain is chronic and using right hand constantly at work aggravates symptoms and taking tylenol relieves symptoms. He vapes and admits the desire to quit. Overall, he states that he's doing well and offers no further complaint.  Past Medical History:  Diagnosis Date  . Anxiety   . Depression   . No pertinent past medical history     Past Surgical History:  Procedure Laterality Date  . CLOSED REDUCTION NASAL FRACTURE  04/07/2012   Procedure: CLOSED REDUCTION NASAL FRACTURE;  Surgeon: Theodoro Kos, DO;  Location: [place];  Service: Plastics;  Laterality: N/A;  . NO PAST SURGERIES    . ORIF TIBIA FRACTURE  04/05/2012   Procedure: OPEN REDUCTION INTERNAL FIXATION (ORIF) TIBIA FRACTURE;  Surgeon: Mauri Pole, MD;  Location: [place];  Service: Orthopedics;  Laterality: Left;    Family History  Problem Relation Age of Onset  . Arthritis Other        grandparent  . Breast cancer Other   . Heart disease Other        grandparent  .  Hypertension Other        grandparent  . Diabetes Other        parent  . Diabetes Other        grandparent    Social History Social History   Tobacco Use  . Smoking status: Former Smoker    Packs/day: 1.00    Types: Cigars, Cigarettes    Quit date: 05/26/2020    Years since quitting: 0.1  . Smokeless tobacco: Never Used  . Tobacco comment: few cigarettes a day  Vaping Use  . Vaping Use: Every day  . Substances: Nicotine  Substance Use Topics  . Alcohol use: Not Currently  . Drug use: Not on file    Comment: no drugs since 04/16/2020    Allergies  Allergen Reactions  . Other     latex    Current Outpatient Medications  Medication Sig Dispense Refill  . lamoTRIgine (LAMICTAL) 25 MG tablet Take 25 mg by mouth daily. Pt to taper up on the dose    . metFORMIN (GLUCOPHAGE) 500 MG tablet Take 1 tablet (500 mg total) by mouth 2 (two) times daily with a meal. 60 tablet 0  . prazosin (MINIPRESS) 2 MG capsule Take 1 capsule (2 mg total) by mouth at bedtime. 30 capsule 2  . acetaminophen (TYLENOL) 325 MG tablet Take 2 tablets (650 mg total) by mouth 2 (  two) times daily. 30 tablet 0  . blood glucose meter kit and supplies KIT Dispense based on patient and insurance preference. Use up to four times daily as directed. (FOR ICD-9 250.00, 250.01). 1 each 0   No current facility-administered medications for this visit.    Review of Systems Review of Systems  Constitutional: Negative.   HENT: Negative.   Eyes: Negative.   Respiratory: Negative.   Cardiovascular: Negative.   Gastrointestinal: Negative.   Endocrine: Negative.   Genitourinary: Negative.   Musculoskeletal: Positive for arthralgias (intermittent right hand pain).  Skin: Negative.   Neurological: Negative.   Hematological: Negative.   Psychiatric/Behavioral: Negative.     Blood pressure 130/70, pulse 77, temperature 98 F (36.7 C), resp. rate 16, SpO2 98 %.  Physical Exam Physical Exam HENT:     Head:  Normocephalic and atraumatic.     Nose:     Comments: Deferred per Covid protocol    Mouth/Throat:     Comments: Deferred per Covid protocol Eyes:     Extraocular Movements: Extraocular movements intact.     Conjunctiva/sclera: Conjunctivae normal.     Pupils: Pupils are equal, round, and reactive to light.  Cardiovascular:     Rate and Rhythm: Normal rate and regular rhythm.     Pulses: Normal pulses.     Heart sounds: Normal heart sounds.  Pulmonary:     Effort: Pulmonary effort is normal.     Breath sounds: Normal breath sounds.  Abdominal:     General: Bowel sounds are normal.     Palpations: Abdomen is soft.  Genitourinary:    Comments: Deferred per patient Musculoskeletal:        General: Normal range of motion.     Cervical back: Normal range of motion.  Skin:    General: Skin is warm and dry.  Neurological:     General: No focal deficit present.     Mental Status: He is alert and oriented to person, place, and time. Mental status is at baseline.  Psychiatric:        Mood and Affect: Mood normal.        Behavior: Behavior normal.        Thought Content: Thought content normal.        Judgment: Judgment normal.     Data Reviewed Past medical  History was reviewed.  Assessment  and Plan  1. PTSD (post-traumatic stress disorder) - He will continue on current treatment regimen and follow up at [facility]. He was advised to call the Crisis help line with worsening symptoms.  2. Type 2 diabetes mellitus without complication, without long-term current use of insulin ([place]) -  Will obtain A1c tomorrow, but he will continue Metformin 500 mg bid, low carb/non concentrated sweet diet and exercise as tolerated. He was encouraged to continue checking his blood glucose bid, his fasting goal should be between 80-130 mg/dl. - metFORMIN (GLUCOPHAGE) 500 MG tablet; Take 1 tablet (500 mg total) by mouth 2 (two) times daily with a meal.  Dispense: 60 tablet; Refill: 0 - Ambulatory referral  to Ophthalmology - blood glucose meter kit and supplies KIT; Dispense based on patient and insurance preference. Use up to four times daily as directed. (FOR ICD-9 250.00, 250.01).  Dispense: 1 each; Refill: 0  3. Encounter to establish care -Routine labs will be checked. - Comp Met (CMET); Future - CBC w/Diff; Future - TSH; Future - Lipid panel; Future - Urinalysis; Future - HgB A1c; Future  4. Chronic  hand pain, right - He will continue on Tylenol 650 mg as needed and to notify clinic for worsening symptoms. - acetaminophen (TYLENOL) 325 MG tablet; Take 2 tablets (650 mg total) by mouth 2 (two) times daily.  Dispense: 30 tablet; Refill: 0   Follow up: 09/06/2020 if symptoms fail to improve or worsen  Yvon Mccord E Phuoc Huy 08/01/2020, 3:20 PM

## 2020-08-01 NOTE — Patient Instructions (Signed)
Carbohydrate Counting for Diabetes Mellitus, Adult  Carbohydrate counting is a method of keeping track of how many carbohydrates you eat. Eating carbohydrates naturally increases the amount of sugar (glucose) in the blood. Counting how many carbohydrates you eat helps keep your blood glucose within normal limits, which helps you manage your diabetes (diabetes mellitus). It is important to know how many carbohydrates you can safely have in each meal. This is different for every person. A diet and nutrition specialist (registered dietitian) can help you make a meal plan and calculate how many carbohydrates you should have at each meal and snack. Carbohydrates are found in the following foods:  Grains, such as breads and cereals.  Dried beans and soy products.  Starchy vegetables, such as potatoes, peas, and corn.  Fruit and fruit juices.  Milk and yogurt.  Sweets and snack foods, such as cake, cookies, candy, chips, and soft drinks. How do I count carbohydrates? There are two ways to count carbohydrates in food. You can use either of the methods or a combination of both. Reading "Nutrition Facts" on packaged food The "Nutrition Facts" list is included on the labels of almost all packaged foods and beverages in the [place] It includes:  The serving size.  Information about nutrients in each serving, including the grams (g) of carbohydrate per serving. To use the "Nutrition Facts":  Decide how many servings you will have.  Multiply the number of servings by the number of carbohydrates per serving.  The resulting number is the total amount of carbohydrates that you will be having. Learning standard serving sizes of other foods When you eat carbohydrate foods that are not packaged or do not include "Nutrition Facts" on the label, you need to measure the servings in order to count the amount of carbohydrates:  Measure the foods that you will eat with a food scale or measuring cup, if  needed.  Decide how many standard-size servings you will eat.  Multiply the number of servings by 15. Most carbohydrate-rich foods have about 15 g of carbohydrates per serving. ? For example, if you eat 8 oz (170 g) of strawberries, you will have eaten 2 servings and 30 g of carbohydrates (2 servings x 15 g = 30 g).  For foods that have more than one food mixed, such as soups and casseroles, you must count the carbohydrates in each food that is included. The following list contains standard serving sizes of common carbohydrate-rich foods. Each of these servings has about 15 g of carbohydrates:   hamburger bun or  English muffin.   oz (15 mL) syrup.   oz (14 g) jelly.  1 slice of bread.  1 six-inch tortilla.  3 oz (85 g) cooked rice or pasta.  4 oz (113 g) cooked dried beans.  4 oz (113 g) starchy vegetable, such as peas, corn, or potatoes.  4 oz (113 g) hot cereal.  4 oz (113 g) mashed potatoes or  of a large baked potato.  4 oz (113 g) canned or frozen fruit.  4 oz (120 mL) fruit juice.  4-6 crackers.  6 chicken nuggets.  6 oz (170 g) unsweetened dry cereal.  6 oz (170 g) plain fat-free yogurt or yogurt sweetened with artificial sweeteners.  8 oz (240 mL) milk.  8 oz (170 g) fresh fruit or one small piece of fruit.  24 oz (680 g) popped popcorn. Example of carbohydrate counting Sample meal  3 oz (85 g) chicken breast.  6 oz (170 g)   brown rice.  4 oz (113 g) corn.  8 oz (240 mL) milk.  8 oz (170 g) strawberries with sugar-free whipped topping. Carbohydrate calculation 1. Identify the foods that contain carbohydrates: ? Rice. ? Corn. ? Milk. ? Strawberries. 2. Calculate how many servings you have of each food: ? 2 servings rice. ? 1 serving corn. ? 1 serving milk. ? 1 serving strawberries. 3. Multiply each number of servings by 15 g: ? 2 servings rice x 15 g = 30 g. ? 1 serving corn x 15 g = 15 g. ? 1 serving milk x 15 g = 15 g. ? 1  serving strawberries x 15 g = 15 g. 4. Add together all of the amounts to find the total grams of carbohydrates eaten: ? 30 g + 15 g + 15 g + 15 g = 75 g of carbohydrates total. Summary  Carbohydrate counting is a method of keeping track of how many carbohydrates you eat.  Eating carbohydrates naturally increases the amount of sugar (glucose) in the blood.  Counting how many carbohydrates you eat helps keep your blood glucose within normal limits, which helps you manage your diabetes.  A diet and nutrition specialist (registered dietitian) can help you make a meal plan and calculate how many carbohydrates you should have at each meal and snack. This information is not intended to replace advice given to you by your health care provider. Make sure you discuss any questions you have with your health care provider. Document Revised: 03/06/2017 Document Reviewed: 01/24/2016 Elsevier Patient Education  2020 Elsevier Inc.  

## 2020-08-02 ENCOUNTER — Other Ambulatory Visit: Payer: Medicaid Other

## 2020-08-02 DIAGNOSIS — Z7689 Persons encountering health services in other specified circumstances: Secondary | ICD-10-CM

## 2020-08-03 LAB — CBC WITH DIFFERENTIAL/PLATELET
Basophils Absolute: 0.1 10*3/uL (ref 0.0–0.2)
Basos: 1 %
EOS (ABSOLUTE): 0.2 10*3/uL (ref 0.0–0.4)
Eos: 3 %
Hematocrit: 46.1 % (ref 37.5–51.0)
Hemoglobin: 15.6 g/dL (ref 13.0–17.7)
Immature Grans (Abs): 0 10*3/uL (ref 0.0–0.1)
Immature Granulocytes: 0 %
Lymphocytes Absolute: 2.7 10*3/uL (ref 0.7–3.1)
Lymphs: 33 %
MCH: 28.1 pg (ref 26.6–33.0)
MCHC: 33.8 g/dL (ref 31.5–35.7)
MCV: 83 fL (ref 79–97)
Monocytes Absolute: 0.7 10*3/uL (ref 0.1–0.9)
Monocytes: 8 %
Neutrophils Absolute: 4.5 10*3/uL (ref 1.4–7.0)
Neutrophils: 55 %
Platelets: 265 10*3/uL (ref 150–450)
RBC: 5.56 x10E6/uL (ref 4.14–5.80)
RDW: 12.6 % (ref 11.6–15.4)
WBC: 8.2 10*3/uL (ref 3.4–10.8)

## 2020-08-03 LAB — URINALYSIS
Bilirubin, UA: NEGATIVE
Leukocytes,UA: NEGATIVE
Nitrite, UA: NEGATIVE
Protein,UA: NEGATIVE
RBC, UA: NEGATIVE
Specific Gravity, UA: >=1.03 — AB (ref 1.005–1.030)
Urobilinogen, Ur: 0.2 mg/dL (ref 0.2–1.0)
pH, UA: 5.5 (ref 5.0–7.5)

## 2020-08-03 LAB — COMPREHENSIVE METABOLIC PANEL
ALT: 16 IU/L (ref 0–44)
AST: 13 IU/L (ref 0–40)
Albumin/Globulin Ratio: 1.7 (ref 1.2–2.2)
Albumin: 4.5 g/dL (ref 4.1–5.2)
Alkaline Phosphatase: 74 IU/L (ref 44–121)
BUN/Creatinine Ratio: 10 (ref 9–20)
BUN: 12 mg/dL (ref 6–20)
Bilirubin Total: <0.2 mg/dL (ref 0.0–1.2)
CO2: 23 mmol/L (ref 20–29)
Calcium: 9.7 mg/dL (ref 8.7–10.2)
Chloride: 105 mmol/L (ref 96–106)
Creatinine, Ser: 1.17 mg/dL (ref 0.76–1.27)
GFR calc Af Amer: 98 mL/min/{1.73_m2} (ref >59)
GFR calc non Af Amer: 84 mL/min/{1.73_m2} (ref >59)
Globulin, Total: 2.6 g/dL (ref 1.5–4.5)
Glucose: 161 mg/dL — ABNORMAL HIGH (ref 65–99)
Potassium: 4.6 mmol/L (ref 3.5–5.2)
Sodium: 141 mmol/L (ref 134–144)
Total Protein: 7.1 g/dL (ref 6.0–8.5)

## 2020-08-03 LAB — LIPID PANEL
Chol/HDL Ratio: 5.8 ratio — ABNORMAL HIGH (ref 0.0–5.0)
Cholesterol, Total: 184 mg/dL (ref 100–199)
HDL: 32 mg/dL — ABNORMAL LOW (ref >39)
LDL Chol Calc (NIH): 119 mg/dL — ABNORMAL HIGH (ref 0–99)
Triglycerides: 185 mg/dL — ABNORMAL HIGH (ref 0–149)
VLDL Cholesterol Cal: 33 mg/dL (ref 5–40)

## 2020-08-03 LAB — HEMOGLOBIN A1C
Est. average glucose Bld gHb Est-mCnc: 192 mg/dL
Hgb A1c MFr Bld: 8.3 % — ABNORMAL HIGH (ref 4.8–5.6)

## 2020-08-03 LAB — TSH: TSH: 1.01 u[IU]/mL (ref 0.450–4.500)

## 2020-08-04 NOTE — Progress Notes (Signed)
Completed [facility] application and contract.  Patient agreed to all terms of the [facility] contract.    Patient approved to receive medication assistance at [facility] until time for re-certification and as long as eligibility criteria continues to be met.    Provided patient with Civil engineer, contracting based on his particular needs.    [place] [facility]

## 2020-08-11 ENCOUNTER — Other Ambulatory Visit: Payer: Self-pay | Admitting: Gerontology

## 2020-08-11 DIAGNOSIS — E119 Type 2 diabetes mellitus without complications: Secondary | ICD-10-CM

## 2020-09-06 ENCOUNTER — Ambulatory Visit: Payer: Medicaid Other | Admitting: Gerontology

## 2020-09-06 ENCOUNTER — Other Ambulatory Visit: Payer: Self-pay | Admitting: Gerontology

## 2020-09-06 DIAGNOSIS — E119 Type 2 diabetes mellitus without complications: Secondary | ICD-10-CM

## 2020-09-06 MED ORDER — METFORMIN HCL 500 MG PO TABS: 500.0000 mg | ORAL_TABLET | Freq: Two times a day (BID) | ORAL | 0 refills | Status: DC

## 2020-09-07 ENCOUNTER — Other Ambulatory Visit: Payer: Self-pay | Admitting: Gerontology

## 2020-09-07 DIAGNOSIS — E119 Type 2 diabetes mellitus without complications: Secondary | ICD-10-CM

## 2020-09-21 ENCOUNTER — Other Ambulatory Visit: Payer: Self-pay

## 2020-09-21 ENCOUNTER — Ambulatory Visit: Payer: Medicaid Other | Admitting: Gerontology

## 2020-09-21 DIAGNOSIS — E785 Hyperlipidemia, unspecified: Secondary | ICD-10-CM

## 2020-09-21 DIAGNOSIS — E119 Type 2 diabetes mellitus without complications: Secondary | ICD-10-CM

## 2020-09-21 MED ORDER — METFORMIN HCL 1000 MG PO TABS: 1000.0000 mg | ORAL_TABLET | Freq: Two times a day (BID) | ORAL | 0 refills | Status: DC

## 2020-09-21 NOTE — Progress Notes (Signed)
Established Patient Office Visit  Subjective:  Patient ID: Stuart Rivers, male    DOB: Apr 26, 1991  Age: 30 y.o. MRN: [id number]  CC: No chief complaint on file. Patient consents to telephone visit and 2 patient identifiers was used to identify patient.  HPI Stuart Rivers who has a history of Anxiety, Depression and Type 2 diabetes presents for follow up of type 2 diabtes mellitus, and lab review. His HgbA1c done on 08/02/20 was 8.3%. He sates that he checks his blood glucose bid, and his fasting readings are usually between 230-240 mg/dl and the evening readings are between 160-170 mg/dl. He reports that his fasting reading today was 185 mg/dl. He denies hypoglycemic symptoms, but admits to experiencing Polyuria, Polydipsia and Polyphagia. He states that he has no control over what he eats, because he resides at [facility] and they mainly serve Carbohydrate. His LDL was 119 mg/dl, HDL 32 mg/dl and Triglyceride 185 mg/dl.  He states that his mood is good, denies suicidal nor homicidal ideation and continues to follow up at [facility] for his mental care. Overall, he states that he's doing well and offers no further complaint.  Past Medical History:  Diagnosis Date  . Anxiety   . Depression   . No pertinent past medical history     Past Surgical History:  Procedure Laterality Date  . CLOSED REDUCTION NASAL FRACTURE  04/07/2012   Procedure: CLOSED REDUCTION NASAL FRACTURE;  Surgeon: Theodoro Kos, DO;  Location: [place];  Service: Plastics;  Laterality: N/A;  . NO PAST SURGERIES    . ORIF TIBIA FRACTURE  04/05/2012   Procedure: OPEN REDUCTION INTERNAL FIXATION (ORIF) TIBIA FRACTURE;  Surgeon: Mauri Pole, MD;  Location: [place];  Service: Orthopedics;  Laterality: Left;    Family History  Problem Relation Age of Onset  . Arthritis Other        grandparent  . Breast cancer Other   . Heart disease Other        grandparent  . Hypertension Other        grandparent   . Diabetes Other        parent  . Diabetes Other        grandparent    Social History   Socioeconomic History  . Marital status: Single    Spouse name: Not on file  . Number of children: Not on file  . Years of education: Not on file  . Highest education level: Not on file  Occupational History  . Not on file  Tobacco Use  . Smoking status: Former Smoker    Packs/day: 1.00    Types: Cigars, Cigarettes    Quit date: 05/26/2020    Years since quitting: 0.3  . Smokeless tobacco: Never Used  . Tobacco comment: few cigarettes a day  Vaping Use  . Vaping Use: Every day  . Substances: Nicotine  Substance and Sexual Activity  . Alcohol use: Not Currently  . Drug use: Not on file    Comment: no drugs since 04/16/2020  . Sexual activity: Not Currently  Other Topics Concern  . 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 lamoTRIgine (LAMICTAL) 25 MG tablet Take 25 mg by mouth daily. Pt to taper up on the dose    . prazosin (MINIPRESS) 2 MG capsule Take 1 capsule (2 mg total) by mouth at bedtime. 30 capsule 2  . metFORMIN (GLUCOPHAGE) 500 MG tablet TAKE ONE TABLET BY MOUTH 2TIMES A DAY WITH A MEAL. 60 tablet 0  . acetaminophen (TYLENOL) 325 MG tablet Take 2 tablets (650 mg total) by mouth 2 (two) times daily. 30 tablet 0  . blood glucose meter kit and supplies KIT Dispense based on patient and insurance preference. Use up to four times daily as  directed. (FOR ICD-9 250.00, 250.01). 1 each 0   No facility-administered medications prior to visit.    Allergies  Allergen Reactions  . Other     latex    ROS Review of Systems  Constitutional: Negative.   Respiratory: Negative.   Cardiovascular: Negative.   Endocrine: Positive for polydipsia, polyphagia and polyuria.  Skin: Negative.   Neurological: Negative.   Psychiatric/Behavioral: Negative.       Objective:    Physical Exam No physical exam was done There were no vitals taken for this visit. Wt Readings from Last 3 Encounters:  02/26/19 250 lb (113.4 kg)  04/30/17 215 lb (97.5 kg)  03/21/17 213 lb (96.6 kg)     Health Maintenance Due  Topic Date Due  . Hepatitis C Screening  Never done  . COVID-19 Vaccine (1) Never done  . FOOT EXAM  Never done  . OPHTHALMOLOGY EXAM  Never done  . INFLUENZA VACCINE  03/26/2020    There are no preventive care reminders to display for this patient.  Lab Results  Component Value Date   TSH 1.010 08/02/2020   Lab Results  Component Value Date   WBC 8.2 08/02/2020   HGB 15.6 08/02/2020   HCT 46.1 08/02/2020   MCV 83 08/02/2020   PLT 265 08/02/2020   Lab Results  Component Value Date   NA 141 08/02/2020   K 4.6 08/02/2020   CO2 23 08/02/2020   GLUCOSE 161 (H) 08/02/2020   BUN 12 08/02/2020   CREATININE 1.17 08/02/2020   BILITOT <0.2 08/02/2020   ALKPHOS 74 08/02/2020   AST 13 08/02/2020   ALT 16 08/02/2020   PROT 7.1 08/02/2020   ALBUMIN 4.5 08/02/2020   CALCIUM 9.7 08/02/2020   ANIONGAP 11 02/26/2019   Lab Results  Component Value Date   CHOL 184 08/02/2020   Lab Results  Component Value Date   HDL 32 (L) 08/02/2020   Lab Results  Component Value Date   LDLCALC 119 (H) 08/02/2020   Lab Results  Component Value Date   TRIG 185 (H) 08/02/2020   Lab Results  Component Value Date   CHOLHDL 5.8 (H) 08/02/2020   Lab Results  Component Value Date   HGBA1C 8.3 (H) 08/02/2020       Assessment & Plan:   1. Type 2 diabetes mellitus without complication, without long-term current use of insulin (HCC) - His diabetes is not controlled, his HgbA1c was 8.3% and his goal should be less than 7%. His Metformin was increased to 1000 mg bid,  was advised to check blood glucose bid, record and bring log to follow up visit. His fasting blood glucose goal should be between 80-130 mg/dl. He was advised to continue on low carb/ non concentrated sweet diet and exercise as tolerated. - metFORMIN (GLUCOPHAGE) 1000 MG tablet; Take 1 tablet (1,000 mg total) by mouth 2 (two) times daily with a meal.  Dispense: 60 tablet; Refill: 0 - HgB A1c; Future - Ambulatory referral to Ophthalmology    2. Elevated lipids - His LDL was 119 mg/dl, he was encouraged to continue on low fat/cholesterol diet and exercise as tolerated.    Follow-up: Return in about 7 weeks (around 11/08/2020), or if symptoms worsen or fail to improve.     E , NP 

## 2020-09-21 NOTE — Patient Instructions (Signed)

## 2020-09-23 DIAGNOSIS — E785 Hyperlipidemia, unspecified: Secondary | ICD-10-CM | POA: Insufficient documentation

## 2020-10-10 ENCOUNTER — Other Ambulatory Visit: Payer: Self-pay

## 2020-10-11 ENCOUNTER — Telehealth: Payer: Self-pay | Admitting: Pharmacy Technician

## 2020-10-11 NOTE — Telephone Encounter (Signed)
Received updated proof of income.  Patient eligible to receive [facility] at [facility] until time for re-certification in 1610, and as long as eligibility requirements continue to be met.  [place] [facility]

## 2020-10-24 ENCOUNTER — Other Ambulatory Visit: Payer: Self-pay

## 2020-10-24 ENCOUNTER — Ambulatory Visit: Payer: Self-pay | Admitting: Family Medicine

## 2020-10-24 ENCOUNTER — Encounter: Payer: Self-pay | Admitting: Family Medicine

## 2020-10-24 DIAGNOSIS — Z113 Encounter for screening for infections with a predominantly sexual mode of transmission: Secondary | ICD-10-CM

## 2020-10-24 DIAGNOSIS — Z792 Long term (current) use of antibiotics: Secondary | ICD-10-CM

## 2020-10-24 LAB — GRAM STAIN

## 2020-10-24 MED ORDER — METRONIDAZOLE 500 MG PO TABS: 2000.0000 mg | ORAL_TABLET | Freq: Once | ORAL | 0 refills | Status: AC

## 2020-10-24 MED ORDER — AZITHROMYCIN 500 MG PO TABS
1000.0000 mg | ORAL_TABLET | Freq: Once | ORAL | Status: AC
  Administered 2020-10-24: 1000 mg via ORAL

## 2020-10-24 MED ORDER — CEFTRIAXONE SODIUM 500 MG IJ SOLR
500.0000 mg | Freq: Once | INTRAMUSCULAR | Status: AC
  Administered 2020-10-24: 500 mg via INTRAMUSCULAR

## 2020-10-24 NOTE — Progress Notes (Signed)
Gram stain reviewed - negative. Treated presumptively per Elveria Rising FNP-BC. Jossie Ng, RN
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
## 2020-10-29 LAB — GONOCOCCUS CULTURE

## 2020-10-30 ENCOUNTER — Other Ambulatory Visit: Payer: Self-pay

## 2020-10-31 ENCOUNTER — Other Ambulatory Visit: Payer: Self-pay | Admitting: Gerontology

## 2020-10-31 DIAGNOSIS — E119 Type 2 diabetes mellitus without complications: Secondary | ICD-10-CM

## 2020-10-31 MED ORDER — METFORMIN HCL 1000 MG PO TABS
1000.0000 mg | ORAL_TABLET | Freq: Two times a day (BID) | ORAL | 0 refills | Status: DC
Start: 2020-10-31 — End: 2020-11-08

## 2020-11-01 ENCOUNTER — Other Ambulatory Visit: Payer: Medicaid Other

## 2020-11-01 ENCOUNTER — Other Ambulatory Visit: Payer: Self-pay

## 2020-11-01 VITALS — BP 113/73 | HR 82 | Temp 97.7°F | Ht 66.0 in | Wt 277.0 lb

## 2020-11-01 DIAGNOSIS — E119 Type 2 diabetes mellitus without complications: Secondary | ICD-10-CM

## 2020-11-01 NOTE — Addendum Note (Signed)
Addended by: Wendi Snipes on: 11/01/2020 02:23 PM   Modules accepted: Orders

## 2020-11-02 LAB — HEPB+HEPC+HIV PANEL
HIV Screen 4th Generation wRfx: NONREACTIVE
Hep B C IgM: NEGATIVE
Hep B Core Total Ab: NEGATIVE
Hep B E Ab: NEGATIVE
Hep B E Ag: NEGATIVE
Hep B Surface Ab, Qual: NONREACTIVE
Hep C Virus Ab: <0.1 s/co ratio (ref 0.0–0.9)
Hepatitis B Surface Ag: NEGATIVE

## 2020-11-02 LAB — HEMOGLOBIN A1C
Est. average glucose Bld gHb Est-mCnc: 217 mg/dL
Hgb A1c MFr Bld: 9.2 % — ABNORMAL HIGH (ref 4.8–5.6)

## 2020-11-02 LAB — RPR: RPR Ser Ql: NONREACTIVE

## 2020-11-08 ENCOUNTER — Ambulatory Visit: Payer: Medicaid Other | Admitting: Gerontology

## 2020-11-08 ENCOUNTER — Other Ambulatory Visit: Payer: Self-pay

## 2020-11-08 ENCOUNTER — Other Ambulatory Visit: Payer: Self-pay | Admitting: Gerontology

## 2020-11-08 VITALS — BP 111/70 | HR 78 | Temp 98.6°F | Resp 16 | Wt 274.3 lb

## 2020-11-08 DIAGNOSIS — E119 Type 2 diabetes mellitus without complications: Secondary | ICD-10-CM

## 2020-11-08 MED ORDER — GLIPIZIDE 5 MG PO TABS: 5.0000 mg | ORAL_TABLET | Freq: Every day | ORAL | 0 refills | Status: DC

## 2020-11-08 MED ORDER — METFORMIN HCL 1000 MG PO TABS: 1000.0000 mg | ORAL_TABLET | Freq: Two times a day (BID) | ORAL | 2 refills | Status: DC

## 2020-11-08 NOTE — Progress Notes (Signed)
Established Patient Office Visit  Subjective:  Patient ID: Stuart Rivers, male    DOB: 23-Jan-1991  Age: 30 y.o. MRN: [id number]  CC: No chief complaint on file.   HPI Stuart Rivers  who has a history of Anxiety, Depression and Type 2 diabetes presents for follow up of type 2 diabtes mellitus, and lab review. His HgbA 1c done on 11/01/20 increased from 8.3 % to 9.2%. He states that he's compliant with his medications and continues to make healthy life style changes. He continues to reside at [facility] for rehabilitation. He checks his blood glucose bid, his fasting blood glucose readings ranges between 125 mg/dl- 148 mg/l and his evening readings ranges between 148 mg/dl- 194 mg/dl. He denies hypo/hyperglycemic symptoms and peripheral neuropathy and performs daily foot exams. He is yet to schedule an Eye exam. Overall, he states that he's doing well and offers no further complaint.  Past Medical History:  Diagnosis Date  . Anxiety   . Depression   . No pertinent past medical history     Past Surgical History:  Procedure Laterality Date  . CLOSED REDUCTION NASAL FRACTURE  04/07/2012   Procedure: CLOSED REDUCTION NASAL FRACTURE;  Surgeon: Theodoro Kos, DO;  Location: [place];  Service: Plastics;  Laterality: N/A;  . NO PAST SURGERIES    . ORIF TIBIA FRACTURE  04/05/2012   Procedure: OPEN REDUCTION INTERNAL FIXATION (ORIF) TIBIA FRACTURE;  Surgeon: Stuart Pole, MD;  Location: [place];  Service: Orthopedics;  Laterality: Left;    Family History  Problem Relation Age of Onset  . Arthritis Other        grandparent  . Breast cancer Other   . Heart disease Other        grandparent  . Hypertension Other        grandparent  . Diabetes Other        parent  . Diabetes Other        grandparent    Social History   Socioeconomic History  . Marital status: Single    Spouse name: Not on file  . Number of children: Not on file  . Years of education: Not on file  . Highest  education level: Not on file  Occupational History  . Not on file  Tobacco Use  . Smoking status: Former Smoker    Packs/day: 1.00    Types: Cigars, Cigarettes    Quit date: 05/26/2020    Years since quitting: 0.4  . Smokeless tobacco: Never Used  . Tobacco comment: few cigarettes a day  Vaping Use  . Vaping Use: Every day  . Last attempt to quit: 09/18/2020  . Substances: Nicotine  Substance and Sexual Activity  . Alcohol use: Not Currently  . Drug use: Not Currently    Types: Cocaine    Comment: no drugs since 04/16/2020  . Sexual activity: Not Currently  Other Topics Concern  . 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 Fear of Current or Ex-Partner: No  . Emotionally Abused: No  . Physically Abused: No  . Sexually Abused: No    Outpatient Medications Prior to Visit  Medication Sig Dispense Refill  . acetaminophen (TYLENOL) 325 MG tablet Take 2 tablets (650 mg total) by mouth 2 (two) times daily. 30 tablet 0  . lamoTRIgine (LAMICTAL) 25 MG tablet Take 25 mg by mouth daily. Pt to taper up on the dose    . metFORMIN (GLUCOPHAGE) 1000 MG tablet Take 1 tablet (1,000 mg total) by mouth 2 (two) times daily with a meal. 60 tablet 0  . blood glucose meter kit and supplies KIT Dispense based on patient and insurance preference. Use up to four times daily as directed. (FOR ICD-9 250.00, 250.01). 1 each 0  . prazosin (MINIPRESS) 2 MG capsule Take 1 capsule (2 mg total) by mouth at bedtime. (Patient not taking: Reported on  11/08/2020) 30 capsule 2   No facility-administered medications prior to visit.    Allergies  Allergen Reactions  . Other Hives    latex    ROS Review of Systems  Constitutional: Negative.   Eyes: Negative.   Respiratory: Negative.   Cardiovascular: Negative.   Endocrine: Negative.   Neurological: Negative.   Psychiatric/Behavioral: Negative.       Objective:    Physical Exam HENT:     Head: Normocephalic and atraumatic.  Eyes:     Extraocular Movements: Extraocular movements intact.     Conjunctiva/sclera: Conjunctivae normal.     Pupils: Pupils are equal, round, and reactive to light.  Cardiovascular:     Rate and Rhythm: Normal rate and regular rhythm.     Pulses: Normal pulses.     Heart sounds: Normal heart sounds.  Pulmonary:     Effort: Pulmonary effort is normal.     Breath sounds: Normal breath sounds.  Neurological:     General: No focal deficit present.     Mental Status: He is alert and oriented to person, place, and time. Mental status is at baseline.  Psychiatric:        Mood and Affect: Mood normal.        Behavior: Behavior normal.        Thought Content: Thought content normal.        Judgment: Judgment normal.     BP 111/70 (BP Location: Right Arm, Patient Position: Sitting, Cuff Size: Large)   Pulse 78   Temp 98.6 F (37 C)   Resp 16   Wt 274 lb 4.8 oz (124.4 kg)   SpO2 98%   BMI 44.27 kg/m  Wt Readings from Last 3 Encounters:  11/08/20 274 lb 4.8 oz (124.4 kg)  11/01/20 277 lb (125.6 kg)  02/26/19 250 lb (113.4 kg)   Encouraged weight loss  Health Maintenance Due  Topic Date Due  . COVID-19 Vaccine (1) Never done  . FOOT EXAM  Never done  . OPHTHALMOLOGY EXAM  Never done  . INFLUENZA VACCINE  03/26/2020    There are no preventive care reminders to display for this patient.  Lab Results  Component Value Date   TSH 1.010 08/02/2020   Lab Results  Component Value Date   WBC 8.2 08/02/2020   HGB 15.6 08/02/2020   HCT  46.1 08/02/2020   MCV 83 08/02/2020   PLT 265 08/02/2020   Lab Results  Component Value Date   NA 141 08/02/2020  K 4.6 08/02/2020   CO2 23 08/02/2020   GLUCOSE 161 (H) 08/02/2020   BUN 12 08/02/2020   CREATININE 1.17 08/02/2020   BILITOT <0.2 08/02/2020   ALKPHOS 74 08/02/2020   AST 13 08/02/2020   ALT 16 08/02/2020   PROT 7.1 08/02/2020   ALBUMIN 4.5 08/02/2020   CALCIUM 9.7 08/02/2020   ANIONGAP 11 02/26/2019   Lab Results  Component Value Date   CHOL 184 08/02/2020   Lab Results  Component Value Date   HDL 32 (L) 08/02/2020   Lab Results  Component Value Date   LDLCALC 119 (H) 08/02/2020   Lab Results  Component Value Date   TRIG 185 (H) 08/02/2020   Lab Results  Component Value Date   CHOLHDL 5.8 (H) 08/02/2020   Lab Results  Component Value Date   HGBA1C 9.2 (H) 11/01/2020      Assessment & Plan:     1. Type 2 diabetes mellitus without complication, without long-term current use of insulin (HCC) - His HgbA1c was 9.2%, his goal should be less than 7%. His Diabetes not under control. He will continue on Metformin, Glipizide 5 mg was added, educated on medication side effects and advised to notify clinic. He was encouraged to check his blood glucose bid, record and bring log to follow up appointment. His fasting blood glucose goal should be between 80-130 mg/dl. He was encouraged to continue on low carb/non concentrated sweet diet and exercise as tolerated. - glipiZIDE (GLUCOTROL) 5 MG tablet; Take 1 tablet (5 mg total) by mouth daily before breakfast.  Dispense: 30 tablet; Refill: 0 - metFORMIN (GLUCOPHAGE) 1000 MG tablet; Take 1 tablet (1,000 mg total) by mouth 2 (two) times daily with a meal.  Dispense: 60 tablet; Refill: 2   Follow-up: Return in about 29 days (around 12/07/2020), or if symptoms worsen or fail to improve.    Krzysztof Reichelt Jerold Coombe, NP

## 2020-11-08 NOTE — Patient Instructions (Signed)

## 2020-11-29 ENCOUNTER — Other Ambulatory Visit: Payer: Self-pay | Admitting: Gerontology

## 2020-11-29 ENCOUNTER — Other Ambulatory Visit: Payer: Self-pay

## 2020-11-29 DIAGNOSIS — E119 Type 2 diabetes mellitus without complications: Secondary | ICD-10-CM

## 2020-11-29 MED ORDER — GLIPIZIDE 5 MG PO TABS
5.0000 mg | ORAL_TABLET | Freq: Every day | ORAL | 0 refills | Status: DC
  Filled 2020-11-29: qty 30, 30d supply, fill #0

## 2020-11-29 MED FILL — Lamotrigine Tab 25 MG: ORAL | 30 days supply | Qty: 120 | Fill #0 | Status: AC

## 2020-11-29 MED FILL — Metformin HCl Tab 500 MG: ORAL | 30 days supply | Qty: 60 | Fill #0 | Status: CN

## 2020-11-30 ENCOUNTER — Other Ambulatory Visit: Payer: Self-pay

## 2020-12-01 ENCOUNTER — Other Ambulatory Visit: Payer: Self-pay

## 2020-12-01 ENCOUNTER — Other Ambulatory Visit: Payer: Self-pay | Admitting: Gerontology

## 2020-12-01 MED FILL — Metformin HCl Tab 1000 MG: ORAL | 30 days supply | Qty: 60 | Fill #0 | Status: AC

## 2020-12-04 ENCOUNTER — Other Ambulatory Visit: Payer: Self-pay | Admitting: Gerontology

## 2020-12-04 ENCOUNTER — Other Ambulatory Visit: Payer: Self-pay

## 2020-12-07 ENCOUNTER — Other Ambulatory Visit: Payer: Self-pay

## 2020-12-07 ENCOUNTER — Encounter: Payer: Self-pay | Admitting: Gerontology

## 2020-12-07 ENCOUNTER — Ambulatory Visit: Payer: Medicaid Other | Admitting: Gerontology

## 2020-12-07 VITALS — BP 136/84 | HR 90 | Ht 66.0 in | Wt 267.7 lb

## 2020-12-07 DIAGNOSIS — M25551 Pain in right hip: Secondary | ICD-10-CM

## 2020-12-07 DIAGNOSIS — Z113 Encounter for screening for infections with a predominantly sexual mode of transmission: Secondary | ICD-10-CM | POA: Insufficient documentation

## 2020-12-07 DIAGNOSIS — E119 Type 2 diabetes mellitus without complications: Secondary | ICD-10-CM

## 2020-12-07 DIAGNOSIS — G8929 Other chronic pain: Secondary | ICD-10-CM | POA: Insufficient documentation

## 2020-12-07 LAB — GLUCOSE, POCT (MANUAL RESULT ENTRY): POC Glucose: 76 mg/dl (ref 70–99)

## 2020-12-07 MED ORDER — GLIPIZIDE 5 MG PO TABS
5.0000 mg | ORAL_TABLET | Freq: Every day | ORAL | 2 refills | Status: DC
  Filled 2020-12-07 – 2021-01-03 (×4): qty 30, 30d supply, fill #0
  Filled 2021-01-29: qty 30, 30d supply, fill #1

## 2020-12-07 NOTE — Progress Notes (Signed)
Established Patient Office Visit  Subjective:  Patient ID: Stuart Rivers, male    DOB: Dec 29, 1990  Age: 30 y.o. MRN: [id number]  CC:  Chief Complaint  Patient presents with  . Follow-up    Fu visit    HPI Stuart Rivers presents for who has a history of Anxiety, Depression and Type 2 diabetespresents for follow up of type 2 diabtes mellitus. His HgbA 1c done on 11/01/20 increased from 8.3 % to 9.2%. He states that he's compliant with his medications and continues to make healthy life style changes. He was started on 5 mg Glipizide daily, states that he tolerates medication. He checks his blood glucose tid, his fasting blood glucose readings ranges between 125 mg/dl- 135 mg/l and his evening readings ranges between 95 mg/dl- 110 mg/dl. He denies hypo/hyperglycemic symptoms and peripheral neuropathy and performs daily foot exams . His blood glucose was 76 mg/dl during visit. He continues to reside at [facility] for rehabilitation. Currently, he c/o non traumatic constant strong dull 8/10 pain to his right hip. He states that right hip pain started one year ago in June of 2021. He states that bending, changing from a sitting to standing position aggravates pain. He states that applying analgesic cream and Ibuprofen minimally relieve symptoms. He denies muscle and motor weakness. He states that he was treated for STI on 10/24/20 and he continues tomexperience penile discomfort " itching inside". He denies penile pain, erythema, dysuria, urinary frequency nor urgency and discharge. He is not sexually active. Overall, he states that he's doing well and offers no further complaint.  Past Medical History:  Diagnosis Date  . Anxiety   . Depression   . No pertinent past medical history     Past Surgical History:  Procedure Laterality Date  . CLOSED REDUCTION NASAL FRACTURE  04/07/2012   Procedure: CLOSED REDUCTION NASAL FRACTURE;  Surgeon: Theodoro Kos, DO;  Location: [facility];  Service:  Plastics;  Laterality: N/A;  . NO PAST SURGERIES    . ORIF TIBIA FRACTURE  04/05/2012   Procedure: OPEN REDUCTION INTERNAL FIXATION (ORIF) TIBIA FRACTURE;  Surgeon: Mauri Pole, MD;  Location: [facility];  Service: Orthopedics;  Laterality: Left;    Family History  Problem Relation Age of Onset  . Arthritis Other        grandparent  . Breast cancer Other   . Heart disease Other        grandparent  . Hypertension Other        grandparent  . Diabetes Other        parent  . Diabetes Other        grandparent    Social History   Socioeconomic History  . Marital status: Single    Spouse name: Not on file  . Number of children: Not on file  . Years of education: Not on file  . Highest education level: Not on file  Occupational History  . Not on file  Tobacco Use  . Smoking status: Former Smoker    Packs/day: 1.00    Types: Cigars, Cigarettes    Quit date: 05/26/2020    Years since quitting: 0.5  . Smokeless tobacco: Never Used  . Tobacco comment: few cigarettes a day  Vaping Use  . Vaping Use: Every day  . Last attempt to quit: 09/18/2020  . Substances: Nicotine  Substance and Sexual Activity  . Alcohol use: Not Currently  . Drug use: Not Currently    Types: Cocaine  Comment: no drugs since 04/16/2020  . Sexual activity: Not Currently  Other Topics Concern  . 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 Fear of Current or Ex-Partner: No  . Emotionally Abused: No  . Physically Abused: No  . Sexually Abused:  No    Outpatient Medications Prior to Visit  Medication Sig Dispense Refill  . acetaminophen (TYLENOL) 325 MG tablet Take 2 tablets (650 mg total) by mouth 2 (two) times daily. 30 tablet 0  . blood glucose meter kit and supplies KIT Dispense based on patient and insurance preference. Use up to four times daily as directed. (FOR ICD-9 250.00, 250.01). 1 each 0  . lamoTRIgine (LAMICTAL) 25 MG tablet Take 25 mg by mouth daily. Pt to taper up on the dose    . metFORMIN (GLUCOPHAGE) 1000 MG tablet Take 1 tablet (1,000 mg total) by mouth 2 (two) times daily with a meal. 60 tablet 2  . glipiZIDE (GLUCOTROL) 5 MG tablet Take 1 tablet (5 mg total) by mouth daily before breakfast. 30 tablet 0   No facility-administered medications prior to visit.    Allergies  Allergen Reactions  . Other Hives    latex    ROS Review of Systems  Constitutional: Negative.   Respiratory: Negative.   Cardiovascular: Negative.   Gastrointestinal: Negative.   Genitourinary: Negative for dysuria, frequency, penile discharge, penile pain, penile swelling, scrotal swelling and urgency.       Itching inside his penis  Musculoskeletal: Positive for arthralgias (chronic right hip pain).  Psychiatric/Behavioral: Negative.       Objective:    Physical Exam HENT:     Head: Normocephalic and atraumatic.  Cardiovascular:     Rate and Rhythm: Normal rate and regular rhythm.     Pulses: Normal pulses.     Heart sounds: Normal heart sounds.  Pulmonary:     Effort: Pulmonary effort is normal.     Breath sounds: Normal breath sounds.  Abdominal:     Palpations: Abdomen is soft.     Tenderness: There is no right CVA tenderness or left CVA tenderness.  Genitourinary:    Comments: Deferred per patient. Musculoskeletal:        General: Tenderness (Tenderness to right hip with bending) present.  Neurological:     General: No focal deficit present.     Mental Status: He is alert and oriented to person, place, and  time. Mental status is at baseline.  Psychiatric:        Mood and Affect: Mood normal.        Behavior: Behavior normal.        Thought Content: Thought content normal.        Judgment: Judgment normal.     BP 136/84 (BP Location: Left Arm, Patient Position: Sitting)   Pulse 90   Ht 5' 6"  (1.676 m)   Wt 267 lb 11.2 oz (121.4 kg)  SpO2 97%   BMI 43.21 kg/m  Wt Readings from Last 3 Encounters:  12/07/20 267 lb 11.2 oz (121.4 kg)  11/08/20 274 lb 4.8 oz (124.4 kg)  11/01/20 277 lb (125.6 kg)   Encouraged weight loss  Health Maintenance Due  Topic Date Due  . COVID-19 Vaccine (1) Never done  . FOOT EXAM  Never done  . OPHTHALMOLOGY EXAM  Never done  . URINE MICROALBUMIN  Never done    There are no preventive care reminders to display for this patient.  Lab Results  Component Value Date   TSH 1.010 08/02/2020   Lab Results  Component Value Date   WBC 8.2 08/02/2020   HGB 15.6 08/02/2020   HCT 46.1 08/02/2020   MCV 83 08/02/2020   PLT 265 08/02/2020   Lab Results  Component Value Date   NA 141 08/02/2020   K 4.6 08/02/2020   CO2 23 08/02/2020   GLUCOSE 161 (H) 08/02/2020   BUN 12 08/02/2020   CREATININE 1.17 08/02/2020   BILITOT <0.2 08/02/2020   ALKPHOS 74 08/02/2020   AST 13 08/02/2020   ALT 16 08/02/2020   PROT 7.1 08/02/2020   ALBUMIN 4.5 08/02/2020   CALCIUM 9.7 08/02/2020   ANIONGAP 11 02/26/2019   Lab Results  Component Value Date   CHOL 184 08/02/2020   Lab Results  Component Value Date   HDL 32 (L) 08/02/2020   Lab Results  Component Value Date   LDLCALC 119 (H) 08/02/2020   Lab Results  Component Value Date   TRIG 185 (H) 08/02/2020   Lab Results  Component Value Date   CHOLHDL 5.8 (H) 08/02/2020   Lab Results  Component Value Date   HGBA1C 9.2 (H) 11/01/2020      Assessment & Plan:     1. Type 2 diabetes mellitus without complication, without long-term current use of insulin (HCC) - His blood glucose readings are  improving, he will continue on current treatment regimen, low carb/ non concentrated sweet diet and exercise as tolerated. - glipiZIDE (GLUCOTROL) 5 MG tablet; Take 1 tablet (5 mg total) by mouth daily before breakfast.  Dispense: 30 tablet; Refill: 2 - POCT Glucose (CBG); Future - HgB A1c; Future  2. Chronic right hip pain - He was advised to take Ibuprofen and Tylenol as needed and follow up with [facility] Orthopedic Surgeon Dr Vickki Hearing. He was advised to go to the ED for worsening symptoms.  3. Screening for STD (sexually transmitted disease) - Urine will be checked to rule out UTI. - UA/M w/rflx Culture, Routine; Future   Follow-up: Return in about 2 months (around 02/07/2021), or if symptoms worsen or fail to improve.    Alece Koppel Jerold Coombe, NP

## 2020-12-07 NOTE — Patient Instructions (Signed)

## 2020-12-07 NOTE — Addendum Note (Signed)
Addended by: Dustin Flock on: 12/07/2020 05:32 PM   Modules accepted: Orders

## 2020-12-08 ENCOUNTER — Other Ambulatory Visit: Payer: Self-pay

## 2020-12-08 LAB — UA/M W/RFLX CULTURE, ROUTINE
Bilirubin, UA: NEGATIVE
Glucose, UA: NEGATIVE
Leukocytes,UA: NEGATIVE
Nitrite, UA: NEGATIVE
Protein,UA: NEGATIVE
RBC, UA: NEGATIVE
Specific Gravity, UA: 1.025 (ref 1.005–1.030)
Urobilinogen, Ur: 0.2 mg/dL (ref 0.2–1.0)
pH, UA: 5.5 (ref 5.0–7.5)

## 2020-12-08 LAB — MICROSCOPIC EXAMINATION
Bacteria, UA: NONE SEEN
Casts: NONE SEEN /lpf
Epithelial Cells (non renal): NONE SEEN /hpf (ref 0–10)
RBC, Urine: NONE SEEN /hpf (ref 0–2)

## 2020-12-09 ENCOUNTER — Other Ambulatory Visit: Payer: Self-pay | Admitting: Gerontology

## 2020-12-11 ENCOUNTER — Other Ambulatory Visit: Payer: Self-pay

## 2020-12-11 MED ORDER — LAMOTRIGINE 25 MG PO TABS
ORAL_TABLET | ORAL | 2 refills | Status: DC
  Filled 2020-12-11: qty 180, 32d supply, fill #0
  Filled 2021-01-29: qty 180, 30d supply, fill #1

## 2020-12-11 MED ORDER — GLIPIZIDE 5 MG PO TABS
ORAL_TABLET | ORAL | 0 refills | Status: DC
  Filled 2020-12-11: qty 30, 30d supply, fill #0

## 2020-12-18 ENCOUNTER — Other Ambulatory Visit: Payer: Self-pay

## 2021-01-01 ENCOUNTER — Other Ambulatory Visit: Payer: Self-pay

## 2021-01-01 MED FILL — Metformin HCl Tab 1000 MG: ORAL | 30 days supply | Qty: 60 | Fill #1 | Status: AC

## 2021-01-02 ENCOUNTER — Other Ambulatory Visit: Payer: Self-pay

## 2021-01-02 ENCOUNTER — Ambulatory Visit: Payer: Medicaid Other | Admitting: Specialist

## 2021-01-02 DIAGNOSIS — M25551 Pain in right hip: Secondary | ICD-10-CM

## 2021-01-02 DIAGNOSIS — G8929 Other chronic pain: Secondary | ICD-10-CM

## 2021-01-02 NOTE — Progress Notes (Signed)
HPI 30 y/o with a 1 yr history R hip pain w/o history of trauma. It is activity related, he works in maintenance. He's had no treatment for this.  PHYSICAL ASSESSMENT His gait he has a slight lurch to the R. He is able to heel/toe walk. He is able to stand on each leg independently with a neg. trendelenburg sign. ROM of his L/S spine is excellent. In the supine position he has a full ROM of R. Hip.  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 Would like to obtain an AP and lateral of the R hip. Return after he has obtained the imaging.

## 2021-01-03 ENCOUNTER — Other Ambulatory Visit: Payer: Self-pay

## 2021-01-08 ENCOUNTER — Other Ambulatory Visit: Payer: Self-pay

## 2021-01-08 MED ORDER — LAMOTRIGINE 100 MG PO TABS
ORAL_TABLET | ORAL | 1 refills | Status: DC
  Filled 2021-01-08: qty 14, 14d supply, fill #0
  Filled 2021-01-29: qty 60, 30d supply, fill #1
  Filled 2021-03-02 (×2): qty 30, 15d supply, fill #2

## 2021-01-08 MED ORDER — LAMOTRIGINE 25 MG PO TABS
ORAL_TABLET | ORAL | 0 refills | Status: DC
  Filled 2021-01-08: qty 42, 14d supply, fill #0

## 2021-01-09 ENCOUNTER — Other Ambulatory Visit: Payer: Self-pay

## 2021-01-11 ENCOUNTER — Other Ambulatory Visit: Payer: Self-pay

## 2021-01-12 ENCOUNTER — Other Ambulatory Visit: Payer: Self-pay

## 2021-01-16 ENCOUNTER — Other Ambulatory Visit
Admission: RE | Admit: 2021-01-16 | Discharge: 2021-01-16 | Disposition: A | Discharge status: Home / Self Care | Payer: Self-pay | Source: Home / Self Care | Attending: Gerontology | Admitting: Gerontology

## 2021-01-16 ENCOUNTER — Ambulatory Visit
Admission: RE | Admit: 2021-01-16 | Discharge: 2021-01-16 | Disposition: A | Discharge status: Home / Self Care | Payer: Self-pay | Source: Ambulatory Visit | Attending: Gerontology | Admitting: Gerontology

## 2021-01-16 ENCOUNTER — Other Ambulatory Visit: Payer: Self-pay

## 2021-01-16 ENCOUNTER — Ambulatory Visit: Payer: Medicaid Other | Admitting: Specialist

## 2021-01-16 DIAGNOSIS — G8929 Other chronic pain: Secondary | ICD-10-CM | POA: Insufficient documentation

## 2021-01-16 DIAGNOSIS — M25551 Pain in right hip: Secondary | ICD-10-CM | POA: Insufficient documentation

## 2021-01-16 NOTE — Progress Notes (Unsigned)
  Subjective:     Patient ID: Stuart Rivers, male   DOB: Jun 24, 1991, 30 y.o.   MRN: [id number]  HPI X rays were not done due to clerical problems. Stuart Rivers will discuss further with the hospital.    Review of Systems     Objective:   Physical Exam    Since there were no x rays, patient asked some general questions and Dr. Tretha Rivers answered.  Assessment:     ***    Plan:     Patient will get x rays and then schedule an appointment.

## 2021-01-29 ENCOUNTER — Other Ambulatory Visit: Payer: Self-pay

## 2021-01-29 MED FILL — Metformin HCl Tab 1000 MG: ORAL | 30 days supply | Qty: 60 | Fill #2 | Status: AC

## 2021-01-31 ENCOUNTER — Other Ambulatory Visit: Payer: Self-pay

## 2021-01-31 ENCOUNTER — Other Ambulatory Visit: Payer: Medicaid Other

## 2021-01-31 ENCOUNTER — Encounter: Payer: Self-pay | Admitting: Internal Medicine

## 2021-01-31 ENCOUNTER — Ambulatory Visit: Payer: Medicaid Other | Admitting: Internal Medicine

## 2021-01-31 DIAGNOSIS — R300 Dysuria: Secondary | ICD-10-CM

## 2021-01-31 DIAGNOSIS — E119 Type 2 diabetes mellitus without complications: Secondary | ICD-10-CM

## 2021-01-31 NOTE — Progress Notes (Signed)
Established Patient Office Visit  Subjective:  Patient ID: Stuart Rivers, male    DOB: December 21, 1990  Age: 30 y.o. MRN: [id number]  CC:  Chief Complaint  Patient presents with  . Dysuria  . Urinary Frequency    HPI Stuart Rivers presents for urination frequent and discomfort.   Past Medical History:  Diagnosis Date  . Anxiety   . Depression   . No pertinent past medical history     Past Surgical History:  Procedure Laterality Date  . CLOSED REDUCTION NASAL FRACTURE  04/07/2012   Procedure: CLOSED REDUCTION NASAL FRACTURE;  Surgeon: Theodoro Kos, DO;  Location: [facility];  Service: Plastics;  Laterality: N/A;  . NO PAST SURGERIES    . ORIF TIBIA FRACTURE  04/05/2012   Procedure: OPEN REDUCTION INTERNAL FIXATION (ORIF) TIBIA FRACTURE;  Surgeon: Mauri Pole, MD;  Location: [place];  Service: Orthopedics;  Laterality: Left;    Family History  Problem Relation Age of Onset  . Arthritis Other        grandparent  . Breast cancer Other   . Heart disease Other        grandparent  . Hypertension Other        grandparent  . Diabetes Other        parent  . Diabetes Other        grandparent    Social History   Socioeconomic History  . Marital status: Single    Spouse name: Not on file  . Number of children: Not on file  . Years of education: Not on file  . Highest education level: Not on file  Occupational History  . Not on file  Tobacco Use  . Smoking status: Former Smoker    Packs/day: 1.00    Types: Cigars, Cigarettes    Quit date: 05/26/2020    Years since quitting: 0.6  . Smokeless tobacco: Never Used  . Tobacco comment: few cigarettes a day  Vaping Use  . Vaping Use: Every day  . Last attempt to quit: 09/18/2020  . Substances: Nicotine  Substance and Sexual Activity  . Alcohol use: Not Currently  . Drug use: Not Currently    Types: Cocaine    Comment: no drugs since 04/16/2020  . Sexual activity: Not Currently  Other Topics Concern  . 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 Fear of Current or Ex-Partner: No  . Emotionally Abused: No  . Physically Abused: No  . Sexually Abused: No    Outpatient Medications Prior to Visit  Medication Sig Dispense Refill  . acetaminophen (TYLENOL) 325 MG tablet Take 2 tablets (650 mg total) by mouth 2 (two) times daily. 30 tablet 0  . blood glucose meter kit and supplies KIT Dispense based on patient and insurance preference. Use up to four times daily as directed. (FOR ICD-9 250.00, 250.01). 1 each 0  .  glipiZIDE (GLUCOTROL) 5 MG tablet Take 1 tablet (5 mg total) by mouth daily before breakfast. 30 tablet 2  . glipiZIDE (GLUCOTROL) 5 MG tablet TAKE ONE TABLET BY MOUTH EVERY DAY BEFORE BREAKFAST 30 tablet 0  . lamoTRIgine (LAMICTAL) 100 MG tablet Take one tablet (139m total) by mouth once daily every evening for 2 weeks (Take along with 75min the morning). After 2 weeks, take 10062my mouth twice daily for mood. STOP IMMEDIATELY SHOULD ANY RASH OCCUR. 60 tablet 1  . lamoTRIgine (LAMICTAL) 25 MG tablet Take 25 mg by mouth daily. Pt to taper up on the dose    . metFORMIN (GLUCOPHAGE) 1000 MG tablet Take 1 tablet (1,000 mg total) by mouth 2 (two) times daily with a meal. 60 tablet 2  . metFORMIN (GLUCOPHAGE) 1000 MG tablet TAKE ONE TABLET BY MOUTH 2 TIMES A DAY WITH A MEAL 60 tablet 2  . metFORMIN (GLUCOPHAGE) 500 MG tablet TAKE ONE TABLET BY MOUTH 2 TIMES A DAY WITH A MEAL. 60 tablet 1  . metFORMIN (GLUCOPHAGE) 500 MG tablet TAKE  ONE TABLET BY MOUTH 2 TIMES A DAY WITH A MEAL 60 tablet 0  . prazosin (MINIPRESS) 1 MG capsule TAKE 3 CAPSULES BY MOUTH AT BEDTIME 90 capsule 0   No facility-administered medications prior to visit.    Allergies  Allergen Reactions  . Other Hives    latex    ROS Review of Systems    Objective:    Physical Exam  There were no vitals taken for this visit. Wt Readings from Last 3 Encounters:  01/31/21 263 lb 11.2 oz (119.6 kg)  12/07/20 267 lb 11.2 oz (121.4 kg)  11/08/20 274 lb 4.8 oz (124.4 kg)     Health Maintenance Due  Topic Date Due  . COVID-19 Vaccine (1) Never done  . Pneumococcal Vaccine 0-654 26ars old (1 of 4 - PCV13) Never done  . FOOT EXAM  Never done  . OPHTHALMOLOGY EXAM  Never done    There are no preventive care reminders to display for this patient.  Lab Results  Component Value Date   TSH 1.010 08/02/2020   Lab Results  Component Value Date   WBC 8.2 08/02/2020   HGB 15.6 08/02/2020   HCT 46.1 08/02/2020   MCV 83 08/02/2020   PLT 265 08/02/2020   Lab Results  Component Value Date   NA 141 08/02/2020   K 4.6 08/02/2020   CO2 23 08/02/2020   GLUCOSE 161 (H) 08/02/2020   BUN 12 08/02/2020   CREATININE 1.17 08/02/2020   BILITOT <0.2 08/02/2020   ALKPHOS 74 08/02/2020   AST 13 08/02/2020   ALT 16 08/02/2020   PROT 7.1 08/02/2020   ALBUMIN 4.5 08/02/2020   CALCIUM 9.7 08/02/2020   ANIONGAP 11 02/26/2019   Lab Results  Component Value Date   CHOL 184 08/02/2020   Lab Results  Component Value Date   HDL 32 (L) 08/02/2020   Lab Results  Component Value Date   LDLCALC 119 (H) 08/02/2020   Lab Results  Component Value Date   TRIG 185 (H) 08/02/2020   Lab Results  Component Value Date   CHOLHDL 5.8 (H) 08/02/2020   Lab Results  Component Value Date   HGBA1C 9.2 (H) 11/01/2020      Assessment & Plan:   Problem List Items Addressed This Visit      Other   Dysuria - Primary      No orders of the defined types  were  placed in this encounter.  Dysuria 1. Dysuria Patient complains of frequent urination, strong odor, concerned he may have a kidney infection. Has noticed no discharge. Examination circumcised male with no gland irritation or discharge. No abnormal testicular mass or hernias felt. Urine sent for analysis and culture. Additional labs, including A1C ordered today. Impression benign exam.   Has FU in clinic next week.   Follow-up: No follow-ups on file.    Jaydenn Boccio Charyl Bigger

## 2021-01-31 NOTE — Progress Notes (Incomplete)
Established Patient Office Visit  Subjective:  Patient ID: Stuart Rivers, male    DOB: [birth year]  Age: 30 y.o. MRN: [id number]  CC:  Chief Complaint  Patient presents with  . Urethra Itching  . Urinary Frequency    Strong smelling urine   . Dysuria    HPI MAYFIELD SCHOENE Rivers presents for frequent urination and strong odor, discomfort while urinating.    Past Medical History:  Diagnosis Date  . Anxiety   . Depression   . No pertinent past medical history     Past Surgical History:  Procedure Laterality Date  . CLOSED REDUCTION NASAL FRACTURE  04/07/2012   Procedure: CLOSED REDUCTION NASAL FRACTURE;  Surgeon: Theodoro Kos, DO;  Location: [place];  Service: Plastics;  Laterality: N/A;  . NO PAST SURGERIES    . ORIF TIBIA FRACTURE  04/05/2012   Procedure: OPEN REDUCTION INTERNAL FIXATION (ORIF) TIBIA FRACTURE;  Surgeon: Mauri Pole, MD;  Location: [facility];  Service: Orthopedics;  Laterality: Left;    Family History  Problem Relation Age of Onset  . Arthritis Other        grandparent  . Breast cancer Other   . Heart disease Other        grandparent  . Hypertension Other        grandparent  . Diabetes Other        parent  . Diabetes Other        grandparent    Social History   Socioeconomic History  . Marital status: Single    Spouse name: Not on file  . Number of children: Not on file  . Years of education: Not on file  . Highest education level: Not on file  Occupational History  . Not on file  Tobacco Use  . Smoking status: Former Smoker    Packs/day: 1.00    Types: Cigars, Cigarettes    Quit date: 05/26/2020    Years since quitting: 0.6  . Smokeless tobacco: Never Used  . Tobacco comment: few cigarettes a day  Vaping Use  . Vaping Use: Every day  . Last attempt to quit: 09/18/2020  . Substances: Nicotine  Substance and Sexual Activity  . Alcohol use: Not Currently  . Drug use: Not Currently    Types: Cocaine    Comment: no drugs since  04/16/2020  . Sexual activity: Not Currently  Other Topics Concern  . 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 Fear of Current or Ex-Partner: No  . Emotionally Abused: No  . Physically Abused: No  . Sexually Abused: No    Outpatient Medications Prior to Visit  Medication Sig Dispense Refill  . acetaminophen (TYLENOL) 325 MG tablet Take 2 tablets (650 mg total) by mouth 2 (two) times daily. 30 tablet 0  . blood glucose meter kit and supplies KIT Dispense based on patient and insurance preference. Use  up to four times daily as directed. (FOR ICD-9 250.00, 250.01). 1 each 0  . glipiZIDE (GLUCOTROL) 5 MG tablet Take 1 tablet (5 mg total) by mouth daily before breakfast. 30 tablet 2  . glipiZIDE (GLUCOTROL) 5 MG tablet TAKE ONE TABLET BY MOUTH EVERY DAY BEFORE BREAKFAST 30 tablet 0  . lamoTRIgine (LAMICTAL) 100 MG tablet Take one tablet (129m total) by mouth once daily every evening for 2 weeks (Take along with 774min the morning). After 2 weeks, take 10041my mouth twice daily for mood. STOP IMMEDIATELY SHOULD ANY RASH OCCUR. 60 tablet 1  . lamoTRIgine (LAMICTAL) 25 MG tablet Take 25 mg by mouth daily. Pt to taper up on the dose    . metFORMIN (GLUCOPHAGE) 1000 MG tablet Take 1 tablet (1,000 mg total) by mouth 2 (two) times daily with a meal. 60 tablet 2  . metFORMIN (GLUCOPHAGE) 1000 MG tablet TAKE ONE TABLET BY MOUTH 2 TIMES A DAY WITH A MEAL 60 tablet 2  . metFORMIN (GLUCOPHAGE) 500 MG tablet TAKE ONE TABLET BY MOUTH 2 TIMES A  DAY WITH A MEAL. 60 tablet 1  . metFORMIN (GLUCOPHAGE) 500 MG tablet TAKE ONE TABLET BY MOUTH 2 TIMES A DAY WITH A MEAL 60 tablet 0  . prazosin (MINIPRESS) 1 MG capsule TAKE 3 CAPSULES BY MOUTH AT BEDTIME 90 capsule 0   No facility-administered medications prior to visit.    Allergies  Allergen Reactions  . Other Hives    latex    ROS Review of Systems    Objective:    Physical Exam  BP 117/77   Pulse 88   Temp (!) 97 F (36.1 C)   Wt 263 lb 11.2 oz (119.6 kg)   SpO2 95%   BMI 42.56 kg/m  Wt Readings from Last 3 Encounters:  01/31/21 263 lb 11.2 oz (119.6 kg)  12/07/20 267 lb 11.2 oz (121.4 kg)  11/08/20 274 lb 4.8 oz (124.4 kg)     Health Maintenance Due  Topic Date Due  . COVID-19 Vaccine (1) Never done  . Pneumococcal Vaccine 0-6104 79ars old (1 of 4 - PCV13) Never done  . FOOT EXAM  Never done  . OPHTHALMOLOGY EXAM  Never done    There are no preventive care reminders to display for this patient.  Lab Results  Component Value Date   TSH 1.010 08/02/2020   Lab Results  Component Value Date   WBC 8.2 08/02/2020   HGB 15.6 08/02/2020   HCT 46.1 08/02/2020   MCV 83 08/02/2020   PLT 265 08/02/2020   Lab Results  Component Value Date   NA 141 08/02/2020   K 4.6 08/02/2020   CO2 23 08/02/2020   GLUCOSE 161 (H) 08/02/2020   BUN 12 08/02/2020   CREATININE 1.17 08/02/2020   BILITOT <0.2 08/02/2020   ALKPHOS 74 08/02/2020   AST 13 08/02/2020   ALT 16 08/02/2020   PROT 7.1 08/02/2020   ALBUMIN 4.5 08/02/2020   CALCIUM 9.7 08/02/2020   ANIONGAP 11 02/26/2019   Lab Results  Component Value Date   CHOL 184 08/02/2020   Lab Results  Component Value Date   HDL 32 (L) 08/02/2020   Lab Results  Component Value Date   LDLCALC 119 (H) 08/02/2020   Lab Results  Component Value Date   TRIG 185 (H) 08/02/2020   Lab Results  Component Value Date   CHOLHDL 5.8 (H) 08/02/2020   Lab Results  Component Value Date   HGBA1C  9.2 (H) 11/01/2020       Assessment & Plan:   Problem List Items Addressed This Visit      Endocrine   Type 2 diabetes mellitus without complications ([place])     Other   Dysuria      No orders of the defined types were placed in this encounter. 1. Dysuria Patient complains of frequent urination, strong odor, concerned he may have a kidney infection. Has noticed no discharge. Examination circumcised male with no gland irritation or discharge. No abnormal testicular mass or hernias felt. Urine sent for analysis and culture. Additional labs, including A1C ordered today. Impression benign exam.   Has FU in clinic next week.  Follow-up: No follow-ups on file.    Micheline Markes Charyl Bigger

## 2021-02-01 LAB — HEMOGLOBIN A1C
Est. average glucose Bld gHb Est-mCnc: 148 mg/dL
Hgb A1c MFr Bld: 6.8 % — ABNORMAL HIGH (ref 4.8–5.6)

## 2021-02-03 LAB — COMPREHENSIVE METABOLIC PANEL
ALT: 22 IU/L (ref 0–44)
AST: 20 IU/L (ref 0–40)
Albumin/Globulin Ratio: 1.5 (ref 1.2–2.2)
Albumin: 4.5 g/dL (ref 4.1–5.2)
Alkaline Phosphatase: 70 IU/L (ref 44–121)
BUN/Creatinine Ratio: 11 (ref 9–20)
BUN: 12 mg/dL (ref 6–20)
Bilirubin Total: 0.2 mg/dL (ref 0.0–1.2)
CO2: 21 mmol/L (ref 20–29)
Calcium: 10.3 mg/dL — ABNORMAL HIGH (ref 8.7–10.2)
Chloride: 103 mmol/L (ref 96–106)
Creatinine, Ser: 1.09 mg/dL (ref 0.76–1.27)
Globulin, Total: 3 g/dL (ref 1.5–4.5)
Glucose: 123 mg/dL — ABNORMAL HIGH (ref 65–99)
Potassium: 4.9 mmol/L (ref 3.5–5.2)
Sodium: 140 mmol/L (ref 134–144)
Total Protein: 7.5 g/dL (ref 6.0–8.5)
eGFR: 94 mL/min/{1.73_m2} (ref >59)

## 2021-02-03 LAB — CBC WITH DIFFERENTIAL/PLATELET
Basophils Absolute: 0.1 10*3/uL (ref 0.0–0.2)
Basos: 1 %
EOS (ABSOLUTE): 0.2 10*3/uL (ref 0.0–0.4)
Eos: 2 %
Hematocrit: 48.9 % (ref 37.5–51.0)
Hemoglobin: 15.8 g/dL (ref 13.0–17.7)
Immature Grans (Abs): 0 10*3/uL (ref 0.0–0.1)
Immature Granulocytes: 0 %
Lymphocytes Absolute: 3.2 10*3/uL — ABNORMAL HIGH (ref 0.7–3.1)
Lymphs: 35 %
MCH: 26.7 pg (ref 26.6–33.0)
MCHC: 32.3 g/dL (ref 31.5–35.7)
MCV: 83 fL (ref 79–97)
Monocytes Absolute: 0.8 10*3/uL (ref 0.1–0.9)
Monocytes: 9 %
Neutrophils Absolute: 4.9 10*3/uL (ref 1.4–7.0)
Neutrophils: 53 %
Platelets: 293 10*3/uL (ref 150–450)
RBC: 5.91 x10E6/uL — ABNORMAL HIGH (ref 4.14–5.80)
RDW: 13 % (ref 11.6–15.4)
WBC: 9.2 10*3/uL (ref 3.4–10.8)

## 2021-02-03 LAB — URINALYSIS
Bilirubin, UA: NEGATIVE
Glucose, UA: NEGATIVE
Leukocytes,UA: NEGATIVE
Nitrite, UA: NEGATIVE
Protein,UA: NEGATIVE
RBC, UA: NEGATIVE
Specific Gravity, UA: >=1.03 — AB (ref 1.005–1.030)
Urobilinogen, Ur: 0.2 mg/dL (ref 0.2–1.0)
pH, UA: 5 (ref 5.0–7.5)

## 2021-02-03 LAB — LIPID PANEL
Chol/HDL Ratio: 5.1 ratio — ABNORMAL HIGH (ref 0.0–5.0)
Cholesterol, Total: 204 mg/dL — ABNORMAL HIGH (ref 100–199)
HDL: 40 mg/dL (ref >39)
LDL Chol Calc (NIH): 139 mg/dL — ABNORMAL HIGH (ref 0–99)
Triglycerides: 140 mg/dL (ref 0–149)
VLDL Cholesterol Cal: 25 mg/dL (ref 5–40)

## 2021-02-03 LAB — TSH: TSH: 1.47 u[IU]/mL (ref 0.450–4.500)

## 2021-02-03 LAB — URINE CULTURE: Organism ID, Bacteria: NO GROWTH

## 2021-02-06 ENCOUNTER — Ambulatory Visit: Payer: Medicaid Other | Admitting: Specialist

## 2021-02-06 ENCOUNTER — Other Ambulatory Visit: Payer: Self-pay

## 2021-02-06 ENCOUNTER — Ambulatory Visit: Payer: Medicaid Other | Admitting: Gerontology

## 2021-02-06 VITALS — BP 130/82 | HR 86 | Temp 97.5°F | Resp 16 | Wt 265.8 lb

## 2021-02-06 DIAGNOSIS — M25551 Pain in right hip: Secondary | ICD-10-CM

## 2021-02-06 DIAGNOSIS — E785 Hyperlipidemia, unspecified: Secondary | ICD-10-CM

## 2021-02-06 DIAGNOSIS — E119 Type 2 diabetes mellitus without complications: Secondary | ICD-10-CM

## 2021-02-06 MED ORDER — GLIPIZIDE 5 MG PO TABS
5.0000 mg | ORAL_TABLET | Freq: Every day | ORAL | 3 refills | Status: DC
Start: 2021-02-06 — End: 2021-05-09
  Filled 2021-02-06 – 2021-03-02 (×2): qty 30, 30d supply, fill #0
  Filled 2021-03-26: qty 30, 30d supply, fill #1
  Filled 2021-05-09: qty 30, 30d supply, fill #2

## 2021-02-06 MED ORDER — METFORMIN HCL 500 MG PO TABS
ORAL_TABLET | Freq: Two times a day (BID) | ORAL | 3 refills | Status: DC
  Filled 2021-02-06: qty 60, fill #0
  Filled 2021-03-02: qty 60, 30d supply, fill #0
  Filled 2021-03-26: qty 60, 30d supply, fill #1

## 2021-02-06 NOTE — Patient Instructions (Signed)
Heart-Healthy Eating Plan Heart-healthy meal planning includes: Eating less unhealthy fats. Eating more healthy fats. Making other changes in your diet. Talk with your doctor or a diet specialist (dietitian) to create an eating plan that is right for you. What is my plan? Your doctor may recommend an eating plan that includes: Total fat: ______% or less of total calories a day. Saturated fat: ______% or less of total calories a day. Cholesterol: less than _________mg a day. What are tips for following this plan? Cooking Avoid frying your food. Try to bake, boil, grill, or broil it instead. You can also reduce fat by: Removing the skin from poultry. Removing all visible fats from meats. Steaming vegetables in water or broth. Meal planning  At meals, divide your plate into four equal parts: Fill one-half of your plate with vegetables and green salads. Fill one-fourth of your plate with whole grains. Fill one-fourth of your plate with lean protein foods. Eat 4-5 servings of vegetables per day. A serving of vegetables is: 1 cup of raw or cooked vegetables. 2 cups of raw leafy greens. Eat 4-5 servings of fruit per day. A serving of fruit is: 1 medium whole fruit.  cup of dried fruit.  cup of fresh, frozen, or canned fruit.  cup of 100% fruit juice. Eat more foods that have soluble fiber. These are apples, broccoli, carrots, beans, peas, and barley. Try to get 20-30 g of fiber per day. Eat 4-5 servings of nuts, legumes, and seeds per week: 1 serving of dried beans or legumes equals  cup after being cooked. 1 serving of nuts is  cup. 1 serving of seeds equals 1 tablespoon.  General information Eat more home-cooked food. Eat less restaurant, buffet, and fast food. Limit or avoid alcohol. Limit foods that are high in starch and sugar. Avoid fried foods. Lose weight if you are overweight. Keep track of how much salt (sodium) you eat. This is important if you have high blood  pressure. Ask your doctor to tell you more about this. Try to add vegetarian meals each week. Fats Choose healthy fats. These include olive oil and canola oil, flaxseeds, walnuts, almonds, and seeds. Eat more omega-3 fats. These include salmon, mackerel, sardines, tuna, flaxseed oil, and ground flaxseeds. Try to eat fish at least 2 times each week. Check food labels. Avoid foods with trans fats or high amounts of saturated fat. Limit saturated fats. These are often found in animal products, such as meats, butter, and cream. These are also found in plant foods, such as palm oil, palm kernel oil, and coconut oil. Avoid foods with partially hydrogenated oils in them. These have trans fats. Examples are stick margarine, some tub margarines, cookies, crackers, and other baked goods. What foods can I eat? Fruits All fresh, canned (in natural juice), or frozen fruits. Vegetables Fresh or frozen vegetables (raw, steamed, roasted, or grilled). Green salads. Grains Most grains. Choose whole wheat and whole grains most of the time. Rice andpasta, including brown rice and pastas made with whole wheat. Meats and other proteins Lean, well-trimmed beef, veal, pork, and lamb. Chicken and turkey without skin. All fish and shellfish. Wild duck, rabbit, pheasant, and venison. Egg whites or low-cholesterol egg substitutes. Dried beans, peas, lentils, and tofu. Seedsand most nuts. Dairy Low-fat or nonfat cheeses, including ricotta and mozzarella. Skim or 1% milk that is liquid, powdered, or evaporated. Buttermilk that is made with low-fatmilk. Nonfat or low-fat yogurt. Fats and oils Non-hydrogenated (trans-free) margarines. Vegetable oils, including soybean, sesame,   sunflower, olive, peanut, safflower, corn, canola, and cottonseed. Salad dressings or mayonnaisemade with a vegetable oil. Beverages Mineral water. Coffee and tea. Diet carbonated beverages. Sweets and desserts Sherbet, gelatin, and fruit ice. Small  amounts of dark chocolate. Limit all sweets and desserts. Seasonings and condiments All seasonings and condiments. The items listed above may not be a complete list of foods and drinks you can eat. Contact a dietitian for more options. What foods should I avoid? Fruits Canned fruit in heavy syrup. Fruit in cream or butter sauce. Fried fruit. Limitcoconut. Vegetables Vegetables cooked in cheese, cream, or butter sauce. Fried vegetables. Grains Breads that are made with saturated or trans fats, oils, or whole milk. Croissants. Sweet rolls. Donuts. High-fat crackers,such as cheese crackers. Meats and other proteins Fatty meats, such as hot dogs, ribs, sausage, bacon, rib-eye roast or steak. High-fat deli meats, such as salami and bologna. Caviar. Domestic duck andgoose. Organ meats, such as liver. Dairy Cream, sour cream, cream cheese, and creamed cottage cheese. Whole-milk cheeses. Whole or 2% milk that is liquid, evaporated, or condensed. Whole buttermilk. Cream sauce or high-fat cheese sauce. Yogurt that is made fromwhole milk. Fats and oils Meat fat, or shortening. Cocoa butter, hydrogenated oils, palm oil, coconut oil, palm kernel oil. Solid fats and shortenings, including bacon fat, salt pork, lard, and butter. Nondairy cream substitutes. Salad dressings with cheeseor sour cream. Beverages Regular sodas and juice drinks with added sugar. Sweets and desserts Frosting. Pudding. Cookies. Cakes. Pies. Milk chocolate or white chocolate.Buttered syrups. Full-fat ice cream or ice cream drinks. The items listed above may not be a complete list of foods and drinks to avoid. Contact a dietitian for more information. Summary Heart-healthy meal planning includes eating less unhealthy fats, eating more healthy fats, and making other changes in your diet. Eat a balanced diet. This includes fruits and vegetables, low-fat or nonfat dairy, lean protein, nuts and legumes, whole grains, and heart-healthy  oils and fats. This information is not intended to replace advice given to you by your health care provider. Make sure you discuss any questions you have with your healthcare provider. Document Revised: 10/16/2017 Document Reviewed: 09/19/2017 Elsevier Patient Education  2022 Elsevier Inc. https://www.diabeteseducator.org/docs/default-source/living-with-diabetes/conquering-the-grocery-store-v1.pdf?sfvrsn=4">  Carbohydrate Counting for Diabetes Mellitus, Adult Carbohydrate counting is a method of keeping track of how many carbohydrates you eat. Eating carbohydrates naturally increases the amount of sugar (glucose) in the blood. Counting how many carbohydrates you eat improves your bloodglucose control, which helps you manage your diabetes. It is important to know how many carbohydrates you can safely have in each meal. This is different for every person. A dietitian can help you make a meal plan and calculate how many carbohydrates you should have at each meal andsnack. What foods contain carbohydrates? Carbohydrates are found in the following foods: Grains, such as breads and cereals. Dried beans and soy products. Starchy vegetables, such as potatoes, peas, and corn. Fruit and fruit juices. Milk and yogurt. Sweets and snack foods, such as cake, cookies, candy, chips, and soft drinks. How do I count carbohydrates in foods? There are two ways to count carbohydrates in food. You can read food labels or learn standard serving sizes of foods. You can use either of the methods or acombination of both. Using the Nutrition Facts label The Nutrition Facts list is included on the labels of almost all packaged foods and beverages in the [place] It includes: The serving size. Information about nutrients in each serving, including the grams (g) of carbohydrate per serving.   To use the Nutrition Facts: Decide how many servings you will have. Multiply the number of servings by the number of carbohydrates per  serving. The resulting number is the total amount of carbohydrates that you will be having. Learning the standard serving sizes of foods When you eat carbohydrate foods that are not packaged or do not include Nutrition Facts on the label, you need to measure the servings in order to count the amount of carbohydrates. Measure the foods that you will eat with a food scale or measuring cup, if needed. Decide how many standard-size servings you will eat. Multiply the number of servings by 15. For foods that contain carbohydrates, one serving equals 15 g of carbohydrates. For example, if you eat 2 cups or 10 oz (300 g) of strawberries, you will have eaten 2 servings and 30 g of carbohydrates (2 servings x 15 g = 30 g). For foods that have more than one food mixed, such as soups and casseroles, you must count the carbohydrates in each food that is included. The following list contains standard serving sizes of common carbohydrate-rich foods. Each of these servings has about 15 g of carbohydrates: 1 slice of bread. 1 six-inch (15 cm) tortilla. ? cup or 2 oz (53 g) cooked rice or pasta.  cup or 3 oz (85 g) cooked or canned, drained and rinsed beans or lentils.  cup or 3 oz (85 g) starchy vegetable, such as peas, corn, or squash.  cup or 4 oz (120 g) hot cereal.  cup or 3 oz (85 g) boiled or mashed potatoes, or  or 3 oz (85 g) of a large baked potato.  cup or 4 fl oz (118 mL) fruit juice. 1 cup or 8 fl oz (237 mL) milk. 1 small or 4 oz (106 g) apple.  or 2 oz (63 g) of a medium banana. 1 cup or 5 oz (150 g) strawberries. 3 cups or 1 oz (24 g) popped popcorn. What is an example of carbohydrate counting? To calculate the number of carbohydrates in this sample meal, follow the stepsshown below. Sample meal 3 oz (85 g) chicken breast. ? cup or 4 oz (106 g) brown rice.  cup or 3 oz (85 g) corn. 1 cup or 8 fl oz (237 mL) milk. 1 cup or 5 oz (150 g) strawberries with sugar-free whipped  topping. Carbohydrate calculation Identify the foods that contain carbohydrates: Rice. Corn. Milk. Strawberries. Calculate how many servings you have of each food: 2 servings rice. 1 serving corn. 1 serving milk. 1 serving strawberries. Multiply each number of servings by 15 g: 2 servings rice x 15 g = 30 g. 1 serving corn x 15 g = 15 g. 1 serving milk x 15 g = 15 g. 1 serving strawberries x 15 g = 15 g. Add together all of the amounts to find the total grams of carbohydrates eaten: 30 g + 15 g + 15 g + 15 g = 75 g of carbohydrates total. What are tips for following this plan? Shopping Develop a meal plan and then make a shopping list. Buy fresh and frozen vegetables, fresh and frozen fruit, dairy, eggs, beans, lentils, and whole grains. Look at food labels. Choose foods that have more fiber and less sugar. Avoid processed foods and foods with added sugars. Meal planning Aim to have the same amount of carbohydrates at each meal and for each snack time. Plan to have regular, balanced meals and snacks. Where to find more information American   Diabetes Association: www.diabetes.org Centers for Disease Control and Prevention: www.cdc.gov Summary Carbohydrate counting is a method of keeping track of how many carbohydrates you eat. Eating carbohydrates naturally increases the amount of sugar (glucose) in the blood. Counting how many carbohydrates you eat improves your blood glucose control, which helps you manage your diabetes. A dietitian can help you make a meal plan and calculate how many carbohydrates you should have at each meal and snack. This information is not intended to replace advice given to you by your health care provider. Make sure you discuss any questions you have with your healthcare provider. Document Revised: 08/12/2019 Document Reviewed: 08/13/2019 Elsevier Patient Education  2021 [employer] Inc.  

## 2021-02-06 NOTE — Progress Notes (Signed)
Established Patient Office Visit  Subjective:  Patient ID: Stuart Rivers, male    DOB: 1991/02/02  Age: 30 y.o. MRN: [id number]  CC: No chief complaint on file.   HPI Stuart Rivers  who has a history of Anxiety, Depression and Type 2 diabetes presents for follow up of type 2 diabtes mellitus. His HgbA1c decreased from 9.2% to 6.8%. He states that he's compliant with his medications, and adheres to ADA diet. He checks his blood glucose tid, and his fasting readings are between 100-125 mg/dl and less than 100 mg/dl before bedtime. He denies hypo/hyperglycemic symptoms, peripheral neuropathy and performs daily foot exams. He's yet to follow up with an Ophthalmologist, but he denies any vision changes. His LDL was 139 mg/dl, total cholesterol was 204 mg/dl, he states that he's unable to make dietary changes because he resides at [facility] facility for rehabilitation. Overall, he states that he's doing well and offers no further complaint.  Past Medical History:  Diagnosis Date   Anxiety    Depression    No pertinent past medical history     Past Surgical History:  Procedure Laterality Date   CLOSED REDUCTION NASAL FRACTURE  04/07/2012   Procedure: CLOSED REDUCTION NASAL FRACTURE;  Surgeon: Theodoro Kos, DO;  Location: [facility];  Service: Plastics;  Laterality: N/A;   NO PAST SURGERIES     ORIF TIBIA FRACTURE  04/05/2012   Procedure: OPEN REDUCTION INTERNAL FIXATION (ORIF) TIBIA FRACTURE;  Surgeon: Mauri Pole, MD;  Location: [facility];  Service: Orthopedics;  Laterality: Left;    Family History  Problem Relation Age of Onset   Arthritis Other        grandparent   Breast cancer Other    Heart disease Other        grandparent   Hypertension Other        grandparent   Diabetes Other        parent   Diabetes Other        grandparent    Social History   Socioeconomic History   Marital status: Single    Spouse name: Not on file   Number of children: Not on file   Years  of education: Not on file   Highest education level: Not on file  Occupational History   Not on file  Tobacco Use   Smoking status: Former    Packs/day: 1.00    Pack years: 0.00    Types: Cigars, Cigarettes    Quit date: 05/26/2020    Years since quitting: 0.7   Smokeless tobacco: Never   Tobacco comments:    few cigarettes a day  Vaping Use   Vaping Use: Every day   Last attempt to quit: 09/18/2020   Substances: Nicotine  Substance and Sexual Activity   Alcohol use: Not Currently   Drug use: Not Currently    Types: Cocaine    Comment: no drugs since 04/16/2020   Sexual activity: Not Currently  Other Topics Concern   Not on file  Social History Narrative   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      Spiritual Beliefs: Christian      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, international aid - poor diet            Social Determinants of Radio broadcast assistant Strain: Not on file  Food Insecurity: Not on file  Transportation Needs: Not on  file  Physical Activity: Not on file  Stress: Not on file  Social Connections: Not on file  Intimate Partner Violence: Not At Risk   Fear of Current or Ex-Partner: No   Emotionally Abused: No   Physically Abused: No   Sexually Abused: No    Outpatient Medications Prior to Visit  Medication Sig Dispense Refill   acetaminophen (TYLENOL) 325 MG tablet Take 2 tablets (650 mg total) by mouth 2 (two) times daily. 30 tablet 0   blood glucose meter kit and supplies KIT Dispense based on patient and insurance preference. Use up to four times daily as directed. (FOR ICD-9 250.00, 250.01). 1 each 0   lamoTRIgine (LAMICTAL) 100 MG tablet Take one tablet (121m total) by mouth once daily every evening for 2 weeks (Take along with 733min the morning). After 2 weeks, take 10068my mouth twice daily for mood. STOP IMMEDIATELY SHOULD ANY RASH OCCUR. 60 tablet 1   prazosin (MINIPRESS) 1 MG capsule TAKE 3  CAPSULES BY MOUTH AT BEDTIME 90 capsule 0   glipiZIDE (GLUCOTROL) 5 MG tablet Take 1 tablet (5 mg total) by mouth daily before breakfast. 30 tablet 2   glipiZIDE (GLUCOTROL) 5 MG tablet TAKE ONE TABLET BY MOUTH EVERY DAY BEFORE BREAKFAST 30 tablet 0   lamoTRIgine (LAMICTAL) 25 MG tablet Take 25 mg by mouth daily. Pt to taper up on the dose     metFORMIN (GLUCOPHAGE) 1000 MG tablet Take 1 tablet (1,000 mg total) by mouth 2 (two) times daily with a meal. 60 tablet 2   metFORMIN (GLUCOPHAGE) 1000 MG tablet TAKE ONE TABLET BY MOUTH 2 TIMES A DAY WITH A MEAL 60 tablet 2   metFORMIN (GLUCOPHAGE) 500 MG tablet TAKE ONE TABLET BY MOUTH 2 TIMES A DAY WITH A MEAL. 60 tablet 1   metFORMIN (GLUCOPHAGE) 500 MG tablet TAKE ONE TABLET BY MOUTH 2 TIMES A DAY WITH A MEAL 60 tablet 0   No facility-administered medications prior to visit.    Allergies  Allergen Reactions   Other Hives    latex    ROS Review of Systems  Constitutional: Negative.   Eyes: Negative.   Respiratory: Negative.    Cardiovascular: Negative.   Endocrine: Negative.   Skin: Negative.   Neurological: Negative.   Psychiatric/Behavioral: Negative.       Objective:    Physical Exam HENT:     Head: Normocephalic and atraumatic.     Mouth/Throat:     Mouth: Mucous membranes are moist.  Eyes:     Extraocular Movements: Extraocular movements intact.     Conjunctiva/sclera: Conjunctivae normal.     Pupils: Pupils are equal, round, and reactive to light.  Cardiovascular:     Rate and Rhythm: Normal rate and regular rhythm.     Pulses: Normal pulses.     Heart sounds: Normal heart sounds.  Pulmonary:     Effort: Pulmonary effort is normal.     Breath sounds: Normal breath sounds.  Skin:    General: Skin is warm.  Neurological:     General: No focal deficit present.     Mental Status: He is alert and oriented to person, place, and time. Mental status is at baseline.  Psychiatric:        Mood and Affect: Mood normal.         Behavior: Behavior normal.        Thought Content: Thought content normal.        Judgment: Judgment normal.  BP 130/82 (BP Location: Left Arm, Patient Position: Sitting, Cuff Size: Large)   Pulse 86   Temp (!) 97.5 F (36.4 C)   Resp 16   Wt 265 lb 12.8 oz (120.6 kg)   SpO2 96%   BMI 42.90 kg/m  Wt Readings from Last 3 Encounters:  02/06/21 265 lb 12.8 oz (120.6 kg)  01/31/21 263 lb 11.2 oz (119.6 kg)  12/07/20 267 lb 11.2 oz (121.4 kg)   Encouraged weight loss  Health Maintenance Due  Topic Date Due   COVID-19 Vaccine (1) Never done   Pneumococcal Vaccine 103-58 Years old (1 - PCV) Never done   FOOT EXAM  Never done   OPHTHALMOLOGY EXAM  Never done   Zoster Vaccines- Shingrix (1 of 2) Never done    There are no preventive care reminders to display for this patient.  Lab Results  Component Value Date   TSH 1.470 01/31/2021   Lab Results  Component Value Date   WBC 9.2 01/31/2021   HGB 15.8 01/31/2021   HCT 48.9 01/31/2021   MCV 83 01/31/2021   PLT 293 01/31/2021   Lab Results  Component Value Date   NA 140 01/31/2021   K 4.9 01/31/2021   CO2 21 01/31/2021   GLUCOSE 123 (H) 01/31/2021   BUN 12 01/31/2021   CREATININE 1.09 01/31/2021   BILITOT 0.2 01/31/2021   ALKPHOS 70 01/31/2021   AST 20 01/31/2021   ALT 22 01/31/2021   PROT 7.5 01/31/2021   ALBUMIN 4.5 01/31/2021   CALCIUM 10.3 (H) 01/31/2021   ANIONGAP 11 02/26/2019   EGFR 94 01/31/2021   Lab Results  Component Value Date   CHOL 204 (H) 01/31/2021   Lab Results  Component Value Date   HDL 40 01/31/2021   Lab Results  Component Value Date   LDLCALC 139 (H) 01/31/2021   Lab Results  Component Value Date   TRIG 140 01/31/2021   Lab Results  Component Value Date   CHOLHDL 5.1 (H) 01/31/2021   Lab Results  Component Value Date   HGBA1C 6.8 (H) 01/31/2021      Assessment & Plan:     1. Type 2 diabetes mellitus without complication, without long-term current use of insulin  (HCC) -His Diabetes is improving, his HgbA1c was 6.8%, he was encouraged to continue on low carb/non concentrated sweet diet and exercise as tolerated. - Ambulatory referral to Ophthalmology - HgB A1c; Future - glipiZIDE (GLUCOTROL) 5 MG tablet; Take 1 tablet (5 mg total) by mouth daily before breakfast.  Dispense: 30 tablet; Refill: 3 - metFORMIN (GLUCOPHAGE) 500 MG tablet; TAKE ONE TABLET BY MOUTH 2 TIMES A DAY WITH A MEAL.  Dispense: 60 tablet; Refill: 3  2. Elevated lipids -He was encouraged to continue on low fat/cholesterol diet and exercise as tolerated.     Follow-up: Return in about 13 weeks (around 05/08/2021), or if symptoms worsen or fail to improve.    Vikrant Pryce Jerold Coombe, NP

## 2021-02-06 NOTE — Progress Notes (Signed)
  Subjective:     Patient ID: Stuart Rivers, male   DOB: 05-20-1991, 30 y.o.   MRN: [id number]  HPI Pain continues. X-rays are negative.    Review of Systems     Objective:   Physical Exam    Minimal lurch to the R. FORM right hip with neg. FABER FADIR  Assessment:         Plan:        Refer to [facility] for second opinion.

## 2021-02-07 ENCOUNTER — Ambulatory Visit: Payer: Medicaid Other | Admitting: Gerontology

## 2021-03-02 ENCOUNTER — Other Ambulatory Visit: Payer: Self-pay

## 2021-03-02 MED ORDER — LAMOTRIGINE 100 MG PO TABS
ORAL_TABLET | ORAL | 1 refills | Status: DC
  Filled 2021-03-02: qty 30, 15d supply, fill #0
  Filled 2021-03-19: qty 30, 15d supply, fill #1
  Filled 2021-03-21: qty 60, 30d supply, fill #1
  Filled 2021-04-26: qty 30, 15d supply, fill #2

## 2021-03-05 ENCOUNTER — Other Ambulatory Visit: Payer: Self-pay

## 2021-03-05 ENCOUNTER — Ambulatory Visit (INDEPENDENT_AMBULATORY_CARE_PROVIDER_SITE_OTHER): Payer: Federal, State, Local not specified - Other | Admitting: Orthopaedic Surgery

## 2021-03-05 VITALS — Ht 66.0 in | Wt 265.8 lb

## 2021-03-05 DIAGNOSIS — G8929 Other chronic pain: Secondary | ICD-10-CM

## 2021-03-05 DIAGNOSIS — M25551 Pain in right hip: Secondary | ICD-10-CM

## 2021-03-05 MED ORDER — METHYLPREDNISOLONE ACETATE 40 MG/ML IJ SUSP
40.0000 mg | INTRAMUSCULAR | Status: AC | PRN
  Administered 2021-03-05: 40 mg via INTRA_ARTICULAR

## 2021-03-05 MED ORDER — LIDOCAINE HCL 1 % IJ SOLN
3.0000 mL | INTRAMUSCULAR | Status: AC | PRN
  Administered 2021-03-05: 3 mL
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
## 2021-03-19 ENCOUNTER — Other Ambulatory Visit: Payer: Self-pay

## 2021-03-19 ENCOUNTER — Ambulatory Visit (INDEPENDENT_AMBULATORY_CARE_PROVIDER_SITE_OTHER): Payer: Federal, State, Local not specified - Other | Admitting: Orthopaedic Surgery

## 2021-03-19 DIAGNOSIS — G8929 Other chronic pain: Secondary | ICD-10-CM

## 2021-03-19 DIAGNOSIS — M25551 Pain in right hip: Secondary | ICD-10-CM

## 2021-03-19 NOTE — Progress Notes (Signed)
The patient comes in today with continued right hip pain.  He is only 30 years old and his pain was of the trochanteric area.  He denies any groin pain.  This hurts with lunges and squats.  He does have continued pain in spite of the steroid injection.  He says the pain is moved somewhat in the area injected help but the other areas that he is hurting had no effect on it.  His right hip moves smoothly and fluidly no pain in the groin at all.  His pain seems to be over the trochanteric area and the gluteus minimus and medius tendon areas where they insert as well as the proximal IT band.  At this point I would like to send him to outpatient physical therapy in [place] at [facility] for any modalities that can help decrease his right hip pain.  This can be per the therapist discretion.  He agrees with this treatment plan.  We will see him back in 6 weeks after course of therapy.

## 2021-03-20 ENCOUNTER — Other Ambulatory Visit: Payer: Self-pay

## 2021-03-21 ENCOUNTER — Other Ambulatory Visit: Payer: Self-pay

## 2021-03-21 ENCOUNTER — Other Ambulatory Visit: Payer: Self-pay | Admitting: Gerontology

## 2021-03-23 ENCOUNTER — Other Ambulatory Visit: Payer: Self-pay

## 2021-03-26 ENCOUNTER — Other Ambulatory Visit: Payer: Self-pay

## 2021-04-03 ENCOUNTER — Other Ambulatory Visit: Payer: Self-pay

## 2021-04-03 ENCOUNTER — Other Ambulatory Visit: Payer: Self-pay | Admitting: Gerontology

## 2021-04-03 DIAGNOSIS — E119 Type 2 diabetes mellitus without complications: Secondary | ICD-10-CM

## 2021-04-03 MED ORDER — METFORMIN HCL 1000 MG PO TABS
1000.0000 mg | ORAL_TABLET | Freq: Two times a day (BID) | ORAL | 3 refills | Status: DC
  Filled 2021-04-03: qty 60, 30d supply, fill #0
  Filled 2021-04-16: qty 180, 90d supply, fill #0

## 2021-04-13 ENCOUNTER — Other Ambulatory Visit: Payer: Self-pay

## 2021-04-16 ENCOUNTER — Other Ambulatory Visit: Payer: Self-pay

## 2021-04-17 ENCOUNTER — Other Ambulatory Visit: Payer: Self-pay

## 2021-04-26 ENCOUNTER — Other Ambulatory Visit: Payer: Self-pay

## 2021-05-01 ENCOUNTER — Encounter: Payer: Self-pay | Admitting: Orthopaedic Surgery

## 2021-05-01 ENCOUNTER — Ambulatory Visit (INDEPENDENT_AMBULATORY_CARE_PROVIDER_SITE_OTHER): Payer: Self-pay | Admitting: Orthopaedic Surgery

## 2021-05-01 DIAGNOSIS — M7061 Trochanteric bursitis, right hip: Secondary | ICD-10-CM | POA: Insufficient documentation

## 2021-05-01 NOTE — Progress Notes (Signed)
HPI: Stuart Rivers returns today for follow-up of right hip pain.  He still having pain lateral aspect of the hip.  He states pain waxes and wanes.  However currently getting worse.  Is worse when going from a squatting motion to standing position.  He unfortunately did not get a physical therapy per notes in the computer they tried to contact him.  He has been doing some stretching on his own at home.  He denies any radicular symptoms down the right leg.  Pain is mostly lateral aspect of the hip.  Injection gave some temporary relief right lateral hip.  Physical exam: General well-developed well-nourished male no acute distress mood affect appropriate. Psych: Alert and oriented x3 Left hip excellent range of motion without pain.  Right hip he has discomfort with extremes of external rotation but good range of motion of the hip.  Tenderness over the right hip trochanteric region.  Impression: Right hip trochanteric bursitis  Plan: He was shown IT band stretching exercises he can do on his own at home.  He is given the phone number for physical therapy contact him and get into work with the knee.  Pain persist he could always undergo another trochanteric injection in October.  Otherwise he will follow-up with [place] as needed.  Questions encouraged and answered

## 2021-05-02 ENCOUNTER — Other Ambulatory Visit: Payer: Self-pay

## 2021-05-02 ENCOUNTER — Other Ambulatory Visit: Payer: Medicaid Other

## 2021-05-02 DIAGNOSIS — E119 Type 2 diabetes mellitus without complications: Secondary | ICD-10-CM

## 2021-05-03 LAB — HEMOGLOBIN A1C
Est. average glucose Bld gHb Est-mCnc: 137 mg/dL
Hgb A1c MFr Bld: 6.4 % — ABNORMAL HIGH (ref 4.8–5.6)

## 2021-05-07 ENCOUNTER — Other Ambulatory Visit: Payer: Self-pay

## 2021-05-07 MED ORDER — HYDROXYZINE PAMOATE 25 MG PO CAPS
25.0000 mg | ORAL_CAPSULE | Freq: Three times a day (TID) | ORAL | 3 refills | Status: DC | PRN
  Filled 2021-05-07: qty 30, 10d supply, fill #0
  Filled 2021-06-13: qty 30, 10d supply, fill #1
  Filled 2021-09-19: qty 30, 10d supply, fill #2
  Filled 2021-11-14: qty 30, 10d supply, fill #3

## 2021-05-07 MED ORDER — LAMOTRIGINE 100 MG PO TABS
ORAL_TABLET | Freq: Two times a day (BID) | ORAL | 4 refills | Status: DC
  Filled 2021-05-07: qty 60, 30d supply, fill #0
  Filled 2021-06-13: qty 60, 30d supply, fill #1
  Filled 2021-07-23: qty 60, 30d supply, fill #2
  Filled 2021-09-19: qty 60, 30d supply, fill #3
  Filled 2021-11-14: qty 60, 30d supply, fill #4

## 2021-05-08 ENCOUNTER — Other Ambulatory Visit: Payer: Self-pay

## 2021-05-09 ENCOUNTER — Other Ambulatory Visit: Payer: Self-pay

## 2021-05-09 ENCOUNTER — Ambulatory Visit: Payer: Medicaid Other | Admitting: Gerontology

## 2021-05-09 ENCOUNTER — Encounter: Payer: Self-pay | Admitting: Gerontology

## 2021-05-09 VITALS — BP 132/80 | HR 79 | Temp 97.9°F | Ht 66.0 in | Wt 256.3 lb

## 2021-05-09 DIAGNOSIS — E119 Type 2 diabetes mellitus without complications: Secondary | ICD-10-CM

## 2021-05-09 MED ORDER — METFORMIN HCL 1000 MG PO TABS
1000.0000 mg | ORAL_TABLET | Freq: Two times a day (BID) | ORAL | 3 refills | Status: DC
  Filled 2021-05-09 – 2021-07-23 (×3): qty 60, 30d supply, fill #0
  Filled 2021-09-19: qty 60, 30d supply, fill #1

## 2021-05-09 NOTE — Progress Notes (Signed)
Established Patient Office Visit  Subjective:  Patient ID: Stuart Rivers, male    DOB: 21-Apr-1991  Age: 30 y.o. MRN: [id number]  CC:  Chief Complaint  Patient presents with   Follow-up    Follow-up from labs last week    HPI Stuart Rivers  is a 30 y/o male who has a history of Anxiety, Depression and Type 2 diabetes presents for routine follow up, lab review and medication refill. His HgbA1c done on 05/02/21 decreased from 6.8% to 6.4%. He states that he's compliant with his medications and continues to make healthy lifestyle changes. He checks his fasting blood glucose daily and his reading are less than 120 mg/dl. He reports having some readings in the upper 60 mg/dl with hypoglycemic symptoms. He denies peripheral neuropathy and performs daily foot checks. He continues to reside at [facility] for rehabilitation. Overall, he states that he's doing well and offers no further complaint.  Past Medical History:  Diagnosis Date   Anxiety    Depression    No pertinent past medical history     Past Surgical History:  Procedure Laterality Date   CLOSED REDUCTION NASAL FRACTURE  04/07/2012   Procedure: CLOSED REDUCTION NASAL FRACTURE;  Surgeon: Theodoro Kos, DO;  Location: [place];  Service: Plastics;  Laterality: N/A;   NO PAST SURGERIES     ORIF TIBIA FRACTURE  04/05/2012   Procedure: OPEN REDUCTION INTERNAL FIXATION (ORIF) TIBIA FRACTURE;  Surgeon: Mauri Pole, MD;  Location: [place];  Service: Orthopedics;  Laterality: Left;    Family History  Problem Relation Age of Onset   Arthritis Other        grandparent   Breast cancer Other    Heart disease Other        grandparent   Hypertension Other        grandparent   Diabetes Other        parent   Diabetes Other        grandparent    Social History   Socioeconomic History   Marital status: Single    Spouse name: Not on file   Number of children: Not on file   Years of education: Not on file   Highest education  level: Not on file  Occupational History   Not on file  Tobacco Use   Smoking status: Some Days    Packs/day: 0.25    Types: Cigars, Cigarettes    Last attempt to quit: 05/26/2020    Years since quitting: 0.9   Smokeless tobacco: Never   Tobacco comments:    'maybe one on the weekends'  Vaping Use   Vaping Use: Every day   Last attempt to quit: 09/18/2020   Substances: Nicotine  Substance and Sexual Activity   Alcohol use: Not Currently   Drug use: Not Currently    Types: Cocaine    Comment: no drugs since 04/16/2020   Sexual activity: Not Currently  Other Topics Concern   Not on file  Social History Narrative   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      Spiritual Beliefs: Christian      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, international aid - poor diet            Social Determinants of Radio broadcast assistant Strain: Not on file  Food Insecurity: No Food Insecurity   Worried About Charity fundraiser in the  Last Year: Never true   Ran Out of Food in the Last Year: Never true  Transportation Needs: No Transportation Needs   Lack of Transportation (Medical): No   Lack of Transportation (Non-Medical): No  Physical Activity: Not on file  Stress: Not on file  Social Connections: Not on file  Intimate Partner Violence: Not At Risk   Fear of Current or Ex-Partner: No   Emotionally Abused: No   Physically Abused: No   Sexually Abused: No    Outpatient Medications Prior to Visit  Medication Sig Dispense Refill   acetaminophen (TYLENOL) 325 MG tablet Take 2 tablets (650 mg total) by mouth 2 (two) times daily. 30 tablet 0   hydrOXYzine (VISTARIL) 25 MG capsule Take 1 capsule (25 mg total) by mouth 3 (three) times daily as needed for anxiety. 30 capsule 3   lamoTRIgine (LAMICTAL) 100 MG tablet TAKE ONE TABLET BY MOUTH TWICE DAILY. 60 tablet 4   glipiZIDE (GLUCOTROL) 5 MG tablet Take 1 tablet (5 mg total) by mouth daily before  breakfast. 30 tablet 3   metFORMIN (GLUCOPHAGE) 1000 MG tablet Take 1 tablet (1,000 mg total) by mouth 2 (two) times daily with a meal. 60 tablet 3   blood glucose meter kit and supplies KIT Dispense based on patient and insurance preference. Use up to four times daily as directed. (FOR ICD-9 250.00, 250.01). 1 each 0   prazosin (MINIPRESS) 1 MG capsule TAKE 3 CAPSULES BY MOUTH AT BEDTIME (Patient not taking: Reported on 05/09/2021) 90 capsule 0   No facility-administered medications prior to visit.    Allergies  Allergen Reactions   Other Hives    latex    ROS Review of Systems  Constitutional: Negative.   Eyes: Negative.   Respiratory: Negative.    Cardiovascular: Negative.   Skin: Negative.   Neurological: Negative.   Psychiatric/Behavioral: Negative.       Objective:    Physical Exam HENT:     Head: Normocephalic and atraumatic.     Mouth/Throat:     Mouth: Mucous membranes are moist.  Eyes:     Extraocular Movements: Extraocular movements intact.     Conjunctiva/sclera: Conjunctivae normal.     Pupils: Pupils are equal, round, and reactive to light.  Cardiovascular:     Rate and Rhythm: Normal rate and regular rhythm.     Pulses: Normal pulses.     Heart sounds: Normal heart sounds.  Pulmonary:     Effort: Pulmonary effort is normal.     Breath sounds: Normal breath sounds.  Skin:    General: Skin is warm.  Neurological:     General: No focal deficit present.     Mental Status: He is alert and oriented to person, place, and time. Mental status is at baseline.    BP 132/80   Pulse 79   Temp 97.9 F (36.6 C)   Ht 5' 6"  (1.676 m)   Wt 256 lb 4.8 oz (116.3 kg)   SpO2 95%   BMI 41.37 kg/m  Wt Readings from Last 3 Encounters:  05/09/21 256 lb 4.8 oz (116.3 kg)  05/02/21 257 lb (116.6 kg)  03/05/21 265 lb 12.8 oz (120.6 kg)   Weight loss was encouraged  Health Maintenance Due  Topic Date Due   COVID-19 Vaccine (1) Never done   OPHTHALMOLOGY EXAM   Never done   Pneumococcal Vaccine 24-105 Years old (2 - PCV) 04/07/2013    There are no preventive care reminders to display for this  patient.  Lab Results  Component Value Date   TSH 1.470 01/31/2021   Lab Results  Component Value Date   WBC 9.2 01/31/2021   HGB 15.8 01/31/2021   HCT 48.9 01/31/2021   MCV 83 01/31/2021   PLT 293 01/31/2021   Lab Results  Component Value Date   NA 140 01/31/2021   K 4.9 01/31/2021   CO2 21 01/31/2021   GLUCOSE 123 (H) 01/31/2021   BUN 12 01/31/2021   CREATININE 1.09 01/31/2021   BILITOT 0.2 01/31/2021   ALKPHOS 70 01/31/2021   AST 20 01/31/2021   ALT 22 01/31/2021   PROT 7.5 01/31/2021   ALBUMIN 4.5 01/31/2021   CALCIUM 10.3 (H) 01/31/2021   ANIONGAP 11 02/26/2019   EGFR 94 01/31/2021   Lab Results  Component Value Date   CHOL 204 (H) 01/31/2021   Lab Results  Component Value Date   HDL 40 01/31/2021   Lab Results  Component Value Date   LDLCALC 139 (H) 01/31/2021   Lab Results  Component Value Date   TRIG 140 01/31/2021   Lab Results  Component Value Date   CHOLHDL 5.1 (H) 01/31/2021   Lab Results  Component Value Date   HGBA1C 6.4 (H) 05/02/2021      Assessment & Plan:   1. Type 2 diabetes mellitus without complication, without long-term current use of insulin (HCC) - His HgbA1c is improving at 6.4%, due to hypoglycemia, his glipizide was discontinued and he will continue on Metformin, low carb/non concentrated sweet diet and exercise as tolerated. - metFORMIN (GLUCOPHAGE) 1000 MG tablet; Take 1 tablet (1,000 mg total) by mouth 2 (two) times daily with a meal.  Dispense: 60 tablet; Refill: 3      Follow-up: Return in about 3 months (around 08/07/2021), or if symptoms worsen or fail to improve.    Mariaceleste Herrera Jerold Coombe, NP

## 2021-05-09 NOTE — Patient Instructions (Signed)
Carbohydrate Counting for Diabetes Mellitus, Adult Carbohydrate counting is a method of keeping track of how many carbohydrates you eat. Eating carbohydrates naturally increases the amount of sugar (glucose) in the blood. Counting how many carbohydrates you eat improves your blood glucose control, which helps you manage your diabetes. It is important to know how many carbohydrates you can safely have in each meal. This is different for every person. A dietitian can help you make a meal plan and calculate how many carbohydrates you should have at each meal and snack. What foods contain carbohydrates? Carbohydrates are found in the following foods: Grains, such as breads and cereals. Dried beans and soy products. Starchy vegetables, such as potatoes, peas, and corn. Fruit and fruit juices. Milk and yogurt. Sweets and snack foods, such as cake, cookies, candy, chips, and soft drinks. How do I count carbohydrates in foods? There are two ways to count carbohydrates in food. You can read food labels or learn standard serving sizes of foods. You can use either of the methods or a combination of both. Using the Nutrition Facts label The Nutrition Facts list is included on the labels of almost all packaged foods and beverages in the [place] It includes: The serving size. Information about nutrients in each serving, including the grams (g) of carbohydrate per serving. To use the Nutrition Facts: Decide how many servings you will have. Multiply the number of servings by the number of carbohydrates per serving. The resulting number is the total amount of carbohydrates that you will be having. Learning the standard serving sizes of foods When you eat carbohydrate foods that are not packaged or do not include Nutrition Facts on the label, you need to measure the servings in order to count the amount of carbohydrates. Measure the foods that you will eat with a food scale or measuring cup, if needed. Decide how  many standard-size servings you will eat. Multiply the number of servings by 15. For foods that contain carbohydrates, one serving equals 15 g of carbohydrates. For example, if you eat 2 cups or 10 oz (300 g) of strawberries, you will have eaten 2 servings and 30 g of carbohydrates (2 servings x 15 g = 30 g). For foods that have more than one food mixed, such as soups and casseroles, you must count the carbohydrates in each food that is included. The following list contains standard serving sizes of common carbohydrate-rich foods. Each of these servings has about 15 g of carbohydrates: 1 slice of bread. 1 six-inch (15 cm) tortilla. ? cup or 2 oz (53 g) cooked rice or pasta.  cup or 3 oz (85 g) cooked or canned, drained and rinsed beans or lentils.  cup or 3 oz (85 g) starchy vegetable, such as peas, corn, or squash.  cup or 4 oz (120 g) hot cereal.  cup or 3 oz (85 g) boiled or mashed potatoes, or  or 3 oz (85 g) of a large baked potato.  cup or 4 fl oz (118 mL) fruit juice. 1 cup or 8 fl oz (237 mL) milk. 1 small or 4 oz (106 g) apple.  or 2 oz (63 g) of a medium banana. 1 cup or 5 oz (150 g) strawberries. 3 cups or 1 oz (24 g) popped popcorn. What is an example of carbohydrate counting? To calculate the number of carbohydrates in this sample meal, follow the steps shown below. Sample meal 3 oz (85 g) chicken breast. ? cup or 4 oz (106 g) brown   rice.  cup or 3 oz (85 g) corn. 1 cup or 8 fl oz (237 mL) milk. 1 cup or 5 oz (150 g) strawberries with sugar-free whipped topping. Carbohydrate calculation Identify the foods that contain carbohydrates: Rice. Corn. Milk. Strawberries. Calculate how many servings you have of each food: 2 servings rice. 1 serving corn. 1 serving milk. 1 serving strawberries. Multiply each number of servings by 15 g: 2 servings rice x 15 g = 30 g. 1 serving corn x 15 g = 15 g. 1 serving milk x 15 g = 15 g. 1 serving strawberries x 15 g = 15  g. Add together all of the amounts to find the total grams of carbohydrates eaten: 30 g + 15 g + 15 g + 15 g = 75 g of carbohydrates total. What are tips for following this plan? Shopping Develop a meal plan and then make a shopping list. Buy fresh and frozen vegetables, fresh and frozen fruit, dairy, eggs, beans, lentils, and whole grains. Look at food labels. Choose foods that have more fiber and less sugar. Avoid processed foods and foods with added sugars. Meal planning Aim to have the same amount of carbohydrates at each meal and for each snack time. Plan to have regular, balanced meals and snacks. Where to find more information American Diabetes Association: www.diabetes.org Centers for Disease Control and Prevention: www.cdc.gov Summary Carbohydrate counting is a method of keeping track of how many carbohydrates you eat. Eating carbohydrates naturally increases the amount of sugar (glucose) in the blood. Counting how many carbohydrates you eat improves your blood glucose control, which helps you manage your diabetes. A dietitian can help you make a meal plan and calculate how many carbohydrates you should have at each meal and snack. This information is not intended to replace advice given to you by your health care provider. Make sure you discuss any questions you have with your health care provider. Document Revised: 08/12/2019 Document Reviewed: 08/13/2019 Elsevier Patient Education  2021 Elsevier Inc.  

## 2021-05-15 ENCOUNTER — Other Ambulatory Visit: Payer: Self-pay

## 2021-05-16 ENCOUNTER — Other Ambulatory Visit: Payer: Self-pay

## 2021-06-14 ENCOUNTER — Other Ambulatory Visit: Payer: Self-pay

## 2021-07-24 ENCOUNTER — Other Ambulatory Visit: Payer: Self-pay

## 2021-08-08 ENCOUNTER — Ambulatory Visit: Payer: Medicaid Other | Admitting: Gerontology

## 2021-08-22 ENCOUNTER — Telehealth: Payer: Self-pay | Admitting: Pharmacy Technician

## 2021-08-22 NOTE — Telephone Encounter (Signed)
Per Roe Rutherford, patient is no longer a resident at [facility].  Patient made inactive at [facility].  Would need to provide updated poi to reflect current living situation.  Sherilyn Dacosta Care Manager [facility]

## 2021-08-30 ENCOUNTER — Ambulatory Visit: Payer: Medicaid Other | Admitting: Gerontology

## 2021-08-30 ENCOUNTER — Other Ambulatory Visit: Payer: Self-pay

## 2021-08-30 VITALS — BP 112/71 | HR 83 | Temp 97.3°F | Resp 16 | Ht 66.0 in | Wt 249.0 lb

## 2021-08-30 DIAGNOSIS — Z0000 Encounter for general adult medical examination without abnormal findings: Secondary | ICD-10-CM

## 2021-08-30 DIAGNOSIS — E119 Type 2 diabetes mellitus without complications: Secondary | ICD-10-CM

## 2021-08-30 LAB — POCT GLYCOSYLATED HEMOGLOBIN (HGB A1C): Hemoglobin A1C: 6.4 % — AB (ref 4.0–5.6)

## 2021-08-30 LAB — GLUCOSE, POCT (MANUAL RESULT ENTRY): POC Glucose: 84 mg/dl (ref 70–99)

## 2021-08-30 NOTE — Progress Notes (Signed)
Established Patient Office Visit  Subjective:  Patient ID: Stuart Rivers, male    DOB: 1990/11/05  Age: 31 y.o. MRN: [id number]  CC:  Chief Complaint  Patient presents with   Follow-up   Diabetes    HPI Stuart Rivers pr is a 31 y/o male who has a history of Anxiety, Depression and Type 2 diabetes presents for routine follow up, lab review and medication refill. His HgbA1c done on 08/30/21 was 6.4% and his blood glucose was 84 mg/dl. He states that he's compliant with his medications and continues to make healthy lifestyle changes. He checks his fasting blood glucose every other day and it's usually less than 130 mg/dl. Overall, he states that he's doing well and offers no further complaint.  Past Medical History:  Diagnosis Date   Anxiety    Depression    No pertinent past medical history     Past Surgical History:  Procedure Laterality Date   CLOSED REDUCTION NASAL FRACTURE  04/07/2012   Procedure: CLOSED REDUCTION NASAL FRACTURE;  Surgeon: Theodoro Kos, DO;  Location: [place];  Service: Plastics;  Laterality: N/A;   ORIF TIBIA FRACTURE  04/05/2012   Procedure: OPEN REDUCTION INTERNAL FIXATION (ORIF) TIBIA FRACTURE;  Surgeon: Mauri Pole, MD;  Location: [place];  Service: Orthopedics;  Laterality: Left;    Family History  Problem Relation Age of Onset   Arthritis Other        grandparent   Breast cancer Other    Heart disease Other        grandparent   Hypertension Other        grandparent   Diabetes Other        parent   Diabetes Other        grandparent    Social History   Socioeconomic History   Marital status: Single    Spouse name: Not on file   Number of children: Not on file   Years of education: Not on file   Highest education level: Not on file  Occupational History   Not on file  Tobacco Use   Smoking status: Some Days    Packs/day: 0.25    Types: Cigars, Cigarettes    Last attempt to quit: 05/26/2020    Years since quitting: 1.2    Smokeless tobacco: Never   Tobacco comments:    'maybe one on the weekends'  Vaping Use   Vaping Use: Former   Quit date: 09/18/2020   Substances: Nicotine  Substance and Sexual Activity   Alcohol use: Not Currently   Drug use: Not Currently    Types: Cocaine    Comment: no drugs since 04/16/2020   Sexual activity: Not Currently  Other Topics Concern   Not on file  Social History Narrative   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      Spiritual Beliefs: Christian      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, international aid - poor diet            Social Determinants of Radio broadcast assistant Strain: Not on file  Food Insecurity: No Food Insecurity   Worried About Charity fundraiser in the Last Year: Never true   Arboriculturist in the Last Year: Never true  Transportation Needs: No Transportation Needs   Lack of Transportation (Medical): No   Lack of Transportation (Non-Medical): No  Physical Activity: Not  on file  Stress: Not on file  Social Connections: Not on file  Intimate Partner Violence: Not At Risk   Fear of Current or Ex-Partner: No   Emotionally Abused: No   Physically Abused: No   Sexually Abused: No    Outpatient Medications Prior to Visit  Medication Sig Dispense Refill   acetaminophen (TYLENOL) 325 MG tablet Take 2 tablets (650 mg total) by mouth 2 (two) times daily. 30 tablet 0   blood glucose meter kit and supplies KIT Dispense based on patient and insurance preference. Use up to four times daily as directed. (FOR ICD-9 250.00, 250.01). 1 each 0   hydrOXYzine (VISTARIL) 25 MG capsule Take 1 capsule (25 mg total) by mouth 3 (three) times daily as needed for anxiety. 30 capsule 3   lamoTRIgine (LAMICTAL) 100 MG tablet TAKE ONE TABLET BY MOUTH TWICE DAILY. 60 tablet 4   metFORMIN (GLUCOPHAGE) 1000 MG tablet Take 1 tablet (1,000 mg total) by mouth 2 (two) times daily with a meal. 60 tablet 3   prazosin  (MINIPRESS) 1 MG capsule TAKE 3 CAPSULES BY MOUTH AT BEDTIME (Patient not taking: Reported on 05/09/2021) 90 capsule 0   No facility-administered medications prior to visit.    Allergies  Allergen Reactions   Other Hives    latex    ROS Review of Systems  Constitutional: Negative.   Eyes: Negative.   Respiratory: Negative.    Cardiovascular: Negative.   Endocrine: Negative.   Neurological: Negative.   Psychiatric/Behavioral: Negative.       Objective:    Physical Exam HENT:     Head: Normocephalic and atraumatic.     Mouth/Throat:     Mouth: Mucous membranes are moist.  Eyes:     Extraocular Movements: Extraocular movements intact.     Conjunctiva/sclera: Conjunctivae normal.     Pupils: Pupils are equal, round, and reactive to light.  Cardiovascular:     Rate and Rhythm: Normal rate and regular rhythm.     Pulses: Normal pulses.     Heart sounds: Normal heart sounds.  Pulmonary:     Effort: Pulmonary effort is normal.     Breath sounds: Normal breath sounds.  Skin:    General: Skin is warm.  Neurological:     General: No focal deficit present.     Mental Status: He is alert and oriented to person, place, and time. Mental status is at baseline.  Psychiatric:        Mood and Affect: Mood normal.        Behavior: Behavior normal.        Thought Content: Thought content normal.        Judgment: Judgment normal.    BP 112/71 (BP Location: Right Arm, Patient Position: Sitting, Cuff Size: Large)    Pulse 83    Temp (!) 97.3 F (36.3 C)    Resp 16    Ht 5' 6"  (1.676 m)    Wt 249 lb (112.9 kg)    SpO2 97%    BMI 40.19 kg/m  Wt Readings from Last 3 Encounters:  08/30/21 249 lb (112.9 kg)  05/09/21 256 lb 4.8 oz (116.3 kg)  05/02/21 257 lb (116.6 kg)   Weight loss encouraged  Health Maintenance Due  Topic Date Due   COVID-19 Vaccine (1) Never done   OPHTHALMOLOGY EXAM  Never done   URINE MICROALBUMIN  Never done   Pneumococcal Vaccine 24-76 Years old (2 - PCV)  04/07/2013    There  are no preventive care reminders to display for this patient.  Lab Results  Component Value Date   TSH 1.470 01/31/2021   Lab Results  Component Value Date   WBC 9.2 01/31/2021   HGB 15.8 01/31/2021   HCT 48.9 01/31/2021   MCV 83 01/31/2021   PLT 293 01/31/2021   Lab Results  Component Value Date   NA 140 01/31/2021   K 4.9 01/31/2021   CO2 21 01/31/2021   GLUCOSE 123 (H) 01/31/2021   BUN 12 01/31/2021   CREATININE 1.09 01/31/2021   BILITOT 0.2 01/31/2021   ALKPHOS 70 01/31/2021   AST 20 01/31/2021   ALT 22 01/31/2021   PROT 7.5 01/31/2021   ALBUMIN 4.5 01/31/2021   CALCIUM 10.3 (H) 01/31/2021   ANIONGAP 11 02/26/2019   EGFR 94 01/31/2021   Lab Results  Component Value Date   CHOL 204 (H) 01/31/2021   Lab Results  Component Value Date   HDL 40 01/31/2021   Lab Results  Component Value Date   LDLCALC 139 (H) 01/31/2021   Lab Results  Component Value Date   TRIG 140 01/31/2021   Lab Results  Component Value Date   CHOLHDL 5.1 (H) 01/31/2021   Lab Results  Component Value Date   HGBA1C 6.4 (A) 08/30/2021      Assessment & Plan:    1. Type 2 diabetes mellitus without complication, without long-term current use of insulin (HCC) -His HgbA1c was 6.4%, he will continue on current medication, low fat/cholesterol diet and exercise as tolerated. - POCT HgB A1C - POCT Glucose (CBG) - HgB A1c; Future  2. Health care maintenance -Routine labs will be checked. - TSH; Future - CBC w/Diff; Future - Comp Met (CMET); Future - Lipid panel; Future     Follow-up: Return in about 22 weeks (around 01/31/2022), or if symptoms worsen or fail to improve.    Tristine Langi Jerold Coombe, NP

## 2021-08-30 NOTE — Patient Instructions (Signed)

## 2021-09-19 ENCOUNTER — Other Ambulatory Visit: Payer: Self-pay

## 2021-09-20 ENCOUNTER — Other Ambulatory Visit: Payer: Self-pay

## 2021-09-24 ENCOUNTER — Other Ambulatory Visit: Payer: Self-pay

## 2021-09-26 ENCOUNTER — Other Ambulatory Visit: Payer: Self-pay

## 2021-10-11 ENCOUNTER — Other Ambulatory Visit: Payer: Self-pay

## 2021-10-11 ENCOUNTER — Encounter: Payer: Self-pay | Admitting: Gerontology

## 2021-10-11 ENCOUNTER — Ambulatory Visit: Payer: Medicaid Other | Admitting: Gerontology

## 2021-10-11 DIAGNOSIS — E119 Type 2 diabetes mellitus without complications: Secondary | ICD-10-CM

## 2021-10-11 DIAGNOSIS — B353 Tinea pedis: Secondary | ICD-10-CM | POA: Insufficient documentation

## 2021-10-11 MED ORDER — TERBINAFINE HCL 1 % EX CREA
1.0000 "application " | TOPICAL_CREAM | Freq: Two times a day (BID) | CUTANEOUS | 0 refills | Status: DC
  Filled 2021-10-11: qty 30, 15d supply, fill #0

## 2021-10-11 MED ORDER — METFORMIN HCL 1000 MG PO TABS
1000.0000 mg | ORAL_TABLET | Freq: Two times a day (BID) | ORAL | 4 refills | Status: DC
  Filled 2021-10-11: qty 60, 30d supply, fill #0
  Filled 2021-11-14: qty 180, 90d supply, fill #0
  Filled 2022-01-02 – 2022-01-30 (×2): qty 180, 90d supply, fill #1
  Filled 2022-01-31: qty 20, 10d supply, fill #0

## 2021-10-11 NOTE — Patient Instructions (Signed)

## 2021-10-11 NOTE — Progress Notes (Signed)
Established Patient Office Visit  Subjective:  Patient ID: Stuart Rivers, male    DOB: 01-03-1991  Age: 31 y.o. MRN: [id number]  CC:  Chief Complaint  Patient presents with   Foot Injury    Patient reports sore located between pinky toe and adjacent toe on right foot; pt reports pain level 7 out of 10.  Pt reported that he cannot recall when the sore started.    HPI Stuart Rivers is a 31 y/o male who has a history of Anxiety, Depression and Type 2 diabetes presents for c/o whitish colored ulcer in between  his right 4th and 5th toe that has being going on for more than 2 months. He states that lesion is not painful to touch , denies any discharge . He states that he's compliant with his medications, denies side effects and continues to make healthy lifestyle changes. Overall, he states that he's doing well and offers no further complaint.  Past Medical History:  Diagnosis Date   Anxiety    Depression    No pertinent past medical history     Past Surgical History:  Procedure Laterality Date   CLOSED REDUCTION NASAL FRACTURE  04/07/2012   Procedure: CLOSED REDUCTION NASAL FRACTURE;  Surgeon: Theodoro Kos, DO;  Location: [place];  Service: Plastics;  Laterality: N/A;   ORIF TIBIA FRACTURE  04/05/2012   Procedure: OPEN REDUCTION INTERNAL FIXATION (ORIF) TIBIA FRACTURE;  Surgeon: Mauri Pole, MD;  Location: [place];  Service: Orthopedics;  Laterality: Left;    Family History  Problem Relation Age of Onset   Arthritis Other        grandparent   Breast cancer Other    Heart disease Other        grandparent   Hypertension Other        grandparent   Diabetes Other        parent   Diabetes Other        grandparent    Social History   Socioeconomic History   Marital status: Single    Spouse name: Not on file   Number of children: Not on file   Years of education: Not on file   Highest education level: Not on file  Occupational History   Not on file   Tobacco Use   Smoking status: Some Days    Packs/day: 0.25    Types: Cigars, Cigarettes    Last attempt to quit: 05/26/2020    Years since quitting: 1.3   Smokeless tobacco: Never   Tobacco comments:    'maybe one on the weekends'  Vaping Use   Vaping Use: Former   Quit date: 09/18/2020   Substances: Nicotine  Substance and Sexual Activity   Alcohol use: Not Currently   Drug use: Not Currently    Types: Cocaine    Comment: no drugs since 04/16/2020   Sexual activity: Not Currently  Other Topics Concern   Not on file  Social History Narrative   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      Spiritual Beliefs: Christian      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, international aid - poor diet            Social Determinants of Radio broadcast assistant Strain: Not on file  Food Insecurity: No Food Insecurity   Worried About Charity fundraiser in the Last Year: Never true  Ran Out of Food in the Last Year: Never true  Transportation Needs: No Transportation Needs   Lack of Transportation (Medical): No   Lack of Transportation (Non-Medical): No  Physical Activity: Not on file  Stress: Not on file  Social Connections: Not on file  Intimate Partner Violence: Not At Risk   Fear of Current or Ex-Partner: No   Emotionally Abused: No   Physically Abused: No   Sexually Abused: No    Outpatient Medications Prior to Visit  Medication Sig Dispense Refill   acetaminophen (TYLENOL) 325 MG tablet Take 2 tablets (650 mg total) by mouth 2 (two) times daily. 30 tablet 0   blood glucose meter kit and supplies KIT Dispense based on patient and insurance preference. Use up to four times daily as directed. (FOR ICD-9 250.00, 250.01). 1 each 0   hydrOXYzine (VISTARIL) 25 MG capsule Take 1 capsule (25 mg total) by mouth 3 (three) times daily as needed for anxiety. 30 capsule 3   lamoTRIgine (LAMICTAL) 100 MG tablet TAKE ONE TABLET BY MOUTH TWICE  DAILY. 60 tablet 4   metFORMIN (GLUCOPHAGE) 1000 MG tablet Take 1 tablet (1,000 mg total) by mouth 2 (two) times daily with a meal. 60 tablet 3   No facility-administered medications prior to visit.    Allergies  Allergen Reactions   Other Hives    latex    ROS Review of Systems  Constitutional: Negative.   Eyes: Negative.   Respiratory: Negative.    Cardiovascular: Negative.   Skin:        Whitish colored lesion in between 4th & 5th toe of right foot.  Neurological: Negative.      Objective:    Physical Exam HENT:     Head: Normocephalic and atraumatic.  Cardiovascular:     Rate and Rhythm: Normal rate and regular rhythm.     Pulses: Normal pulses.     Heart sounds: Normal heart sounds.  Pulmonary:     Effort: Pulmonary effort is normal.     Breath sounds: Normal breath sounds.  Musculoskeletal:        General: Normal range of motion.  Skin:    Findings: Lesion present.       Neurological:     General: No focal deficit present.     Mental Status: He is alert and oriented to person, place, and time. Mental status is at baseline.    BP 112/77 (BP Location: Right Arm, Patient Position: Sitting, Cuff Size: Large)    Pulse 80    Temp 98.3 F (36.8 C) (Oral)    Wt 244 lb 4.8 oz (110.8 kg)    SpO2 95%    BMI 39.43 kg/m  Wt Readings from Last 3 Encounters:  10/11/21 244 lb 4.8 oz (110.8 kg)  08/30/21 249 lb (112.9 kg)  05/09/21 256 lb 4.8 oz (116.3 kg)   He lost 12 pounds was encouraged to continue on weight loss regimen  Health Maintenance Due  Topic Date Due   COVID-19 Vaccine (1) Never done   OPHTHALMOLOGY EXAM  Never done   URINE MICROALBUMIN  Never done    There are no preventive care reminders to display for this patient.  Lab Results  Component Value Date   TSH 1.470 01/31/2021   Lab Results  Component Value Date   WBC 9.2 01/31/2021   HGB 15.8 01/31/2021   HCT 48.9 01/31/2021   MCV 83 01/31/2021   PLT 293 01/31/2021   Lab Results   Component  Value Date   NA 140 01/31/2021   K 4.9 01/31/2021   CO2 21 01/31/2021   GLUCOSE 123 (H) 01/31/2021   BUN 12 01/31/2021   CREATININE 1.09 01/31/2021   BILITOT 0.2 01/31/2021   ALKPHOS 70 01/31/2021   AST 20 01/31/2021   ALT 22 01/31/2021   PROT 7.5 01/31/2021   ALBUMIN 4.5 01/31/2021   CALCIUM 10.3 (H) 01/31/2021   ANIONGAP 11 02/26/2019   EGFR 94 01/31/2021   Lab Results  Component Value Date   CHOL 204 (H) 01/31/2021   Lab Results  Component Value Date   HDL 40 01/31/2021   Lab Results  Component Value Date   LDLCALC 139 (H) 01/31/2021   Lab Results  Component Value Date   TRIG 140 01/31/2021   Lab Results  Component Value Date   CHOLHDL 5.1 (H) 01/31/2021   Lab Results  Component Value Date   HGBA1C 6.4 (A) 08/30/2021      Assessment & Plan:    1. Type 2 diabetes mellitus without complication, without long-term current use of insulin (HCC) - His diabetes is improving, he will continue on current medication, low fat/ non concentrated sweet diet and exercise as tolerated. - metFORMIN (GLUCOPHAGE) 1000 MG tablet; Take 1 tablet (1,000 mg total) by mouth 2 (two) times daily with a meal.  Dispense: 60 tablet; Refill: 4  2. Athlete's foot on right -Possible Athletes foot, he was started on Terbinafine bid, was educated on medication side effects and advised to notify clinic. He was advised to notify clinic if symptoms worsens. - terbinafine (LAMISIL) 1 % cream; Apply 1 application topically 2 (two) times daily.  Dispense: 30 g; Refill: 0     Follow-up: Return in about 1 month (around 11/08/2021).    Quince Santana Jerold Coombe, NP

## 2021-10-12 ENCOUNTER — Other Ambulatory Visit: Payer: Self-pay

## 2021-10-25 ENCOUNTER — Emergency Department (HOSPITAL_COMMUNITY)
Admission: EM | Admit: 2021-10-25 | Discharge: 2021-10-26 | Disposition: A | Discharge status: Home / Self Care | Payer: Medicaid Other | Attending: Emergency Medicine | Admitting: Emergency Medicine

## 2021-10-25 ENCOUNTER — Other Ambulatory Visit: Payer: Self-pay

## 2021-10-25 ENCOUNTER — Emergency Department (HOSPITAL_COMMUNITY): Payer: Medicaid Other

## 2021-10-25 ENCOUNTER — Encounter (HOSPITAL_COMMUNITY): Payer: Self-pay | Admitting: Emergency Medicine

## 2021-10-25 DIAGNOSIS — Z5321 Procedure and treatment not carried out due to patient leaving prior to being seen by health care provider: Secondary | ICD-10-CM | POA: Insufficient documentation

## 2021-10-25 DIAGNOSIS — M79669 Pain in unspecified lower leg: Secondary | ICD-10-CM | POA: Insufficient documentation

## 2021-10-25 DIAGNOSIS — R200 Anesthesia of skin: Secondary | ICD-10-CM | POA: Insufficient documentation

## 2021-10-25 DIAGNOSIS — R202 Paresthesia of skin: Secondary | ICD-10-CM | POA: Insufficient documentation

## 2021-10-25 DIAGNOSIS — R079 Chest pain, unspecified: Secondary | ICD-10-CM | POA: Insufficient documentation

## 2021-10-25 HISTORY — DX: Type 2 diabetes mellitus without complications: E11.9

## 2021-10-25 LAB — BASIC METABOLIC PANEL
Anion gap: 12 (ref 5–15)
BUN: 17 mg/dL (ref 6–20)
CO2: 23 mmol/L (ref 22–32)
Calcium: 9.7 mg/dL (ref 8.9–10.3)
Chloride: 103 mmol/L (ref 98–111)
Creatinine, Ser: 1.23 mg/dL (ref 0.61–1.24)
GFR, Estimated: >60 mL/min (ref >60)
Glucose, Bld: 143 mg/dL — ABNORMAL HIGH (ref 70–99)
Potassium: 3.5 mmol/L (ref 3.5–5.1)
Sodium: 138 mmol/L (ref 135–145)

## 2021-10-25 LAB — CBC
HCT: 50 % (ref 39.0–52.0)
Hemoglobin: 16.1 g/dL (ref 13.0–17.0)
MCH: 27.9 pg (ref 26.0–34.0)
MCHC: 32.2 g/dL (ref 30.0–36.0)
MCV: 86.5 fL (ref 80.0–100.0)
Platelets: 234 10*3/uL (ref 150–400)
RBC: 5.78 MIL/uL (ref 4.22–5.81)
RDW: 13.7 % (ref 11.5–15.5)
WBC: 11.6 10*3/uL — ABNORMAL HIGH (ref 4.0–10.5)
nRBC: 0 % (ref 0.0–0.2)

## 2021-10-25 LAB — TROPONIN I (HIGH SENSITIVITY): Troponin I (High Sensitivity): 9 ng/L (ref <18)

## 2021-10-25 NOTE — ED Triage Notes (Signed)
Pt here from home for L side numbness that started "sometime today", pt reports feeling lightheaded and "mild" L side chest pain, denies dizziness and shob. Upon neuro exam, pt sensation is intact. Pt ambulatory in triage. ?

## 2021-10-25 NOTE — ED Provider Triage Note (Signed)
Emergency Medicine Provider Triage Evaluation Note ? ?Stuart Rivers , a 31 y.o. male  was evaluated in triage.  Pt complains of chest pain and leg pain since earlier today.  Started with leg pain migrated up to the chest.  Numbness and tingling on the left side of his body.  Admits to marijuana and cocaine use in the last 24 hours.  Denies IV drug use ? ?Review of Systems  ?Positive: Chest pain, leg pain, numbness ?Negative: SOB ? ?Physical Exam  ?BP (!) 157/103 (BP Location: Right Arm)   Pulse (!) 114   Temp 98.4 ?F (36.9 ?C) (Oral)   Resp 20   SpO2 97%  ?Gen:   Awake, no distress   ?Resp:  Normal effort, CTAB ?MSK:   Moves extremities without difficulty  ?Other:  Chest pain localized to upper left and reproducible.  Negative neuro exam. ? ?Medical Decision Making  ?Medically screening exam initiated at 9:02 PM.  Appropriate orders placed.  Stuart Rivers was informed that the remainder of the evaluation will be completed by another provider, this initial triage assessment does not replace that evaluation, and the importance of remaining in the ED until their evaluation is complete. ? ?Labs, imaging, EKG ?  ?Cecil Cobbs, PA-C ?10/25/21 2112 ? ?

## 2021-10-26 LAB — TROPONIN I (HIGH SENSITIVITY): Troponin I (High Sensitivity): 9 ng/L (ref <18)

## 2021-10-26 NOTE — ED Notes (Signed)
Patient called x3 for vitals with no result ?

## 2021-11-08 ENCOUNTER — Ambulatory Visit: Payer: Medicaid Other | Admitting: Gerontology

## 2021-11-08 ENCOUNTER — Other Ambulatory Visit: Payer: Self-pay

## 2021-11-08 ENCOUNTER — Encounter: Payer: Self-pay | Admitting: Gerontology

## 2021-11-08 VITALS — BP 132/79 | HR 84 | Temp 97.8°F | Resp 16 | Ht 66.0 in | Wt 236.1 lb

## 2021-11-08 MED ORDER — TERBINAFINE HCL 1 % EX CREA
1.0000 "application " | TOPICAL_CREAM | Freq: Two times a day (BID) | CUTANEOUS | 0 refills | Status: DC
  Filled 2021-11-08: qty 30, 15d supply, fill #0

## 2021-11-08 NOTE — Patient Instructions (Signed)

## 2021-11-08 NOTE — Progress Notes (Signed)
? ?Established Patient Office Visit ? ?Subjective:  ?Patient ID: Stuart Rivers, male    DOB: 06/20/1991  Age: 31 y.o. MRN: [id number] ? ?CC:  ?Chief Complaint  ?Patient presents with  ? Follow-up  ?  Foot problem. Currently being treated with Lamisil  ? ?HPI ?Stuart Rivers is a 31 y/o male who has a history of Anxiety, Depression and Type 2 diabetes presents for f/u with the 5th toe pain. He stated that  the toe hurts when he touches it and  moves it around and the pain is at 8/10 on pain scale. He continues to use Terbinafine cream between the 4th and 5th toe with 80% improvement. He  states that he's compliant with his medication and not making healthy life styles choices. His HgbA1c done on 08/30/21 was 6.4%.  He states that he checks his blood glucose every other day and reading after breakfast  today was 127 mg/dl. Random blood sugar check during this visit was 88 mg /dl.  He denies hypo/ hyperglycemia, peripheral neuropathy and performs daily foot checks at home. He went to the ED on 10/25/21 for c/o chest pain, numbness and tingling to left side of his body, but he left AMA. He denies any other complaint and states that he is doing well overall. ? ?Past Medical History:  ?Diagnosis Date  ? Anxiety   ? Depression   ? Diabetes mellitus without complication ([place])   ? No pertinent past medical history   ? ? ?Past Surgical History:  ?Procedure Laterality Date  ? CLOSED REDUCTION NASAL FRACTURE  04/07/2012  ? Procedure: CLOSED REDUCTION NASAL FRACTURE;  Surgeon: Theodoro Kos, DO;  Location: [place];  Service: Plastics;  Laterality: N/A;  ? ORIF TIBIA FRACTURE  04/05/2012  ? Procedure: OPEN REDUCTION INTERNAL FIXATION (ORIF) TIBIA FRACTURE;  Surgeon: Mauri Pole, MD;  Location: [place];  Service: Orthopedics;  Laterality: Left;  ? ? ?Family History  ?Problem Relation Age of Onset  ? Anxiety disorder Mother   ? Diabetes Mother   ? Other Father   ?     unknown medical history  ? Arthritis Other   ?      grandparent  ? Breast cancer Other   ? Heart disease Other   ?     grandparent  ? Hypertension Other   ?     grandparent  ? Diabetes Other   ?     parent  ? Diabetes Other   ?     grandparent  ? ? ?Social History  ? ?Socioeconomic History  ? Marital status: Single  ?  Spouse name: Not on file  ? Number of children: Not on file  ? Years of education: Not on file  ? Highest education level: Not on file  ?Occupational History  ? Not on file  ?Tobacco Use  ? Smoking status: Some Days  ?  Packs/day: 0.25  ?  Types: Cigars, Cigarettes  ?  Last attempt to quit: 05/26/2020  ?  Years since quitting: 1.4  ? Smokeless tobacco: Never  ? Tobacco comments:  ?  'maybe one on the weekends'  ?Vaping Use  ? Vaping Use: Former  ? Quit date: 09/18/2020  ? Substances: Nicotine  ?Substance and Sexual Activity  ? Alcohol use: Not Currently  ? Drug use: Not Currently  ?  Types: Cocaine, Marijuana  ?  Comment: last use 10/26/21  ? Sexual activity: Not Currently  ?Other Topics Concern  ? Not on  file  ?Social History Narrative  ? Work or School: landscaping  ?   ? Home Situation: lives with grandparents  ?   ? Spiritual Beliefs: Christian  ?   ? 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, international aid - poor diet  ?   ?   ?   ? ?Social Determinants of Health  ? ?Financial Resource Strain: Not on file  ?Food Insecurity: No Food Insecurity  ? Worried About Charity fundraiser in the Last Year: Never true  ? Ran Out of Food in the Last Year: Never true  ?Transportation Needs: No Transportation Needs  ? Lack of Transportation (Medical): No  ? Lack of Transportation (Non-Medical): No  ?Physical Activity: Not on file  ?Stress: Not on file  ?Social Connections: Not on file  ?Intimate Partner Violence: Not on file  ? ? ?Outpatient Medications Prior to Visit  ?Medication Sig Dispense Refill  ? acetaminophen (TYLENOL) 325 MG tablet Take 2 tablets (650 mg total) by mouth 2 (two) times daily. 30 tablet 0  ? blood glucose meter kit and supplies KIT Dispense  based on patient and insurance preference. Use up to four times daily as directed. (FOR ICD-9 250.00, 250.01). 1 each 0  ? hydrOXYzine (VISTARIL) 25 MG capsule Take 1 capsule (25 mg total) by mouth 3 (three) times daily as needed for anxiety. 30 capsule 3  ? lamoTRIgine (LAMICTAL) 100 MG tablet TAKE ONE TABLET BY MOUTH TWICE DAILY. 60 tablet 4  ? metFORMIN (GLUCOPHAGE) 1000 MG tablet Take 1 tablet (1,000 mg total) by mouth 2 (two) times daily with a meal. 60 tablet 4  ? terbinafine (LAMISIL) 1 % cream Apply 1 application topically to the affected area 2 (two) times daily. 30 g 0  ? ?No facility-administered medications prior to visit.  ? ? ?Allergies  ?Allergen Reactions  ? Other Hives  ?  latex  ? ? ?ROS ?Review of Systems  ?Constitutional: Negative.   ?HENT: Negative.    ?Eyes: Negative.   ?Respiratory: Negative.    ?Gastrointestinal: Negative.   ?Endocrine: Negative.   ?Musculoskeletal: Negative.   ?Skin: Negative.   ?Neurological: Negative.   ?Psychiatric/Behavioral: Negative.    ? ?  ?Objective:  ?  ?Physical Exam ?Vitals reviewed.  ?Constitutional:   ?   Appearance: Normal appearance.  ?Cardiovascular:  ?   Rate and Rhythm: Normal rate and regular rhythm.  ?Abdominal:  ?   Palpations: Abdomen is soft.  ?Musculoskeletal:     ?   General: Normal range of motion.  ?   Cervical back: Normal range of motion.  ?Skin: ?   General: Skin is warm and dry.  ?Neurological:  ?   Mental Status: He is alert and oriented to person, place, and time.  ?Psychiatric:     ?   Mood and Affect: Mood normal.  ? ? ?BP 132/79 (BP Location: Right Arm, Patient Position: Sitting, Cuff Size: Large)   Pulse 84   Temp 97.8 ?F (36.6 ?C) (Oral)   Resp 16   Ht 5' 6"  (1.676 m)   Wt 236 lb 1.6 oz (107.1 kg)   SpO2 97%   BMI 38.11 kg/m?  ?Wt Readings from Last 3 Encounters:  ?11/08/21 236 lb 1.6 oz (107.1 kg)  ?10/11/21 244 lb 4.8 oz (110.8 kg)  ?08/30/21 249 lb (112.9 kg)  ? ?He lost 8 pounds in 1 month and was encouraged to continue  weight loss regimen. ? ? ?Health Maintenance Due  ?Topic Date Due  ?  COVID-19 Vaccine (1) Never done  ? OPHTHALMOLOGY EXAM  Never done  ? URINE MICROALBUMIN  Never done  ? ? ?There are no preventive care reminders to display for this patient. ? ?Lab Results  ?Component Value Date  ? TSH 1.470 01/31/2021  ? ?Lab Results  ?Component Value Date  ? WBC 11.6 (H) 10/25/2021  ? HGB 16.1 10/25/2021  ? HCT 50.0 10/25/2021  ? MCV 86.5 10/25/2021  ? PLT 234 10/25/2021  ? ?Lab Results  ?Component Value Date  ? NA 138 10/25/2021  ? K 3.5 10/25/2021  ? CO2 23 10/25/2021  ? GLUCOSE 143 (H) 10/25/2021  ? BUN 17 10/25/2021  ? CREATININE 1.23 10/25/2021  ? BILITOT 0.2 01/31/2021  ? ALKPHOS 70 01/31/2021  ? AST 20 01/31/2021  ? ALT 22 01/31/2021  ? PROT 7.5 01/31/2021  ? ALBUMIN 4.5 01/31/2021  ? CALCIUM 9.7 10/25/2021  ? ANIONGAP 12 10/25/2021  ? EGFR 94 01/31/2021  ? ?Lab Results  ?Component Value Date  ? CHOL 204 (H) 01/31/2021  ? ?Lab Results  ?Component Value Date  ? HDL 40 01/31/2021  ? ?Lab Results  ?Component Value Date  ? LDLCALC 139 (H) 01/31/2021  ? ?Lab Results  ?Component Value Date  ? TRIG 140 01/31/2021  ? ?Lab Results  ?Component Value Date  ? CHOLHDL 5.1 (H) 01/31/2021  ? ?Lab Results  ?Component Value Date  ? HGBA1C 6.4 (A) 08/30/2021  ? ? ?  ?Assessment & Plan:  ?1. Athlete's foot on right ?The right 4th and 5th toes  responding well to treatment ?Continue present cream ?- terbinafine (LAMISIL) 1 % cream; Apply 1 application topically to the affected area 2 (two) times daily.   ? ?2. Type 2 diabetes mellitus without complication, without long-term current use of insulin ([place]) ?His diabetes is improving, he will continue on current medication, low carb/ non concentrated sweet diet and exercise as tolerated. ?- metFORMIN (GLUCOPHAGE) 1000 MG tablet; Take 1 tablet (1,000 mg total) by mouth 2 (two) times daily with a meal ? ? ?Follow-up: in the clinic 3 months (around 01/31/22) if symptoms worsen or fail to  improve. ? ? ?[place] Jerold Coombe, NP ?

## 2021-11-12 IMAGING — CR DG HIP (WITH OR WITHOUT PELVIS) 2-3V*R*
1 series · 3 of 3 positions shown · non-contrast
Comparison: CT abdomen pelvis dated 03/22/2017.

CLINICAL DATA: 29-year-old male with right hip pain.

EXAM:
DG HIP (WITH OR WITHOUT PELVIS) 2-3V RIGHT

[Series 1: t pelvis ap · 0.14mm/px · 3 of 3 slices shown]
[im 1/3]
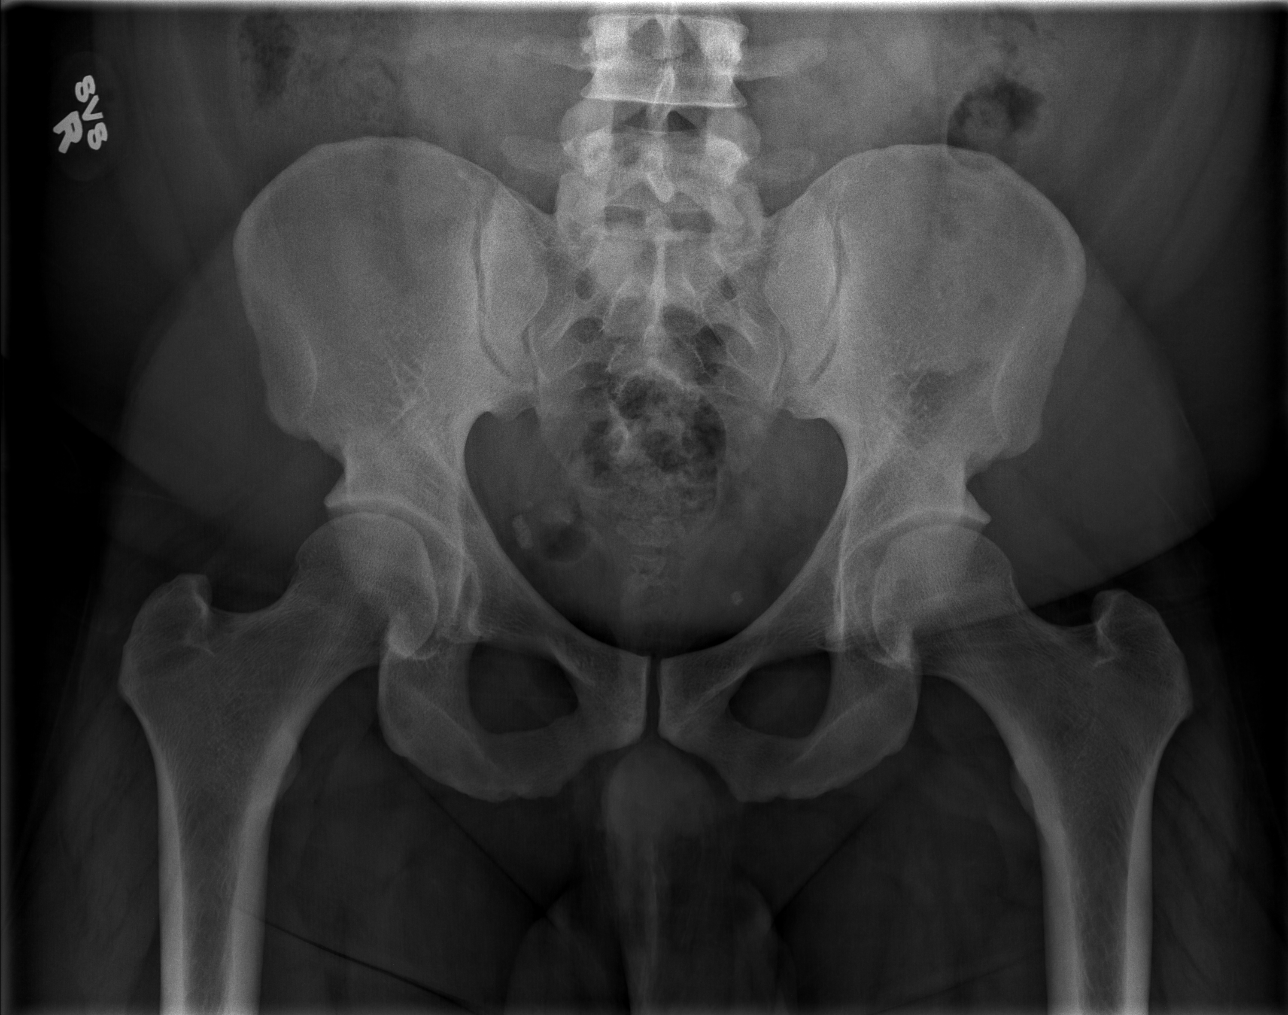
[im 2/3]
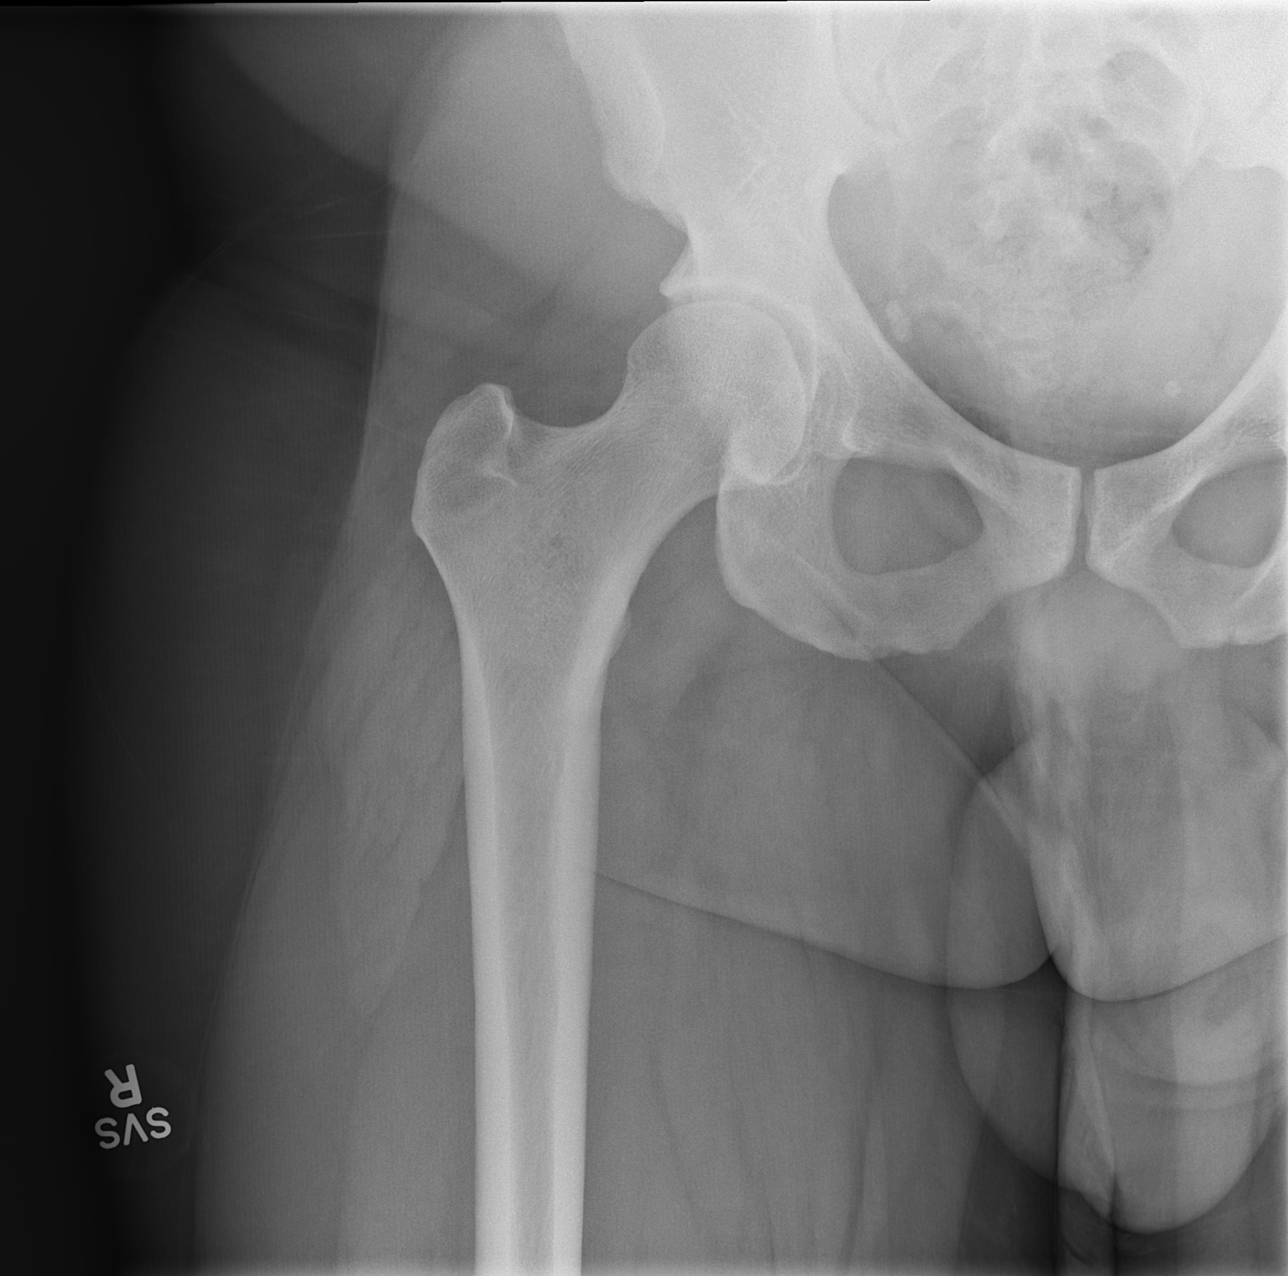
[im 3/3]
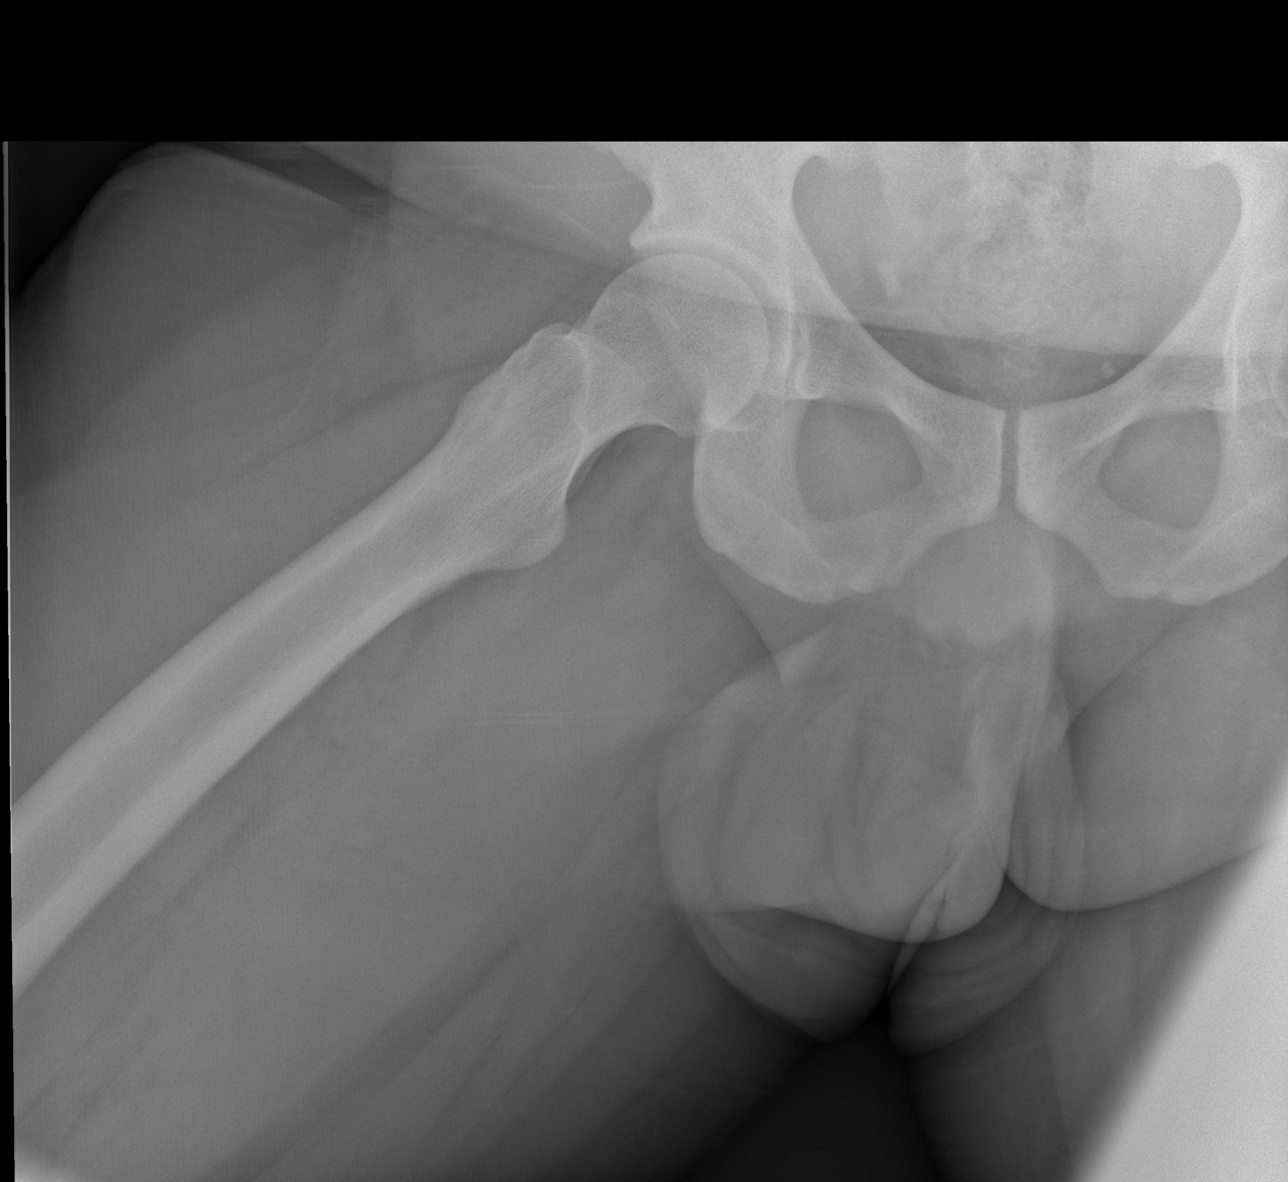

[3 of 3 positions shown; findings below may reference images not displayed]

FINDINGS: There is no evidence of hip fracture or dislocation. There is no
evidence of arthropathy or other focal bone abnormality.
IMPRESSION: Negative.

## 2021-11-14 ENCOUNTER — Other Ambulatory Visit: Payer: Self-pay

## 2021-11-15 ENCOUNTER — Other Ambulatory Visit: Payer: Self-pay

## 2022-01-02 ENCOUNTER — Other Ambulatory Visit: Payer: Self-pay | Admitting: Gerontology

## 2022-01-02 ENCOUNTER — Other Ambulatory Visit: Payer: Self-pay

## 2022-01-02 DIAGNOSIS — B353 Tinea pedis: Secondary | ICD-10-CM

## 2022-01-03 ENCOUNTER — Other Ambulatory Visit: Payer: Self-pay

## 2022-01-03 MED ORDER — TERBINAFINE HCL 1 % EX CREA
1.0000 "application " | TOPICAL_CREAM | Freq: Two times a day (BID) | CUTANEOUS | 0 refills | Status: DC
  Filled 2022-01-03: qty 30, 15d supply, fill #0

## 2022-01-04 ENCOUNTER — Other Ambulatory Visit: Payer: Self-pay

## 2022-01-08 ENCOUNTER — Other Ambulatory Visit: Payer: Self-pay

## 2022-01-09 ENCOUNTER — Other Ambulatory Visit: Payer: Self-pay

## 2022-01-11 ENCOUNTER — Other Ambulatory Visit: Payer: Self-pay

## 2022-01-16 ENCOUNTER — Other Ambulatory Visit: Payer: Self-pay

## 2022-01-18 ENCOUNTER — Other Ambulatory Visit: Payer: Self-pay

## 2022-01-24 ENCOUNTER — Other Ambulatory Visit: Payer: Medicaid Other

## 2022-01-24 DIAGNOSIS — E119 Type 2 diabetes mellitus without complications: Secondary | ICD-10-CM

## 2022-01-24 DIAGNOSIS — Z0000 Encounter for general adult medical examination without abnormal findings: Secondary | ICD-10-CM

## 2022-01-25 LAB — LIPID PANEL
Chol/HDL Ratio: 4.1 ratio (ref 0.0–5.0)
Cholesterol, Total: 171 mg/dL (ref 100–199)
HDL: 42 mg/dL (ref >39)
LDL Chol Calc (NIH): 110 mg/dL — ABNORMAL HIGH (ref 0–99)
Triglycerides: 104 mg/dL (ref 0–149)
VLDL Cholesterol Cal: 19 mg/dL (ref 5–40)

## 2022-01-25 LAB — CBC WITH DIFFERENTIAL/PLATELET
Basophils Absolute: 0.1 10*3/uL (ref 0.0–0.2)
Basos: 1 %
EOS (ABSOLUTE): 0.1 10*3/uL (ref 0.0–0.4)
Eos: 1 %
Hematocrit: 46.5 % (ref 37.5–51.0)
Hemoglobin: 15.3 g/dL (ref 13.0–17.7)
Immature Grans (Abs): 0 10*3/uL (ref 0.0–0.1)
Immature Granulocytes: 0 %
Lymphocytes Absolute: 3.1 10*3/uL (ref 0.7–3.1)
Lymphs: 38 %
MCH: 28.2 pg (ref 26.6–33.0)
MCHC: 32.9 g/dL (ref 31.5–35.7)
MCV: 86 fL (ref 79–97)
Monocytes Absolute: 0.6 10*3/uL (ref 0.1–0.9)
Monocytes: 7 %
Neutrophils Absolute: 4.4 10*3/uL (ref 1.4–7.0)
Neutrophils: 53 %
Platelets: 232 10*3/uL (ref 150–450)
RBC: 5.43 x10E6/uL (ref 4.14–5.80)
RDW: 13.2 % (ref 11.6–15.4)
WBC: 8.2 10*3/uL (ref 3.4–10.8)

## 2022-01-25 LAB — COMPREHENSIVE METABOLIC PANEL
ALT: 17 IU/L (ref 0–44)
AST: 14 IU/L (ref 0–40)
Albumin/Globulin Ratio: 2 (ref 1.2–2.2)
Albumin: 4.6 g/dL (ref 4.1–5.2)
Alkaline Phosphatase: 70 IU/L (ref 44–121)
BUN/Creatinine Ratio: 12 (ref 9–20)
BUN: 12 mg/dL (ref 6–20)
Bilirubin Total: 0.3 mg/dL (ref 0.0–1.2)
CO2: 22 mmol/L (ref 20–29)
Calcium: 9.6 mg/dL (ref 8.7–10.2)
Chloride: 104 mmol/L (ref 96–106)
Creatinine, Ser: 1.01 mg/dL (ref 0.76–1.27)
Globulin, Total: 2.3 g/dL (ref 1.5–4.5)
Glucose: 213 mg/dL — ABNORMAL HIGH (ref 70–99)
Potassium: 3.9 mmol/L (ref 3.5–5.2)
Sodium: 142 mmol/L (ref 134–144)
Total Protein: 6.9 g/dL (ref 6.0–8.5)
eGFR: 103 mL/min/{1.73_m2} (ref >59)

## 2022-01-25 LAB — HEMOGLOBIN A1C
Est. average glucose Bld gHb Est-mCnc: 126 mg/dL
Hgb A1c MFr Bld: 6 % — ABNORMAL HIGH (ref 4.8–5.6)

## 2022-01-25 LAB — TSH: TSH: 0.855 u[IU]/mL (ref 0.450–4.500)

## 2022-01-30 ENCOUNTER — Other Ambulatory Visit: Payer: Self-pay

## 2022-01-30 ENCOUNTER — Other Ambulatory Visit: Payer: Self-pay | Admitting: Gerontology

## 2022-01-30 DIAGNOSIS — B353 Tinea pedis: Secondary | ICD-10-CM

## 2022-01-30 MED ORDER — TERBINAFINE HCL 1 % EX CREA
1.0000 "application " | TOPICAL_CREAM | Freq: Two times a day (BID) | CUTANEOUS | 0 refills | Status: DC
  Filled 2022-01-30 – 2022-01-31 (×2): qty 30, 15d supply, fill #0

## 2022-01-31 ENCOUNTER — Other Ambulatory Visit: Payer: Self-pay

## 2022-01-31 ENCOUNTER — Ambulatory Visit: Payer: Medicaid Other | Admitting: Gerontology

## 2022-01-31 VITALS — BP 109/69 | HR 91 | Temp 98.0°F | Resp 16 | Ht 66.0 in | Wt 222.0 lb

## 2022-01-31 DIAGNOSIS — E119 Type 2 diabetes mellitus without complications: Secondary | ICD-10-CM

## 2022-01-31 DIAGNOSIS — R0789 Other chest pain: Secondary | ICD-10-CM | POA: Insufficient documentation

## 2022-01-31 MED ORDER — METFORMIN HCL 1000 MG PO TABS
1000.0000 mg | ORAL_TABLET | Freq: Two times a day (BID) | ORAL | 4 refills | Status: DC
  Filled 2022-01-31: qty 60, 30d supply, fill #0
  Filled 2022-03-01: qty 60, 30d supply, fill #1
  Filled 2022-05-29: qty 60, 30d supply, fill #2

## 2022-01-31 NOTE — Progress Notes (Signed)
Established Patient Office Visit  Subjective   Patient ID: Stuart Rivers, male    DOB: 03-05-1991  Age: 31 y.o. MRN: [id number]  Chief Complaint  Patient presents with   Follow-up    1 - something on toe 2 - part of foot that is hurting  3 - chest pain (tightness) - left shoulder    HPI  Stuart Rivers is a 31 y/o male who has a history of Anxiety, Depression and Type 2 diabetes presents  for lab review and for c/o intermittent non radiating  dull chest tightness/pain that has been going on for more than 1 month. He  states that pain is at both right and left chest area, states that pain is intermittent, He states that activities such as exercise aggravates symptoms , and he states that pain resolves within 5 minutes. He reports 1-2 episodes of chest pain daily States that pain is sometimes associated with shortness of breath, denies dizziness, nausea/vomiting.He states that pain intensity increases to 8/10. He reports an episode of chest discomfort at 4 pm  when he was getting out of the shower after exercising. He denied any associated symptoms with. He was seen at the ED on 10/26/21 for chest pain but he left AMA. His HgbA1c decreased from 6.4% to 6%. Overall, he states that he is doing well, but concerned about the chest pain.  Review of Systems  Constitutional: Negative.   Respiratory: Negative.    Cardiovascular: Negative.   Neurological: Negative.   Psychiatric/Behavioral: Negative.        Objective:     BP 109/69 (BP Location: Left Arm, Patient Position: Sitting, Cuff Size: Large)   Pulse 91   Temp 98 F (36.7 C) (Oral)   Resp 16   Ht 5' 6"  (1.676 m)   Wt 222 lb (100.7 kg)   SpO2 95%   BMI 35.83 kg/m  BP Readings from Last 3 Encounters:  01/31/22 109/69  11/08/21 132/79  10/26/21 126/88   Wt Readings from Last 3 Encounters:  01/31/22 222 lb (100.7 kg)  11/08/21 236 lb 1.6 oz (107.1 kg)  10/11/21 244 lb 4.8 oz (110.8 kg)      Physical  Exam HENT:     Head: Normocephalic and atraumatic.     Mouth/Throat:     Mouth: Mucous membranes are moist.  Eyes:     Extraocular Movements: Extraocular movements intact.     Conjunctiva/sclera: Conjunctivae normal.     Pupils: Pupils are equal, round, and reactive to light.  Cardiovascular:     Rate and Rhythm: Normal rate and regular rhythm.     Pulses: Normal pulses.     Heart sounds: Normal heart sounds.  Pulmonary:     Effort: Pulmonary effort is normal.     Breath sounds: Normal breath sounds.  Musculoskeletal:        General: Tenderness (with palpation to chest area) present.  Skin:    General: Skin is warm.  Neurological:     General: No focal deficit present.     Mental Status: He is alert and oriented to person, place, and time. Mental status is at baseline.  Psychiatric:        Mood and Affect: Mood normal.        Behavior: Behavior normal.        Thought Content: Thought content normal.        Judgment: Judgment normal.      No results found for any visits  on 01/31/22.  Last CBC Lab Results  Component Value Date   WBC 8.2 01/24/2022   HGB 15.3 01/24/2022   HCT 46.5 01/24/2022   MCV 86 01/24/2022   MCH 28.2 01/24/2022   RDW 13.2 01/24/2022   PLT 232 35/68/6168   Last metabolic panel Lab Results  Component Value Date   GLUCOSE 213 (H) 01/24/2022   NA 142 01/24/2022   K 3.9 01/24/2022   CL 104 01/24/2022   CO2 22 01/24/2022   BUN 12 01/24/2022   CREATININE 1.01 01/24/2022   EGFR 103 01/24/2022   CALCIUM 9.6 01/24/2022   PROT 6.9 01/24/2022   ALBUMIN 4.6 01/24/2022   LABGLOB 2.3 01/24/2022   AGRATIO 2.0 01/24/2022   BILITOT 0.3 01/24/2022   ALKPHOS 70 01/24/2022   AST 14 01/24/2022   ALT 17 01/24/2022   ANIONGAP 12 10/25/2021   Last lipids Lab Results  Component Value Date   CHOL 171 01/24/2022   HDL 42 01/24/2022   LDLCALC 110 (H) 01/24/2022   TRIG 104 01/24/2022   CHOLHDL 4.1 01/24/2022   Last hemoglobin A1c Lab Results   Component Value Date   HGBA1C 6.0 (H) 01/24/2022   Last thyroid functions Lab Results  Component Value Date   TSH 0.855 01/24/2022      The ASCVD Risk score (Arnett DK, et al., 2019) failed to calculate for the following reasons:   The 2019 ASCVD risk score is only valid for ages 19 to 19    Assessment & Plan:   1. Type 2 diabetes mellitus without complication, without long-term current use of insulin (HCC) - His diabetes is under control, HgbA1c was 6%, he will continue on current medication, low carb/non concentrated sweet diet and exercise as tolerated. - metFORMIN (GLUCOPHAGE) 1000 MG tablet; Take 1 tablet (1,000 mg total) by mouth 2 (two) times daily with a meal.  Dispense: 60 tablet; Refill: 4  2. Chest discomfort - Possible muscle strain, was advised to go to the ED.   Return in about 13 weeks (around 05/02/2022), or if symptoms worsen or fail to improve.    Stuart [place] Jerold Coombe, NP

## 2022-02-01 ENCOUNTER — Other Ambulatory Visit: Payer: Self-pay

## 2022-02-04 ENCOUNTER — Other Ambulatory Visit: Payer: Self-pay

## 2022-02-04 MED ORDER — HYDROXYZINE PAMOATE 25 MG PO CAPS
ORAL_CAPSULE | ORAL | 1 refills | Status: DC
  Filled 2022-02-04: qty 30, 30d supply, fill #0
  Filled 2022-03-01: qty 30, 10d supply, fill #1

## 2022-02-04 MED ORDER — LAMOTRIGINE 100 MG PO TABS
100.0000 mg | ORAL_TABLET | Freq: Two times a day (BID) | ORAL | 1 refills | Status: DC
  Filled 2022-02-04: qty 60, 30d supply, fill #0
  Filled 2022-03-01: qty 60, 30d supply, fill #1

## 2022-02-04 MED ORDER — BUSPIRONE HCL 10 MG PO TABS
ORAL_TABLET | ORAL | 1 refills | Status: DC
Start: 2022-02-04 — End: 2022-06-12
  Filled 2022-02-04: qty 90, 30d supply, fill #0
  Filled 2022-03-01: qty 90, 30d supply, fill #1

## 2022-02-05 ENCOUNTER — Other Ambulatory Visit: Payer: Self-pay

## 2022-02-08 ENCOUNTER — Other Ambulatory Visit: Payer: Self-pay

## 2022-02-08 ENCOUNTER — Encounter: Payer: Self-pay | Admitting: Pharmacy Technician

## 2022-02-08 NOTE — Patient Outreach (Signed)
Patient only signed DOH Attestation.  Would need to provide current year's household income if PAP medications were needed.  Margene Cherian J. Mouhamadou Gittleman Patient Advocate Specialist [facility]  

## 2022-02-27 ENCOUNTER — Encounter: Payer: Self-pay | Admitting: Nurse Practitioner

## 2022-02-27 ENCOUNTER — Ambulatory Visit: Payer: Self-pay | Admitting: Nurse Practitioner

## 2022-02-27 DIAGNOSIS — F339 Major depressive disorder, recurrent, unspecified: Secondary | ICD-10-CM

## 2022-02-27 DIAGNOSIS — Z113 Encounter for screening for infections with a predominantly sexual mode of transmission: Secondary | ICD-10-CM

## 2022-02-27 DIAGNOSIS — F419 Anxiety disorder, unspecified: Secondary | ICD-10-CM

## 2022-02-27 LAB — HEPATITIS B SURFACE ANTIGEN: Hepatitis B Surface Ag: NONREACTIVE

## 2022-02-27 LAB — HM HEPATITIS C SCREENING LAB: HM Hepatitis Screen: NEGATIVE

## 2022-02-27 LAB — HM HIV SCREENING LAB: HM HIV Screening: NEGATIVE

## 2022-02-27 NOTE — Progress Notes (Signed)
Patient seen for STD testing. Denies exposures and S/S. Non-latex condoms given.
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
## 2022-03-01 ENCOUNTER — Other Ambulatory Visit: Payer: Self-pay | Admitting: Gerontology

## 2022-03-01 ENCOUNTER — Other Ambulatory Visit: Payer: Self-pay

## 2022-03-01 DIAGNOSIS — B353 Tinea pedis: Secondary | ICD-10-CM

## 2022-03-01 LAB — CHLAMYDIA/GC NAA, CONFIRMATION
Chlamydia trachomatis, NAA: NEGATIVE
Neisseria gonorrhoeae, NAA: NEGATIVE

## 2022-03-03 ENCOUNTER — Other Ambulatory Visit: Payer: Self-pay | Admitting: Gerontology

## 2022-03-03 ENCOUNTER — Other Ambulatory Visit: Payer: Self-pay

## 2022-03-03 DIAGNOSIS — B353 Tinea pedis: Secondary | ICD-10-CM

## 2022-03-03 LAB — GONOCOCCUS CULTURE

## 2022-03-04 ENCOUNTER — Other Ambulatory Visit: Payer: Self-pay

## 2022-03-05 ENCOUNTER — Other Ambulatory Visit: Payer: Self-pay

## 2022-03-05 MED FILL — Terbinafine HCl Cream 1%: CUTANEOUS | Qty: 30 | Fill #0 | Status: CN

## 2022-04-08 ENCOUNTER — Other Ambulatory Visit: Payer: Self-pay

## 2022-05-29 ENCOUNTER — Other Ambulatory Visit: Payer: Self-pay

## 2022-05-30 ENCOUNTER — Other Ambulatory Visit: Payer: Self-pay

## 2022-05-31 ENCOUNTER — Other Ambulatory Visit: Payer: Self-pay

## 2022-06-02 ENCOUNTER — Encounter (HOSPITAL_BASED_OUTPATIENT_CLINIC_OR_DEPARTMENT_OTHER): Payer: Self-pay | Admitting: Pediatrics

## 2022-06-02 ENCOUNTER — Emergency Department (HOSPITAL_BASED_OUTPATIENT_CLINIC_OR_DEPARTMENT_OTHER)
Admission: EM | Admit: 2022-06-02 | Discharge: 2022-06-02 | Disposition: A | Discharge status: Home / Self Care | Payer: Medicaid Other | Attending: Emergency Medicine | Admitting: Emergency Medicine

## 2022-06-02 ENCOUNTER — Other Ambulatory Visit: Payer: Self-pay

## 2022-06-02 DIAGNOSIS — M5416 Radiculopathy, lumbar region: Secondary | ICD-10-CM | POA: Insufficient documentation

## 2022-06-02 MED ORDER — PREDNISONE 20 MG PO TABS: ORAL_TABLET | ORAL | 0 refills | Status: DC

## 2022-06-02 MED ORDER — KETOROLAC TROMETHAMINE 30 MG/ML IJ SOLN
30.0000 mg | Freq: Once | INTRAMUSCULAR | Status: AC
  Administered 2022-06-02: 30 mg via INTRAMUSCULAR
  Filled 2022-06-02: qty 1

## 2022-06-02 MED ORDER — METHOCARBAMOL 500 MG PO TABS: 1000.0000 mg | ORAL_TABLET | Freq: Three times a day (TID) | ORAL | 0 refills | Status: DC | PRN

## 2022-06-02 NOTE — Discharge Instructions (Signed)
Please read and follow all provided instructions.  Your diagnoses today include:  1. Lumbar radiculopathy    Tests performed today include: Vital signs - see below for your results today  Medications prescribed:  Prednisone - steroid medicine   It is best to take this medication in the morning to prevent sleeping problems. If you are diabetic, monitor your blood sugar closely and stop taking Prednisone if blood sugar is over 300. Take with food to prevent stomach upset.   Robaxin (methocarbamol) - muscle relaxer medication  DO NOT drive or perform any activities that require you to be awake and alert because this medicine can make you drowsy.   Please use over-the-counter NSAID medications (ibuprofen, naproxen) or Tylenol (acetaminophen) as directed on the packaging for pain -- as long as you do not have any reasons avoid these medications. Reasons to avoid NSAID medications include: weak kidneys, a history of bleeding in your stomach or gut, or uncontrolled high blood pressure or previous heart attack. Reasons to avoid Tylenol include: liver problems or ongoing alcohol use. Never take more than 4000mg  or 8 Extra strength Tylenol in a 24 hour period.     Take any prescribed medications only as directed.  Home care instructions:  Follow any educational materials contained in this packet Please rest, use ice or heat on your back for the next several days Do not lift, push, pull anything more than 10 pounds for the next week  Follow-up instructions: Please follow-up with your primary care provider in the next 1 week for further evaluation of your symptoms.   Return instructions:  SEEK IMMEDIATE MEDICAL ATTENTION IF YOU HAVE: New numbness, tingling, weakness, or problem with the use of your arms or legs Severe back pain not relieved with medications Loss control of your bowels or bladder Increasing pain in any areas of the body (such as chest or abdominal pain) Shortness of breath,  dizziness, or fainting.  Worsening nausea (feeling sick to your stomach), vomiting, fever, or sweats Any other emergent concerns regarding your health   Additional Information:  Your vital signs today were: BP (!) 129/93 (BP Location: Right Arm)   Pulse 70   Temp 97.9 F (36.6 C) (Oral)   Resp 18   Ht 5\' 6"  (1.676 m)   Wt 9.072 kg   SpO2 100%   BMI 3.23 kg/m  If your blood pressure (BP) was elevated above 135/85 this visit, please have this repeated by your doctor within one month. --------------

## 2022-06-02 NOTE — ED Notes (Signed)
Patient endorsed he is 2 weeks clean from substance abuse and ask to not use any narcotic for pain control.

## 2022-06-02 NOTE — ED Provider Notes (Signed)
[facility] EMERGENCY DEPARTMENT Provider Note   CSN: [id number] Arrival date & time: 06/02/22  0844     History  Chief Complaint  Patient presents with   Leg Pain    Stuart Rivers is a 31 y.o. male.  Patient presents to the emergency department for evaluation of 1 day of right hip pain that radiates down the leg to the knee.  He describes the pain as a burning and throbbing pain.  He denies any associated strenuous activity or preceding trauma or falls.  He has had injection into his right hip in the past, but these symptoms are different.  No weakness in the leg or foot drop.  Patient denies warning symptoms of back pain including: fecal incontinence, urinary retention or overflow incontinence, night sweats, waking from sleep with back pain, unexplained fevers or weight loss, h/o cancer, IVDU, recent trauma.          Home Medications Prior to Admission medications   Medication Sig Start Date End Date Taking? Authorizing Provider  methocarbamol (ROBAXIN) 500 MG tablet Take 2 tablets (1,000 mg total) by mouth every 8 (eight) hours as needed for muscle spasms. 06/02/22  Yes Carlisle Cater, PA-C  predniSONE (DELTASONE) 20 MG tablet 3 Tabs PO Days 1-3, then 2 tabs PO Days 4-6, then 1 tab PO Day 7-9, then Half Tab PO Day 10-12 06/02/22  Yes Carlisle Cater, PA-C  acetaminophen (TYLENOL) 325 MG tablet Take 2 tablets (650 mg total) by mouth 2 (two) times daily. 08/01/20   Iloabachie, Chioma E, NP  blood glucose meter kit and supplies KIT Dispense based on patient and insurance preference. Use up to four times daily as directed. (FOR ICD-9 250.00, 250.01). 08/01/20   Iloabachie, Chioma E, NP  busPIRone (BUSPAR) 10 MG tablet Take one TID PRN anxiety 02/04/22     hydrOXYzine (VISTARIL) 25 MG capsule Take 1 capsule (25 mg total) by mouth 3 (three) times daily as needed for anxiety. 05/07/21     hydrOXYzine (VISTARIL) 25 MG capsule Take 1 capsule (25 mg total) by mouth 3 (three)  times daily as needed for anxiety. 02/04/22     lamoTRIgine (LAMICTAL) 100 MG tablet TAKE ONE TABLET BY MOUTH TWICE DAILY. 05/07/21     lamoTRIgine (LAMICTAL) 100 MG tablet TAKE ONE TABLET BY MOUTH TWICE DAILY. 02/04/22     metFORMIN (GLUCOPHAGE) 1000 MG tablet Take 1 tablet (1,000 mg total) by mouth 2 (two) times daily with a meal. 01/31/22 01/31/23  Iloabachie, Chioma E, NP  terbinafine (LAMISIL) 1 % cream Apply 1 application topically to the affected area(s) 2 (two) times daily. 03/05/22   Iloabachie, Chioma E, NP      Allergies    Other    Review of Systems   Review of Systems  Physical Exam Updated Vital Signs BP (!) 129/93 (BP Location: Right Arm)   Pulse 70   Temp 97.9 F (36.6 C) (Oral)   Resp 18   Ht _0  (1.676 m)   Wt 9.072 kg   SpO2 100%   BMI 3.23 kg/m  Physical Exam Vitals and nursing note reviewed.  Constitutional:      Appearance: He is well-developed.  HENT:     Head: Normocephalic and atraumatic.  Eyes:     Conjunctiva/sclera: Conjunctivae normal.  Abdominal:     Palpations: Abdomen is soft.     Tenderness: There is no abdominal tenderness. There is no right CVA tenderness or left CVA tenderness.  Musculoskeletal:  General: Normal range of motion.     Cervical back: Normal range of motion. No tenderness. Normal range of motion.     Thoracic back: No tenderness. Normal range of motion.     Lumbar back: Tenderness present. No bony tenderness. Normal range of motion.     Comments: No step-off noted with palpation of spine.   Skin:    General: Skin is warm and dry.  Neurological:     Mental Status: He is alert.     Sensory: No sensory deficit.     Motor: No abnormal muscle tone.     Comments: 5/5 strength in entire lower extremities bilaterally. No sensation deficit.  Patient able to stand from a sitting position without any difficulty.  Psychiatric:        Mood and Affect: Mood normal.     ED Results / Procedures / Treatments   Labs (all labs  ordered are listed, but only abnormal results are displayed) Labs Reviewed - No data to display  EKG None  Radiology No results found.  Procedures Procedures    Medications Ordered in ED Medications  ketorolac (TORADOL) 30 MG/ML injection 30 mg (has no administration in time range)    ED Course/ Medical Decision Making/ A&P    Patient seen and examined. History obtained directly from patient.    Labs/EKG: None ordered.  Imaging: None ordered. Considering imaging of the lumbar spine with x-ray or CT but no red flags today and feel this would be low yield.   Medications/Fluids: Ordered: IV toradol   Most recent vital signs reviewed and are as follows: BP (!) 129/93 (BP Location: Right Arm)   Pulse 70   Temp 97.9 F (36.6 C) (Oral)   Resp 18   Ht _0  (1.676 m)   Wt 9.072 kg   SpO2 100%   BMI 3.23 kg/m   Initial impression: low back pain with radicular features  Home treatment plan: Patient was counseled on back pain precautions and advised to do activity as tolerated but avoid strenuous activity and do not lift, push, or pull heavy objects more than 10 pounds for the next week.  Patient counseled to use ice or heat on back as needed for pain and spasm.    Medications prescribed:   Robaxin for muscle pain and spasm. Patient counseled on proper use of muscle relaxant medication.  They were requested not to drink alcohol, drive any vehicle, or do any dangerous activities while taking this medication due to potential drowsiness and unintended.  Patient verbalized understanding.  Prednisone taper  Return instructions discussed with patient: Urged to return with worsening severe pain, loss of bowel or bladder control, trouble walking, development of weakness in the legs, or with any other concerns.  Follow-up instructions discussed with patient: Patient urged to follow-up with PCP if pain does not improve with treatment and rest or if pain becomes recurrent.                            Medical Decision Making Risk Prescription drug management.   Patient with back pain with radicular symptoms. No neurological deficits. Patient is ambulatory. No warning symptoms of back pain including: fecal incontinence, urinary retention or overflow incontinence, night sweats, waking from sleep with back pain, unexplained fevers or weight loss, h/o cancer, IVDU, recent trauma. No concern for cauda equina, epidural abscess, or other serious cause of back pain. Conservative measures such as rest, ice/heat and  pain medicine indicated with PCP follow-up if no improvement with conservative management.          Final Clinical Impression(s) / ED Diagnoses Final diagnoses:  Lumbar radiculopathy    Rx / DC Orders ED Discharge Orders          Ordered    predniSONE (DELTASONE) 20 MG tablet        06/02/22 0921    methocarbamol (ROBAXIN) 500 MG tablet  Every 8 hours PRN        06/02/22 0921              Carlisle Cater, PA-C 06/02/22 0934    Fransico Meadow, MD 06/02/22 1517

## 2022-06-02 NOTE — ED Triage Notes (Signed)
C/O right leg pain; starting up at butt check, radiating down started yesterday; denies any recent fall or trauma.

## 2022-06-04 ENCOUNTER — Other Ambulatory Visit: Payer: Self-pay

## 2022-06-06 ENCOUNTER — Other Ambulatory Visit: Payer: Self-pay

## 2022-06-12 ENCOUNTER — Encounter: Payer: Self-pay | Admitting: Physician Assistant

## 2022-06-12 ENCOUNTER — Ambulatory Visit: Payer: Self-pay | Admitting: Physician Assistant

## 2022-06-12 VITALS — BP 119/82 | HR 91 | Ht 66.0 in | Wt 220.0 lb

## 2022-06-12 DIAGNOSIS — E7800 Pure hypercholesterolemia, unspecified: Secondary | ICD-10-CM

## 2022-06-12 DIAGNOSIS — F3341 Major depressive disorder, recurrent, in partial remission: Secondary | ICD-10-CM

## 2022-06-12 DIAGNOSIS — Z6835 Body mass index (BMI) 35.0-35.9, adult: Secondary | ICD-10-CM

## 2022-06-12 DIAGNOSIS — F1410 Cocaine abuse, uncomplicated: Secondary | ICD-10-CM

## 2022-06-12 DIAGNOSIS — F4310 Post-traumatic stress disorder, unspecified: Secondary | ICD-10-CM

## 2022-06-12 DIAGNOSIS — E119 Type 2 diabetes mellitus without complications: Secondary | ICD-10-CM

## 2022-06-12 DIAGNOSIS — E66812 Obesity, class 2: Secondary | ICD-10-CM

## 2022-06-12 DIAGNOSIS — F1210 Cannabis abuse, uncomplicated: Secondary | ICD-10-CM

## 2022-06-12 DIAGNOSIS — F3170 Bipolar disorder, currently in remission, most recent episode unspecified: Secondary | ICD-10-CM

## 2022-06-12 LAB — POCT GLYCOSYLATED HEMOGLOBIN (HGB A1C): Hemoglobin A1C: 6.7 % — AB (ref 4.0–5.6)

## 2022-06-12 LAB — GLUCOSE, POCT (MANUAL RESULT ENTRY): POC Glucose: 83 mg/dl (ref 70–99)

## 2022-06-12 MED ORDER — ACCU-CHEK SOFTCLIX LANCETS MISC: 12 refills | Status: DC

## 2022-06-12 MED ORDER — ACCU-CHEK AVIVA PLUS W/DEVICE KIT: 1.0000 [IU] | PACK | Freq: Once | 0 refills | Status: AC

## 2022-06-12 MED ORDER — METFORMIN HCL 1000 MG PO TABS: 1000.0000 mg | ORAL_TABLET | Freq: Two times a day (BID) | ORAL | 4 refills | Status: DC

## 2022-06-12 MED ORDER — METFORMIN HCL ER 500 MG PO TB24: 500.0000 mg | ORAL_TABLET | Freq: Two times a day (BID) | ORAL | 1 refills | Status: DC

## 2022-06-12 MED ORDER — LAMOTRIGINE 100 MG PO TABS: 100.0000 mg | ORAL_TABLET | Freq: Two times a day (BID) | ORAL | 1 refills | Status: DC

## 2022-06-12 MED ORDER — ACCU-CHEK GUIDE VI STRP: 1.0000 | ORAL_STRIP | Freq: Two times a day (BID) | 12 refills | Status: DC

## 2022-06-12 NOTE — Patient Instructions (Signed)
Your A1c is 6.7.  You will reduce metformin to 500 mg twice a day.  I encourage you to check your blood glucose levels at least 3 times a week.  I encourage you to keep a written log, and have available for all office visits.  I strongly encourage you to follow a low sugar diet.  I encourage you to work on improving your sleep habits, you should be sleeping 7 to 8 hours a night.  You can use melatonin over-the-counter as needed.  We will call you with today's lab results.  Kennieth Rad, PA-C Physician Assistant [facility] [email or web address]   Insomnia Insomnia is a sleep disorder that makes it difficult to fall asleep or stay asleep. Insomnia can cause fatigue, low energy, difficulty concentrating, mood swings, and poor performance at work or school. There are three different ways to classify insomnia: Difficulty falling asleep. Difficulty staying asleep. Waking up too early in the morning. Any type of insomnia can be long-term (chronic) or short-term (acute). Both are common. Short-term insomnia usually lasts for 3 months or less. Chronic insomnia occurs at least three times a week for longer than 3 months. What are the causes? Insomnia may be caused by another condition, situation, or substance, such as: Having certain mental health conditions, such as anxiety and depression. Using caffeine, alcohol, tobacco, or drugs. Having gastrointestinal conditions, such as gastroesophageal reflux disease (GERD). Having certain medical conditions. These include: Asthma. Alzheimer's disease. Stroke. Chronic pain. An overactive thyroid gland (hyperthyroidism). Other sleep disorders, such as restless legs syndrome and sleep apnea. Menopause. Sometimes, the cause of insomnia may not be known. What increases the risk? Risk factors for insomnia include: Gender. Females are affected more often than males. Age. Insomnia is more common as  people get older. Stress and certain medical and mental health conditions. Lack of exercise. Having an irregular work schedule. This may include working night shifts and traveling between different time zones. What are the signs or symptoms? If you have insomnia, the main symptom is having trouble falling asleep or having trouble staying asleep. This may lead to other symptoms, such as: Feeling tired or having low energy. Feeling nervous about going to sleep. Not feeling rested in the morning. Having trouble concentrating. Feeling irritable, anxious, or depressed. How is this diagnosed? This condition may be diagnosed based on: Your symptoms and medical history. Your health care provider may ask about: Your sleep habits. Any medical conditions you have. Your mental health. A physical exam. How is this treated? Treatment for insomnia depends on the cause. Treatment may focus on treating an underlying condition that is causing the insomnia. Treatment may also include: Medicines to help you sleep. Counseling or therapy. Lifestyle adjustments to help you sleep better. Follow these instructions at home: Eating and drinking  Limit or avoid alcohol, caffeinated beverages, and products that contain nicotine and tobacco, especially close to bedtime. These can disrupt your sleep. Do not eat a large meal or eat spicy foods right before bedtime. This can lead to digestive discomfort that can make it hard for you to sleep. Sleep habits  Keep a sleep diary to help you and your health care provider figure out what could be causing your insomnia. Write down: When you sleep. When you wake up during the night. How well you sleep and how rested you feel the next day. Any side effects of medicines you are taking. What you eat and drink. Make your bedroom a dark, comfortable place where it  is easy to fall asleep. Put up shades or blackout curtains to block light from outside. Use a white noise  machine to block noise. Keep the temperature cool. Limit screen use before bedtime. This includes: Not watching TV. Not using your smartphone, tablet, or computer. Stick to a routine that includes going to bed and waking up at the same times every day and night. This can help you fall asleep faster. Consider making a quiet activity, such as reading, part of your nighttime routine. Try to avoid taking naps during the day so that you sleep better at night. Get out of bed if you are still awake after 15 minutes of trying to sleep. Keep the lights down, but try reading or doing a quiet activity. When you feel sleepy, go back to bed. General instructions Take over-the-counter and prescription medicines only as told by your health care provider. Exercise regularly as told by your health care provider. However, avoid exercising in the hours right before bedtime. Use relaxation techniques to manage stress. Ask your health care provider to suggest some techniques that may work well for you. These may include: Breathing exercises. Routines to release muscle tension. Visualizing peaceful scenes. Make sure that you drive carefully. Do not drive if you feel very sleepy. Keep all follow-up visits. This is important. Contact a health care provider if: You are tired throughout the day. You have trouble in your daily routine due to sleepiness. You continue to have sleep problems, or your sleep problems get worse. Get help right away if: You have thoughts about hurting yourself or someone else. Get help right away if you feel like you may hurt yourself or others, or have thoughts about taking your own life. Go to your nearest emergency room or: Call 911. Call the National Suicide Prevention Lifeline at 6126056568 or 988. This is open 24 hours a day. Text the Crisis Text Line at 269 872 7703. Summary Insomnia is a sleep disorder that makes it difficult to fall asleep or stay asleep. Insomnia can be long-term  (chronic) or short-term (acute). Treatment for insomnia depends on the cause. Treatment may focus on treating an underlying condition that is causing the insomnia. Keep a sleep diary to help you and your health care provider figure out what could be causing your insomnia. This information is not intended to replace advice given to you by your health care provider. Make sure you discuss any questions you have with your health care provider. Document Revised: 07/23/2021 Document Reviewed: 07/23/2021 Elsevier Patient Education  2023 [employer].

## 2022-06-12 NOTE — Progress Notes (Signed)
New Patient Office Visit  Subjective    Patient ID: Stuart Rivers, male    DOB: [birth year]  Age: 31 y.o. MRN: [id number]  CC:  Chief Complaint  Patient presents with   Follow-up    HPI Stuart Rivers states that he is currently being treated for substance abuse at [facility] residential treatment center.  States that he arrived on Oct 5th, 2023 States that he does plan on going home, but moving to [place], [place] after he finishes his treatment  States that he is currently being treated for type 2 diabetes, states that he does not check his blood glucose levels on a daily basis.  States that he was recently incarcerated prior to being treated at [facility] residential treatment center, states that his diet did consist of "a lot of starchy foods" and has been drinking a lot of sweet tea.   States that he feels that he has been irritated a lot recently.  States that he has been working on Research officer, trade union, writing music prior to going to bed.  States that he is staying up later due to these exercises.  States that he is only sleeping approximately 5-1/2 hours.   Outpatient Encounter Medications as of 06/12/2022  Medication Sig   Accu-Chek Softclix Lancets lancets Use as instructed   Blood Glucose Monitoring Suppl (ACCU-CHEK AVIVA PLUS) w/Device KIT 1 Units by Does not apply route once for 1 dose.   glucose blood (ACCU-CHEK GUIDE) test strip 1 each by Other route 2 (two) times daily. Use as instructed   metFORMIN (GLUCOPHAGE-XR) 500 MG 24 hr tablet Take 1 tablet (500 mg total) by mouth 2 (two) times daily with a meal.   acetaminophen (TYLENOL) 325 MG tablet Take 2 tablets (650 mg total) by mouth 2 (two) times daily.   blood glucose meter kit and supplies KIT Dispense based on patient and insurance preference. Use up to four times daily as directed. (FOR ICD-9 250.00, 250.01).   lamoTRIgine (LAMICTAL) 100 MG tablet TAKE ONE TABLET BY MOUTH TWICE DAILY.   methocarbamol (ROBAXIN) 500 MG  tablet Take 2 tablets (1,000 mg total) by mouth every 8 (eight) hours as needed for muscle spasms.   predniSONE (DELTASONE) 20 MG tablet 3 Tabs PO Days 1-3, then 2 tabs PO Days 4-6, then 1 tab PO Day 7-9, then Half Tab PO Day 10-12   [DISCONTINUED] busPIRone (BUSPAR) 10 MG tablet Take one TID PRN anxiety   [DISCONTINUED] hydrOXYzine (VISTARIL) 25 MG capsule Take 1 capsule (25 mg total) by mouth 3 (three) times daily as needed for anxiety.   [DISCONTINUED] hydrOXYzine (VISTARIL) 25 MG capsule Take 1 capsule (25 mg total) by mouth 3 (three) times daily as needed for anxiety.   [DISCONTINUED] lamoTRIgine (LAMICTAL) 100 MG tablet TAKE ONE TABLET BY MOUTH TWICE DAILY.   [DISCONTINUED] lamoTRIgine (LAMICTAL) 100 MG tablet TAKE ONE TABLET BY MOUTH TWICE DAILY.   [DISCONTINUED] metFORMIN (GLUCOPHAGE) 1000 MG tablet Take 1 tablet (1,000 mg total) by mouth 2 (two) times daily with a meal.   [DISCONTINUED] metFORMIN (GLUCOPHAGE) 1000 MG tablet Take 1 tablet (1,000 mg total) by mouth 2 (two) times daily with a meal.   [DISCONTINUED] terbinafine (LAMISIL) 1 % cream Apply 1 application topically to the affected area(s) 2 (two) times daily.   No facility-administered encounter medications on file as of 06/12/2022.    Past Medical History:  Diagnosis Date   Anxiety    Depression    Diabetes mellitus without complication ([place])  No pertinent past medical history     Past Surgical History:  Procedure Laterality Date   CLOSED REDUCTION NASAL FRACTURE  04/07/2012   Procedure: CLOSED REDUCTION NASAL FRACTURE;  Surgeon: Theodoro Kos, DO;  Location: [facility];  Service: Plastics;  Laterality: N/A;   ORIF TIBIA FRACTURE  04/05/2012   Procedure: OPEN REDUCTION INTERNAL FIXATION (ORIF) TIBIA FRACTURE;  Surgeon: Mauri Pole, MD;  Location: [facility];  Service: Orthopedics;  Laterality: Left;    Family History  Problem Relation Age of Onset   Anxiety disorder Mother    Diabetes Mother    Other Father         unknown medical history   Arthritis Other        grandparent   Breast cancer Other    Heart disease Other        grandparent   Hypertension Other        grandparent   Diabetes Other        parent   Diabetes Other        grandparent    Social History   Socioeconomic History   Marital status: Single    Spouse name: Not on file   Number of children: Not on file   Years of education: Not on file   Highest education level: Not on file  Occupational History   Not on file  Tobacco Use   Smoking status: Some Days    Types: Cigars, Cigarettes   Smokeless tobacco: Never   Tobacco comments:    2-3 cigarettes a week  Vaping Use   Vaping Use: Former   Quit date: 09/18/2020   Substances: Nicotine, Flavoring  Substance and Sexual Activity   Alcohol use: Yes    Alcohol/week: 1.0 standard drink of alcohol    Types: 1 Glasses of wine per week    Comment: occassionally   Drug use: Yes    Types: Cocaine, Marijuana    Comment: last use of cocaine 04/15/2020   Sexual activity: Yes    Birth control/protection: Condom  Other Topics Concern   Not on file  Social History Narrative   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      Spiritual Beliefs: Christian      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, international aid - poor diet            Social Determinants of Health   Financial Resource Strain: Not on file  Food Insecurity: No [place] (05/09/2021)   Hunger Vital Sign    Worried About Running Out of Food in the Last Year: Never true    [place] in the Last Year: Never true  Transportation Needs: No Transportation Needs (05/09/2021)   PRAPARE - Hydrologist (Medical): No    Lack of Transportation (Non-Medical): No  Physical Activity: Not on file  Stress: Not on file  Social Connections: Not on file  Intimate Partner Violence: Not At Risk (02/27/2022)   Humiliation, Afraid, Rape, and Kick  questionnaire    Fear of Current or Ex-Partner: No    Emotionally Abused: No    Physically Abused: No    Sexually Abused: No    Review of Systems  Constitutional: Negative.   HENT: Negative.    Eyes: Negative.   Respiratory:  Negative for shortness of breath.   Cardiovascular:  Negative for chest pain.  Gastrointestinal: Negative.   Genitourinary: Negative.  Musculoskeletal: Negative.   Skin: Negative.   Neurological: Negative.   Endo/Heme/Allergies: Negative.   Psychiatric/Behavioral:  Negative for depression. The patient has insomnia.         Objective    BP 119/82 (BP Location: Left Arm, Patient Position: Sitting)   Pulse 91   Ht 5' 6"  (1.676 m)   Wt 220 lb (99.8 kg)   SpO2 99%   BMI 35.51 kg/m   Physical Exam Vitals and nursing note reviewed.  Constitutional:      Appearance: Normal appearance. He is obese.  HENT:     Head: Normocephalic and atraumatic.     Right Ear: External ear normal.     Left Ear: External ear normal.     Mouth/Throat:     Mouth: Mucous membranes are moist.     Pharynx: Oropharynx is clear.  Eyes:     Extraocular Movements: Extraocular movements intact.     Conjunctiva/sclera: Conjunctivae normal.     Pupils: Pupils are equal, round, and reactive to light.  Cardiovascular:     Rate and Rhythm: Normal rate and regular rhythm.     Pulses: Normal pulses.     Heart sounds: Normal heart sounds.  Pulmonary:     Effort: Pulmonary effort is normal.     Breath sounds: Normal breath sounds.  Musculoskeletal:        General: Normal range of motion.     Cervical back: Normal range of motion and neck supple.  Skin:    General: Skin is warm and dry.  Neurological:     General: No focal deficit present.     Mental Status: He is alert and oriented to person, place, and time.  Psychiatric:        Mood and Affect: Mood normal.        Behavior: Behavior normal.        Thought Content: Thought content normal.        Judgment: Judgment  normal.       Assessment & Plan:   Problem List Items Addressed This Visit       Endocrine   Type 2 diabetes mellitus without complications ([place]) - Primary   Relevant Medications   metFORMIN (GLUCOPHAGE-XR) 500 MG 24 hr tablet   Blood Glucose Monitoring Suppl (ACCU-CHEK AVIVA PLUS) w/Device KIT   glucose blood (ACCU-CHEK GUIDE) test strip   Accu-Chek Softclix Lancets lancets   Other Relevant Orders   Comprehensive metabolic panel   CBC with Differential/Platelet   Microalbumin / creatinine urine ratio   POCT glucose (manual entry) (Completed)   POCT glycosylated hemoglobin (Hb A1C) (Completed)     Nervous and Auditory   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 Type 2 diabetes mellitus without complication, without long-term current use of insulin (HCC) A1C 6.7.  Patient does desire to reduce his metformin, trial metformin 500 mg twice daily.  Patient strongly encouraged to follow low sugar diet, check  blood glucose levels at home, keep a written log and have available for all office visits.  Red flags given for prompt reevaluation - Comprehensive metabolic panel - CBC with Differential/Platelet - Microalbumin / creatinine urine ratio - POCT glucose (manual entry) - POCT glycosylated hemoglobin (Hb A1C) - metFORMIN (GLUCOPHAGE-XR) 500 MG 24 hr tablet; Take 1 tablet (500 mg total) by mouth 2 (two) times daily with  a meal.  Dispense: 60 tablet; Refill: 1 - Blood Glucose Monitoring Suppl (ACCU-CHEK AVIVA PLUS) w/Device KIT; 1 Units by Does not apply route once for 1 dose.  Dispense: 1 kit; Refill: 0 - glucose blood (ACCU-CHEK GUIDE) test strip; 1 each by Other route 2 (two) times daily. Use as instructed  Dispense: 100 each; Refill: 12 - Accu-Chek Softclix Lancets lancets; Use as instructed  Dispense: 100 each; Refill: 12  2. Elevated LDL cholesterol level Patient encouraged to follow a low-cholesterol diet  3. Bipolar affective disorder in remission (HCC) Continue current regimen - lamoTRIgine (LAMICTAL) 100 MG tablet; TAKE ONE TABLET BY MOUTH TWICE DAILY.  Dispense: 60 tablet; Refill: 1  4. PTSD (post-traumatic stress disorder)   5. Recurrent major depressive disorder, in partial remission ([place])   6. Cocaine abuse ([place]) Currently in substance abuse treatment program  7. Cannabis abuse   8. Class 2 severe obesity due to excess calories with serious comorbidity and body mass index (BMI) of 35.0 to 35.9 in adult [facility])   I have reviewed the patient's medical history (PMH, PSH, Social History, Family History, Medications, and allergies) , and have been updated if relevant. I spent 30 minutes reviewing chart and  face to face time with patient.     Return if symptoms worsen or fail to improve.   Loraine Grip Mayers, PA-C

## 2022-06-13 ENCOUNTER — Other Ambulatory Visit: Payer: Self-pay

## 2022-06-13 LAB — CBC WITH DIFFERENTIAL/PLATELET
Basophils Absolute: 0.1 10*3/uL (ref 0.0–0.2)
Basos: 0 %
EOS (ABSOLUTE): 0.1 10*3/uL (ref 0.0–0.4)
Eos: 1 %
Hematocrit: 48.8 % (ref 37.5–51.0)
Hemoglobin: 16.1 g/dL (ref 13.0–17.7)
Immature Grans (Abs): 0.1 10*3/uL (ref 0.0–0.1)
Immature Granulocytes: 1 %
Lymphocytes Absolute: 5.9 10*3/uL — ABNORMAL HIGH (ref 0.7–3.1)
Lymphs: 35 %
MCH: 28.6 pg (ref 26.6–33.0)
MCHC: 33 g/dL (ref 31.5–35.7)
MCV: 87 fL (ref 79–97)
Monocytes Absolute: 1.4 10*3/uL — ABNORMAL HIGH (ref 0.1–0.9)
Monocytes: 8 %
Neutrophils Absolute: 9.3 10*3/uL — ABNORMAL HIGH (ref 1.4–7.0)
Neutrophils: 55 %
Platelets: 309 10*3/uL (ref 150–450)
RBC: 5.62 x10E6/uL (ref 4.14–5.80)
RDW: 12.8 % (ref 11.6–15.4)
WBC: 16.8 10*3/uL — ABNORMAL HIGH (ref 3.4–10.8)

## 2022-06-13 LAB — COMPREHENSIVE METABOLIC PANEL
ALT: 14 IU/L (ref 0–44)
AST: 13 IU/L (ref 0–40)
Albumin/Globulin Ratio: 2 (ref 1.2–2.2)
Albumin: 4.7 g/dL (ref 4.3–5.2)
Alkaline Phosphatase: 65 IU/L (ref 44–121)
BUN/Creatinine Ratio: 12 (ref 9–20)
BUN: 13 mg/dL (ref 6–20)
Bilirubin Total: <0.2 mg/dL (ref 0.0–1.2)
CO2: 19 mmol/L — ABNORMAL LOW (ref 20–29)
Calcium: 9.9 mg/dL (ref 8.7–10.2)
Chloride: 101 mmol/L (ref 96–106)
Creatinine, Ser: 1.09 mg/dL (ref 0.76–1.27)
Globulin, Total: 2.3 g/dL (ref 1.5–4.5)
Sodium: 141 mmol/L (ref 134–144)
Total Protein: 7 g/dL (ref 6.0–8.5)
eGFR: 94 mL/min/{1.73_m2} (ref >59)

## 2022-06-14 LAB — MICROALBUMIN / CREATININE URINE RATIO
Creatinine, Urine: 170.3 mg/dL
Microalb/Creat Ratio: 2 mg/g creat (ref 0–29)
Microalbumin, Urine: 3.2 ug/mL

## 2024-06-22 ENCOUNTER — Emergency Department (HOSPITAL_COMMUNITY)
Admission: EM | Admit: 2024-06-22 | Discharge: 2024-06-22 | Disposition: A | Discharge status: Other Acute Inpatient Hospital | Payer: MEDICAID

## 2024-06-22 ENCOUNTER — Other Ambulatory Visit: Payer: Self-pay

## 2024-06-22 ENCOUNTER — Encounter (HOSPITAL_COMMUNITY): Payer: Self-pay | Admitting: Emergency Medicine

## 2024-06-22 ENCOUNTER — Other Ambulatory Visit (HOSPITAL_COMMUNITY): Admission: EM | Admit: 2024-06-22 | Discharge: 2024-06-25 | Payer: MEDICAID | Source: Intra-hospital

## 2024-06-22 DIAGNOSIS — R45851 Suicidal ideations: Secondary | ICD-10-CM | POA: Insufficient documentation

## 2024-06-22 DIAGNOSIS — Z79899 Other long term (current) drug therapy: Secondary | ICD-10-CM | POA: Diagnosis not present

## 2024-06-22 DIAGNOSIS — F259 Schizoaffective disorder, unspecified: Secondary | ICD-10-CM | POA: Diagnosis not present

## 2024-06-22 DIAGNOSIS — Z7984 Long term (current) use of oral hypoglycemic drugs: Secondary | ICD-10-CM | POA: Insufficient documentation

## 2024-06-22 DIAGNOSIS — F1594 Other stimulant use, unspecified with stimulant-induced mood disorder: Secondary | ICD-10-CM | POA: Insufficient documentation

## 2024-06-22 DIAGNOSIS — F332 Major depressive disorder, recurrent severe without psychotic features: Secondary | ICD-10-CM

## 2024-06-22 DIAGNOSIS — F411 Generalized anxiety disorder: Secondary | ICD-10-CM | POA: Diagnosis not present

## 2024-06-22 DIAGNOSIS — F1420 Cocaine dependence, uncomplicated: Secondary | ICD-10-CM | POA: Diagnosis present

## 2024-06-22 DIAGNOSIS — F32A Depression, unspecified: Secondary | ICD-10-CM | POA: Insufficient documentation

## 2024-06-22 DIAGNOSIS — F4310 Post-traumatic stress disorder, unspecified: Secondary | ICD-10-CM | POA: Insufficient documentation

## 2024-06-22 DIAGNOSIS — E119 Type 2 diabetes mellitus without complications: Secondary | ICD-10-CM | POA: Insufficient documentation

## 2024-06-22 DIAGNOSIS — R000 Tachycardia, unspecified: Secondary | ICD-10-CM | POA: Diagnosis not present

## 2024-06-22 LAB — COMPREHENSIVE METABOLIC PANEL WITH GFR
ALT: 11 U/L (ref 0–44)
AST: 23 U/L (ref 15–41)
Albumin: 4.6 g/dL (ref 3.5–5.0)
Alkaline Phosphatase: 61 U/L (ref 38–126)
Anion gap: 14 (ref 5–15)
BUN: 10 mg/dL (ref 6–20)
CO2: 22 mmol/L (ref 22–32)
Calcium: 9.9 mg/dL (ref 8.9–10.3)
Chloride: 104 mmol/L (ref 98–111)
Creatinine, Ser: 1.09 mg/dL (ref 0.61–1.24)
GFR, Estimated: >60 mL/min (ref >60)
Glucose, Bld: 180 mg/dL — ABNORMAL HIGH (ref 70–99)
Potassium: 6.3 mmol/L (ref 3.5–5.1)
Sodium: 141 mmol/L (ref 135–145)
Total Bilirubin: 0.5 mg/dL (ref 0.0–1.2)
Total Protein: 7.3 g/dL (ref 6.5–8.1)

## 2024-06-22 LAB — URINE DRUG SCREEN
Amphetamines: NEGATIVE
Barbiturates: NEGATIVE
Benzodiazepines: NEGATIVE
Cocaine: POSITIVE — AB
Fentanyl: NEGATIVE
Methadone Scn, Ur: NEGATIVE
Opiates: NEGATIVE
Tetrahydrocannabinol: POSITIVE — AB

## 2024-06-22 LAB — POTASSIUM: Potassium: 4.4 mmol/L (ref 3.5–5.1)

## 2024-06-22 LAB — CBC WITH DIFFERENTIAL/PLATELET
Abs Immature Granulocytes: 0.03 K/uL (ref 0.00–0.07)
Basophils Absolute: 0.1 K/uL (ref 0.0–0.1)
Basophils Relative: 1 %
Eosinophils Absolute: 0 K/uL (ref 0.0–0.5)
Eosinophils Relative: 0 %
HCT: 48.4 % (ref 39.0–52.0)
Hemoglobin: 15.2 g/dL (ref 13.0–17.0)
Immature Granulocytes: 0 %
Lymphocytes Relative: 26 %
Lymphs Abs: 2.9 K/uL (ref 0.7–4.0)
MCH: 27.5 pg (ref 26.0–34.0)
MCHC: 31.4 g/dL (ref 30.0–36.0)
MCV: 87.5 fL (ref 80.0–100.0)
Monocytes Absolute: 1 K/uL (ref 0.1–1.0)
Monocytes Relative: 9 %
Neutro Abs: 7.3 K/uL (ref 1.7–7.7)
Neutrophils Relative %: 64 %
Platelets: 294 K/uL (ref 150–400)
RBC: 5.53 MIL/uL (ref 4.22–5.81)
RDW: 13.5 % (ref 11.5–15.5)
WBC: 11.2 K/uL — ABNORMAL HIGH (ref 4.0–10.5)
nRBC: 0 % (ref 0.0–0.2)

## 2024-06-22 LAB — ETHANOL: Alcohol, Ethyl (B): <15 mg/dL (ref <15)

## 2024-06-22 LAB — CBG MONITORING, ED: Glucose-Capillary: 208 mg/dL — ABNORMAL HIGH (ref 70–99)

## 2024-06-22 MED ORDER — HYDROXYZINE HCL 25 MG PO TABS: 25.0000 mg | ORAL_TABLET | Freq: Three times a day (TID) | ORAL | Status: DC | PRN

## 2024-06-22 MED ORDER — ZIPRASIDONE MESYLATE 20 MG IM SOLR: 20.0000 mg | INTRAMUSCULAR | Status: DC | PRN

## 2024-06-22 MED ORDER — NICOTINE 21 MG/24HR TD PT24
21.0000 mg | MEDICATED_PATCH | Freq: Every day | TRANSDERMAL | Status: DC
  Administered 2024-06-22: 21 mg via TRANSDERMAL
  Filled 2024-06-22: qty 1

## 2024-06-22 MED ORDER — ZOLPIDEM TARTRATE 5 MG PO TABS: 5.0000 mg | ORAL_TABLET | Freq: Every evening | ORAL | Status: DC | PRN

## 2024-06-22 MED ORDER — INSULIN ASPART 100 UNIT/ML IJ SOLN
0.0000 [IU] | Freq: Three times a day (TID) | INTRAMUSCULAR | Status: DC
  Filled 2024-06-22: qty 0.15

## 2024-06-22 MED ORDER — ALUM & MAG HYDROXIDE-SIMETH 200-200-20 MG/5ML PO SUSP: 30.0000 mL | Freq: Four times a day (QID) | ORAL | Status: DC | PRN

## 2024-06-22 MED ORDER — ACETAMINOPHEN 325 MG PO TABS: 650.0000 mg | ORAL_TABLET | ORAL | Status: DC | PRN

## 2024-06-22 MED ORDER — INSULIN ASPART 100 UNIT/ML IJ SOLN
0.0000 [IU] | Freq: Every day | INTRAMUSCULAR | Status: DC
  Filled 2024-06-22: qty 0.05

## 2024-06-22 MED ORDER — RISPERIDONE 0.5 MG PO TBDP: 2.0000 mg | ORAL_TABLET | Freq: Three times a day (TID) | ORAL | Status: DC | PRN

## 2024-06-22 MED ORDER — INSULIN ASPART 100 UNIT/ML IJ SOLN
4.0000 [IU] | Freq: Three times a day (TID) | INTRAMUSCULAR | Status: DC
  Filled 2024-06-22: qty 0.04

## 2024-06-22 MED ORDER — ONDANSETRON HCL 4 MG PO TABS: 4.0000 mg | ORAL_TABLET | Freq: Three times a day (TID) | ORAL | Status: DC | PRN

## 2024-06-22 MED ORDER — LORAZEPAM 1 MG PO TABS: 1.0000 mg | ORAL_TABLET | ORAL | Status: DC | PRN

## 2024-06-22 NOTE — ED Notes (Signed)
 [facility] returned phone call and stated that they would be here to pick patient up hopefully by 11pm

## 2024-06-22 NOTE — ED Notes (Signed)
 Patient just used his third phone call.

## 2024-06-22 NOTE — ED Notes (Signed)
 Let patient know that [facility] will be here soon to pick up patient.

## 2024-06-22 NOTE — Progress Notes (Signed)
 Pt has been accepted to [facility] . Bed assignment:[id number]  Report can be called to: - [phone]

## 2024-06-22 NOTE — ED Notes (Signed)
 Called report to Rudell Blacker, RN at [facility]. Called [facility] and they were in a middle of taking another patient to a facility. They asked if they could call back in 20-30 minutes after they completed the current transport. I stated that would be fine and left him my number. Awaiting return phone call.

## 2024-06-22 NOTE — ED Notes (Signed)
 One belongings bag placed in locker # 35

## 2024-06-22 NOTE — ED Triage Notes (Signed)
 Pt reports SI for the last month. PT reports that he started doing cocaine again and is upset at himself for relapsing.

## 2024-06-22 NOTE — ED Notes (Signed)
 Pt dressed out in purple scrubs with all belongings placed in belongings bag. Pt wanded before taken back to [facility]

## 2024-06-22 NOTE — Consult Note (Signed)
 [facility] Psychiatric Consult Initial  Patient Name: .Stuart Rivers  MRN: [id number]  DOB: Apr 29, 1991  Consult Order details:  Orders (From admission, onward)     Start     Ordered   06/22/24 1339  CONSULT TO CALL ACT TEAM       Ordering Provider: Nivia Colon, PA-C  Provider:  (Not yet assigned)  Question:  Reason for Consult?  Answer:  Psych consult   06/22/24 1339             Mode of Visit: In person    Psychiatry Consult Evaluation  Service Date: June 22, 2024 LOS:  LOS: 0 days  Chief Complaint 33 year old male presenting with suicidal ideation in the context of recent relapse to cocaine and worsening depressive symptoms.  Primary Psychiatric Diagnoses  Schizoaffective disorder 2.   PTSD 3.   MDD (major depressive disorder), recurrent severe, without psychosis (HCC)  Assessment  Stuart Rivers is a 33 y.o. male admitted: Presented to the EDfor 06/22/2024 12:14 PM for suicidal ideation in the context of recent relapse to cocaine and worsening depressive symptoms.Stuart Rivers He carries the psychiatric diagnoses of schizoaffective, PTSD and depression and has a past medical history of type 2 diabetes, athlete's foot.   Patient is experiencing significant depression with passive SI in the context of schizoaffective disorder and active cocaine relapse. Lack of medication adherence, limited coping skills, and unsafe environment make him not appropriate for discharge. Please see plan below for detailed recommendations.   Diagnoses:  Active Hospital problems: Principal Problem:   MDD (major depressive disorder), recurrent severe, without psychosis (HCC) Active Problems:   Cocaine use disorder, severe, dependence (HCC)    Plan   ## Psychiatric Medication Recommendations:  Give Atarax  25 mg p.o. 3 times daily as needed for anxiety  ## Medical Decision Making Capacity: Not specifically addressed in this encounter  ## Further Work-up:  -- No further workup  needed at this time EKG or UDS -- most recent EKG on 06/22/2024 had QtC of 468 -- Pertinent labwork reviewed earlier this admission includes: CBC, EKG, UDS   ## Disposition:-- Recommend facility-based crisis [facility]) admission for detox and stabilization. Patient is under voluntary admission status at this time; please IVC if attempts to leave hospital.   ## Behavioral / Environmental: -To minimize splitting of staff, assign one staff person to communicate all information from the team when feasible. or Utilize compassion and acknowledge the patient's experiences while setting clear and realistic expectations for care.    ## Safety and Observation Level:  - Based on my clinical evaluation, I estimate the patient to be at moderate risk of self harm in the current setting. - At this time, we recommend  routine. This decision is based on my review of the chart including patient's history and current presentation, interview of the patient, mental status examination, and consideration of suicide risk including evaluating suicidal ideation, plan, intent, suicidal or self-harm behaviors, risk factors, and protective factors. This judgment is based on our ability to directly address suicide risk, implement suicide prevention strategies, and develop a safety plan while the patient is in the clinical setting. Please contact our team if there is a concern that risk level has changed.  CSSR Risk Category:C-SSRS RISK CATEGORY: High Risk  Suicide Risk Assessment: Patient has following modifiable risk factors for suicide: under treated depression , recklessness, and current symptoms: anxiety/panic, insomnia, impulsivity, anhedonia, hopelessness, which we are addressing by recommending facility-based crisis treatment. Patient has following non-modifiable  or demographic risk factors for suicide: male gender and psychiatric hospitalization Patient has the following protective factors against suicide: Supportive  family and Supportive friends  Thank you for this consult request. Recommendations have been communicated to the primary team.  We will continue to follow patient at this time.   Daquana Paddock MOTLEY-MANGRUM, PMHNP       History of Present Illness  Relevant Aspects of Hospital ED Course:  Admitted on 06/22/2024 for 33 year old male presenting with suicidal ideation in the context of recent relapse to cocaine and worsening depressive symptoms.  Patient Report:  Patient reports several months of increasing suicidal thoughts without a specific plan. States he relapsed today on cocaine, stating, "I have made some mistakes." Reports intense feelings of guilt, embarrassment, and hopelessness related to addiction and its impact on his loved ones. He verbalizes passive SI, stating he does not want to live but denies intent or plan at this time. No HI, AVH, or paranoia endorsed during assessment.  Reports polysubstance use including cocaine (last use today) and marijuana, and alcohol  intake of approx. 1 beer daily, more if affordable, for the last ~3 months. UDS positive for cocaine and marijuana.  Patient is currently unemployed and staying with a friend. Describes strained family relationships due to stealing, lying, and selling belongings to support drug use. Reports long-standing mental health symptoms and believes he needs help.  Psychiatric/Substance Use History: Diagnoses: Schizoaffective disorder, PTSD Prior psychiatric admissions: 2017 and 2018 Prior detox/substance treatment: [facility] (most recently in Feb 2025 for 9 months)  Longest sobriety: 1.5 years from cocaine (2022) Not taking psychiatric medications for ~3 years Family history: Positive for mental illness No history of SA reported beyond current SI  Risk Assessment: Risk factors: SI, recent relapse, polysubstance use, non-compliance with meds, unemployment, limited support, prior  hospitalizations Protective factors: Help-seeking behavior, supportive grandmother Overall suicide risk: Elevated  Psych ROS:  Depression: Endorses Anxiety: Endorses Mania (lifetime and current): Denies Psychosis: (lifetime and current): Denies  Collateral information:  Grandmother contacted; states patient has struggled for years and needs mental health intervention, supports admission.  Review of Systems  Psychiatric/Behavioral:  Positive for depression, substance abuse and suicidal ideas.      Psychiatric and Social History  Psychiatric History:  Information collected from patient and chart review  Prev Dx/Sx: Schizoaffective disorder, depression, anxiety Current Psych Provider: Denies Home Meds (current): Denies Previous Med Trials: Yes Therapy: Denies  Prior Psych Hospitalization: Yes Prior Self Harm: Denies Prior Violence: Denies  Family Psych History: Yes Family Hx suicide: Denies  Social History:  Developmental Hx: Deferred Educational Hx: Graduated high school Occupational Hx: Unemployed Legal Hx: Yes Living Situation: Lives with a friend Spiritual Hx: Yes Access to weapons/lethal means: Denies   Substance History Alcohol : Yes Type of alcohol  varies Last Drink Saturday Number of drinks per day 1-2 beers a day History of alcohol  withdrawal seizures Denies History of DT's Denies Tobacco: Yes Illicit drugs: Yes Prescription drug abuse: Denies Rehab hx: Yes  Exam Findings  Physical Exam:  Vital Signs:  Temp:  [99.2 F (37.3 C)] 99.2 F (37.3 C) (10/28 1217) Pulse Rate:  [107] 107 (10/28 1217) Resp:  [17] 17 (10/28 1217) BP: (173)/(105) 173/105 (10/28 1217) SpO2:  [97 %] 97 % (10/28 1217) Blood pressure (!) 173/105, pulse (!) 107, temperature 99.2 F (37.3 C), temperature source Oral, resp. rate 17, SpO2 97%. There is no height or weight on file to calculate BMI.  Physical Exam Vitals and nursing note  reviewed. Exam conducted with a chaperone  present.  Neurological:     Mental Status: He is alert.  Psychiatric:        Attention and Perception: Attention normal.        Mood and Affect: Mood is depressed. Affect is flat and tearful.        Speech: Speech normal.        Behavior: Behavior is cooperative.        Thought Content: Thought content includes suicidal ideation.        Cognition and Memory: Memory normal.        Judgment: Judgment is inappropriate.     Mental Status Exam: General Appearance: Appropriate for environment; disheveled but cooperative  Orientation:  Full (Time, Place, and Person)  Memory:  Immediate;   Fair  Concentration:  Concentration: Fair  Recall:  Fair  Attention  Fair  Eye Contact:  Fair  Speech:  Clear and Coherent  Language:  Fair  Volume:  Normal  Mood: "Depressed"  Affect:  Flat / congruent  Thought Process:  Linear, goal-directed  Thought Content:  Passive SI, no plan or intent; denies HI/AVH  Suicidal Thoughts:  Yes.  without intent/plan  Homicidal Thoughts:  No  Judgement:  Impaired regarding substance use and safety  Insight:  Shallow  Psychomotor Activity:  Normal  Akathisia:  No  Fund of Knowledge:  Fair      Assets:  Manufacturing Systems Engineer Desire for Improvement Financial Resources/Insurance Social Support  Cognition:  Impaired,  Mild  ADL's:  Impaired  AIMS (if indicated):        Other History   These have been pulled in through the EMR, reviewed, and updated if appropriate.  Family History:  The patient's family history includes Anxiety disorder in his mother; Arthritis in an other family member; Breast cancer in an other family member; Diabetes in his mother and other family members; Heart disease in an other family member; Hypertension in an other family member; Other in his father.  Medical History: Past Medical History:  Diagnosis Date   Anxiety    Depression    Diabetes mellitus without complication (HCC)    No pertinent past medical history     Surgical  History: Past Surgical History:  Procedure Laterality Date   CLOSED REDUCTION NASAL FRACTURE  04/07/2012   Procedure: CLOSED REDUCTION NASAL FRACTURE;  Surgeon: Estefana Reichert, DO;  Location: [facility] OR;  Service: Plastics;  Laterality: N/A;   ORIF TIBIA FRACTURE  04/05/2012   Procedure: OPEN REDUCTION INTERNAL FIXATION (ORIF) TIBIA FRACTURE;  Surgeon: Donnice JONETTA Car, MD;  Location: [facility] OR;  Service: Orthopedics;  Laterality: Left;     Medications:   Current Facility-Administered Medications:    acetaminophen  (TYLENOL ) tablet 650 mg, 650 mg, Oral, Q4H PRN, Tran, Bowie, PA-C   alum & mag hydroxide-simeth (MAALOX/MYLANTA) 200-200-20 MG/5ML suspension 30 mL, 30 mL, Oral, Q6H PRN, Tran, Bowie, PA-C   risperiDONE  (RISPERDAL  M-TABS) disintegrating tablet 2 mg, 2 mg, Oral, Q8H PRN **AND** LORazepam  (ATIVAN ) tablet 1 mg, 1 mg, Oral, PRN **AND** ziprasidone (GEODON) injection 20 mg, 20 mg, Intramuscular, PRN, Tran, Bowie, PA-C   nicotine  (NICODERM CQ  - dosed in mg/24 hours) patch 21 mg, 21 mg, Transdermal, Daily, Tran, Bowie, PA-C, 21 mg at 06/22/24 1420   ondansetron  (ZOFRAN ) tablet 4 mg, 4 mg, Oral, Q8H PRN, Tran, Bowie, PA-C   zolpidem (AMBIEN) tablet 5 mg, 5 mg, Oral, QHS PRN, Tran, Bowie, PA-C  Current Outpatient Medications:    ADVIL   200 MG CAPS, Take 200-800 mg by mouth every 8 (eight) hours as needed (for pain or headaches- if not taking plain Tylenol  or Advil  Dual Action tablets)., Disp: , Rfl:    ADVIL  DUAL ACTION 125-250 MG TABS, Take 1-2 tablets by mouth every 8 (eight) hours as needed (for headaches or pain- if not taking plain Tylenol  or Advil )., Disp: , Rfl:    BENADRYL ALLERGY 25 MG tablet, Take 50 mg by mouth daily., Disp: , Rfl:    busPIRone  (BUSPAR ) 15 MG tablet, Take 15 mg by mouth 3 (three) times daily as needed (for anxiety)., Disp: , Rfl:    Melatonin 10 MG TABS, Take 10 mg by mouth at bedtime as needed (for sleep)., Disp: , Rfl:    Nicotine  (NICODERM CQ  TD), Place 1 patch onto the  skin daily as needed (for smoking cessation while hospitalized)., Disp: , Rfl:    TYLENOL  500 MG tablet, Take 500-1,000 mg by mouth every 8 (eight) hours as needed for mild pain (pain score 1-3) (or headaches- if not taking plain Advil  or Advil  Dual Action tablets)., Disp: , Rfl:    acetaminophen  (TYLENOL ) 325 MG tablet, Take 2 tablets (650 mg total) by mouth 2 (two) times daily. (Patient not taking: Reported on 06/22/2024), Disp: 30 tablet, Rfl: 0   lamoTRIgine  (LAMICTAL ) 100 MG tablet, TAKE ONE TABLET BY MOUTH TWICE DAILY. (Patient not taking: Reported on 06/22/2024), Disp: 60 tablet, Rfl: 1   metFORMIN  (GLUCOPHAGE -XR) 500 MG 24 hr tablet, Take 1 tablet (500 mg total) by mouth 2 (two) times daily with a meal. (Patient not taking: Reported on 06/22/2024), Disp: 60 tablet, Rfl: 1   methocarbamol  (ROBAXIN ) 500 MG tablet, Take 2 tablets (1,000 mg total) by mouth every 8 (eight) hours as needed for muscle spasms. (Patient not taking: Reported on 06/22/2024), Disp: 20 tablet, Rfl: 0   predniSONE  (DELTASONE ) 20 MG tablet, 3 Tabs PO Days 1-3, then 2 tabs PO Days 4-6, then 1 tab PO Day 7-9, then Half Tab PO Day 10-12 (Patient not taking: Reported on 06/22/2024), Disp: 20 tablet, Rfl: 0  Allergies: Allergies  Allergen Reactions   Latex Hives   Metformin  And Related Diarrhea and Other (See Comments)    Irritates the stomach badly, also    Patrcia Schnepp MOTLEY-MANGRUM, PMHNP

## 2024-06-22 NOTE — ED Notes (Signed)
 Safe transport just picked up patient to take to [facility].

## 2024-06-22 NOTE — ED Provider Notes (Signed)
 [place] EMERGENCY DEPARTMENT AT [facility] Provider Note   CSN: [id number] Arrival date & time: 06/22/24  1200     Patient presents with: No chief complaint on file.   Stuart Rivers is a 33 y.o. male.   The history is provided by the patient and medical records. No language interpreter was used.     33 year old male significant history of depression, diabetes, polysubstance use including cocaine use, and recurrent suicidal thoughts presenting with complaints of suicidal ideation.  Patient states for the past several months he has had increasing suicidal thoughts without any specific plan.  States that he recently relapsed on cocaine use and I have made some mistakes.  Last use was today, states that he feels very depressed and felt that he needs help.  States he kept relapsing on cocaine use and he feels that he is a great disappointment to the people that he love.  He does not endorse any homicidal ideation, auditory or visual examination.  Currently he is here voluntarily.  Prior to Admission medications   Medication Sig Start Date End Date Taking? Authorizing Provider  Accu-Chek Softclix Lancets lancets Use as instructed 06/12/22   Mayers, Cari S, PA-C  acetaminophen  (TYLENOL ) 325 MG tablet Take 2 tablets (650 mg total) by mouth 2 (two) times daily. 08/01/20   Iloabachie, Chioma E, NP  blood glucose meter kit and supplies KIT Dispense based on patient and insurance preference. Use up to four times daily as directed. (FOR ICD-9 250.00, 250.01). 08/01/20   Iloabachie, Chioma E, NP  glucose blood (ACCU-CHEK GUIDE) test strip 1 each by Other route 2 (two) times daily. Use as instructed 06/12/22   Mayers, Cari S, PA-C  lamoTRIgine  (LAMICTAL ) 100 MG tablet TAKE ONE TABLET BY MOUTH TWICE DAILY. 06/12/22   Mayers, Cari S, PA-C  metFORMIN  (GLUCOPHAGE -XR) 500 MG 24 hr tablet Take 1 tablet (500 mg total) by mouth 2 (two) times daily with a meal. 06/12/22   Mayers, Cari S,  PA-C  methocarbamol  (ROBAXIN ) 500 MG tablet Take 2 tablets (1,000 mg total) by mouth every 8 (eight) hours as needed for muscle spasms. 06/02/22   Geiple, Joshua, PA-C  predniSONE  (DELTASONE ) 20 MG tablet 3 Tabs PO Days 1-3, then 2 tabs PO Days 4-6, then 1 tab PO Day 7-9, then Half Tab PO Day 10-12 06/02/22   Desiderio Chew, PA-C    Allergies: Other    Review of Systems  All other systems reviewed and are negative.   Updated Vital Signs BP (!) 173/105 (BP Location: Right Arm)   Pulse (!) 107   Temp 99.2 F (37.3 C) (Oral)   Resp 17   SpO2 97%   Physical Exam Constitutional:      General: He is not in acute distress.    Appearance: He is well-developed.     Comments: Patient is tearful  HENT:     Head: Atraumatic.  Eyes:     Conjunctiva/sclera: Conjunctivae normal.  Cardiovascular:     Rate and Rhythm: Tachycardia present.     Pulses: Normal pulses.     Heart sounds: Normal heart sounds.  Pulmonary:     Effort: Pulmonary effort is normal.     Breath sounds: Normal breath sounds.  Abdominal:     Palpations: Abdomen is soft.  Musculoskeletal:     Cervical back: Normal range of motion and neck supple.  Skin:    Findings: No rash.  Neurological:     Mental Status: He is  alert.  Psychiatric:        Mood and Affect: Mood is depressed. Affect is tearful.        Speech: Speech normal.        Behavior: Behavior is cooperative.        Thought Content: Thought content is not paranoid. Thought content includes suicidal ideation. Thought content does not include homicidal ideation.     (all labs ordered are listed, but only abnormal results are displayed) Labs Reviewed  COMPREHENSIVE METABOLIC PANEL WITH GFR - Abnormal; Notable for the following components:      Result Value   Potassium 6.3 (*)    Glucose, Bld 180 (*)    All other components within normal limits  CBC WITH DIFFERENTIAL/PLATELET - Abnormal; Notable for the following components:   WBC 11.2 (*)    All other  components within normal limits  ETHANOL  POTASSIUM  URINE DRUG SCREEN    EKG: None  Date: 06/22/2024  Rate: 108  Rhythm: sinus tachycardia  QRS Axis: normal  Intervals: normal  ST/T Wave abnormalities: normal  Conduction Disutrbances: none  Narrative Interpretation:   Old EKG Reviewed: No significant changes noted    Radiology: No results found.   Procedures   Medications Ordered in the ED  risperiDONE  (RISPERDAL  M-TABS) disintegrating tablet 2 mg (has no administration in time range)    And  LORazepam  (ATIVAN ) tablet 1 mg (has no administration in time range)    And  ziprasidone (GEODON) injection 20 mg (has no administration in time range)  acetaminophen  (TYLENOL ) tablet 650 mg (has no administration in time range)  zolpidem (AMBIEN) tablet 5 mg (has no administration in time range)  ondansetron  (ZOFRAN ) tablet 4 mg (has no administration in time range)  alum & mag hydroxide-simeth (MAALOX/MYLANTA) 200-200-20 MG/5ML suspension 30 mL (has no administration in time range)  nicotine  (NICODERM CQ  - dosed in mg/24 hours) patch 21 mg (21 mg Transdermal Patch Applied 06/22/24 1420)                                    Medical Decision Making Amount and/or Complexity of Data Reviewed Labs: ordered.  Risk OTC drugs. Prescription drug management.   BP (!) 173/105 (BP Location: Right Arm)   Pulse (!) 107   Temp 99.2 F (37.3 C) (Oral)   Resp 17   SpO2 97%   18:86 PM  33 year old male significant history of depression, diabetes, polysubstance use including cocaine use, and recurrent suicidal thoughts presenting with complaints of suicidal ideation.  Patient states for the past several months he has had increasing suicidal thoughts without any specific plan.  States that he recently relapsed on cocaine use and I have made some mistakes.  Last use was today, states that he feels very depressed and felt that he needs help.  States he kept relapsing on cocaine use and he  feels that he is a great disappointment to the people that he love.  He does not endorse any homicidal ideation, auditory or visual examination.  Currently he is here voluntarily.  On exam patient is tearful but calm and cooperative and answering question appropriately.  Vital signs notable for elevated blood pressure of 173/105, tachycardic with a heart rate of 107.  At this time he is here voluntarily, will perform medical screening exam, will consult TTS and psychiatry for further psychiatric support.  -Labs ordered, independently viewed and interpreted by me.  Labs remarkable for initial potassium of 6.3 however EKG did not show any peaked T waves.  Will request for prompt repeat of his potassium prior to intervention to ensure that we are treating hyperkalemia appropriately. -The patient was maintained on a cardiac monitor.  I personally viewed and interpreted the cardiac monitored which showed an underlying rhythm of: Sinus tachycardia -Imaging not considered -This patient presents to the ED for concern of suicidal ideation, this involves an extensive number of treatment options, and is a complaint that carries with it a high risk of complications and morbidity.  The differential diagnosis includes anxiety, depression, substance induced mood disorder -Co morbidities that complicate the patient evaluation includes depression, DM -Treatment includes supportive care -Reevaluation of the patient after these medicines showed that the patient improved -PCP office notes or outside notes reviewed -Discussion with specialist including TTS and Psych -Escalation to admission/observation considered: patient is medically cleared as repeat K+ is within normal limit.  Psych will assess.  Pt is here voluntarily      Final diagnoses:  Suicidal ideation    ED Discharge Orders     None          Nivia Colon, PA-C 06/22/24 1604    Kammerer, Megan L, DO 06/23/24 [phone]

## 2024-06-22 NOTE — ED Notes (Signed)
 Tried calling report to [facility] and no one picked up. Will continue to try and call report.

## 2024-06-23 ENCOUNTER — Encounter (HOSPITAL_COMMUNITY): Payer: Self-pay | Admitting: Psychiatry

## 2024-06-23 DIAGNOSIS — F411 Generalized anxiety disorder: Secondary | ICD-10-CM | POA: Diagnosis not present

## 2024-06-23 DIAGNOSIS — E119 Type 2 diabetes mellitus without complications: Secondary | ICD-10-CM | POA: Diagnosis not present

## 2024-06-23 DIAGNOSIS — F332 Major depressive disorder, recurrent severe without psychotic features: Secondary | ICD-10-CM | POA: Diagnosis not present

## 2024-06-23 DIAGNOSIS — R45851 Suicidal ideations: Secondary | ICD-10-CM | POA: Diagnosis not present

## 2024-06-23 DIAGNOSIS — F1594 Other stimulant use, unspecified with stimulant-induced mood disorder: Secondary | ICD-10-CM | POA: Diagnosis not present

## 2024-06-23 LAB — GLUCOSE, CAPILLARY
Glucose-Capillary: 175 mg/dL — ABNORMAL HIGH (ref 70–99)
Glucose-Capillary: 248 mg/dL — ABNORMAL HIGH (ref 70–99)
Glucose-Capillary: 192 mg/dL — ABNORMAL HIGH (ref 70–99)

## 2024-06-23 LAB — HEMOGLOBIN A1C
Hgb A1c MFr Bld: 7.9 % — ABNORMAL HIGH (ref 4.8–5.6)
Mean Plasma Glucose: 180.03 mg/dL

## 2024-06-23 MED ORDER — ARIPIPRAZOLE 5 MG PO TABS
5.0000 mg | ORAL_TABLET | Freq: Every day | ORAL | Status: DC
  Administered 2024-06-24 – 2024-06-25 (×2): 5 mg via ORAL
  Filled 2024-06-23: qty 7
  Filled 2024-06-23 (×2): qty 1

## 2024-06-23 MED ORDER — DIPHENHYDRAMINE HCL 50 MG/ML IJ SOLN: 50.0000 mg | Freq: Three times a day (TID) | INTRAMUSCULAR | Status: DC | PRN

## 2024-06-23 MED ORDER — NICOTINE 21 MG/24HR TD PT24
21.0000 mg | MEDICATED_PATCH | Freq: Every day | TRANSDERMAL | Status: DC
  Administered 2024-06-23 – 2024-06-25 (×3): 21 mg via TRANSDERMAL
  Filled 2024-06-23 (×3): qty 1

## 2024-06-23 MED ORDER — MAGNESIUM HYDROXIDE 400 MG/5ML PO SUSP: 30.0000 mL | Freq: Every day | ORAL | Status: DC | PRN

## 2024-06-23 MED ORDER — HALOPERIDOL LACTATE 5 MG/ML IJ SOLN: 10.0000 mg | Freq: Three times a day (TID) | INTRAMUSCULAR | Status: DC | PRN

## 2024-06-23 MED ORDER — LORAZEPAM 2 MG/ML IJ SOLN: 2.0000 mg | Freq: Three times a day (TID) | INTRAMUSCULAR | Status: DC | PRN

## 2024-06-23 MED ORDER — INSULIN ASPART 100 UNIT/ML IJ SOLN: 0.0000 [IU] | Freq: Every day | INTRAMUSCULAR | Status: DC

## 2024-06-23 MED ORDER — INSULIN ASPART 100 UNIT/ML IJ SOLN: 4.0000 [IU] | Freq: Three times a day (TID) | INTRAMUSCULAR | Status: DC

## 2024-06-23 MED ORDER — GLIPIZIDE ER 5 MG PO TB24
5.0000 mg | ORAL_TABLET | Freq: Every day | ORAL | Status: DC
Start: 2024-06-24 — End: 2024-06-25
  Administered 2024-06-24 – 2024-06-25 (×2): 5 mg via ORAL
  Filled 2024-06-23: qty 1
  Filled 2024-06-23: qty 7
  Filled 2024-06-23: qty 1

## 2024-06-23 MED ORDER — ACETAMINOPHEN 325 MG PO TABS
650.0000 mg | ORAL_TABLET | Freq: Four times a day (QID) | ORAL | Status: DC | PRN
  Administered 2024-06-23: 650 mg via ORAL
  Filled 2024-06-23: qty 2

## 2024-06-23 MED ORDER — BUSPIRONE HCL 15 MG PO TABS
15.0000 mg | ORAL_TABLET | Freq: Three times a day (TID) | ORAL | Status: DC | PRN
Start: 2024-06-23 — End: 2024-06-23
  Administered 2024-06-23: 15 mg via ORAL
  Filled 2024-06-23: qty 1

## 2024-06-23 MED ORDER — HALOPERIDOL 5 MG PO TABS: 5.0000 mg | ORAL_TABLET | Freq: Three times a day (TID) | ORAL | Status: DC | PRN

## 2024-06-23 MED ORDER — GABAPENTIN 100 MG PO CAPS
200.0000 mg | ORAL_CAPSULE | Freq: Three times a day (TID) | ORAL | Status: DC
  Administered 2024-06-23 – 2024-06-25 (×7): 200 mg via ORAL
  Filled 2024-06-23 (×5): qty 2
  Filled 2024-06-23: qty 42
  Filled 2024-06-23 (×2): qty 2

## 2024-06-23 MED ORDER — HALOPERIDOL LACTATE 5 MG/ML IJ SOLN: 5.0000 mg | Freq: Three times a day (TID) | INTRAMUSCULAR | Status: DC | PRN

## 2024-06-23 MED ORDER — INSULIN ASPART 100 UNIT/ML IJ SOLN: 0.0000 [IU] | Freq: Three times a day (TID) | INTRAMUSCULAR | Status: DC

## 2024-06-23 MED ORDER — LAMOTRIGINE 100 MG PO TABS
100.0000 mg | ORAL_TABLET | Freq: Two times a day (BID) | ORAL | Status: DC
  Administered 2024-06-23 (×2): 100 mg via ORAL
  Filled 2024-06-23 (×2): qty 1

## 2024-06-23 MED ORDER — ZOLPIDEM TARTRATE 5 MG PO TABS: 5.0000 mg | ORAL_TABLET | Freq: Every evening | ORAL | Status: DC | PRN

## 2024-06-23 MED ORDER — ALUM & MAG HYDROXIDE-SIMETH 200-200-20 MG/5ML PO SUSP: 30.0000 mL | ORAL | Status: DC | PRN

## 2024-06-23 MED ORDER — DIPHENHYDRAMINE HCL 50 MG PO CAPS
50.0000 mg | ORAL_CAPSULE | Freq: Three times a day (TID) | ORAL | Status: DC | PRN
  Administered 2024-06-23 – 2024-06-24 (×3): 50 mg via ORAL
  Filled 2024-06-23 (×4): qty 1

## 2024-06-23 MED ORDER — HYDROXYZINE HCL 25 MG PO TABS
50.0000 mg | ORAL_TABLET | Freq: Three times a day (TID) | ORAL | Status: DC | PRN
  Administered 2024-06-23 – 2024-06-25 (×5): 50 mg via ORAL
  Filled 2024-06-23 (×5): qty 2

## 2024-06-23 NOTE — Care Management (Addendum)
 [facility] Care Management...  Writer met with the patient and discussed discharge planning.  Patient stated not sure what to do next. Patient stated want to speak with partner before deciding inpatient out patient.   Stuart Rivers 33 y/o male. Lives in [address] Patient reported unemployed and wanting to start school  Patient reported substance use, cocaine, weed and alcohol , last used yesterday. Patient reported MH concerns, depression, anxiety, PTSD and Schizoaffective Disorder. Patient reported being off his medications and wanting medication management.   Patient  has Medicaid [employer] Plan   11:00 am Writer faxed referrals to [facility] and [facility] [place]    12:50 pm Writer received call from [facility] for patient to complete phone intake interview  Patient completed phone intake... Patient has been accepted for residential program at [facility] [place].   He tentatively accepted placement, said he wants to call his partner after she gets off work to discuss  2:15 pm Per Jordan @ [facility] Patient has been accepted into their program.  Next available appointments are Wed 06/30/24 and Thurs 07/01/24.   Writer informed patient that he is scheduled for discharge Saturday 06/25/24 and that he needs to let writer know something tomorrow 06/24/2024

## 2024-06-23 NOTE — ED Notes (Addendum)
 Pt, a direct admit from [facility], history of depression, diabetes, polysubstance use including cocaine use, THC and recurrent suicidal thoughts presents [facility].   Patient states for the past several months he has had increasing suicidal thoughts without any specific plan.  States that he recently relapsed on cocaine use. Pt reports use of Cocaine, THC and Alcohol  use. According to pt, he has been using THC since he was 72yrs and Alcohol  since he was 21yrs and Cocaine when he was 62yrs.  Pt reports last use was yesterday. States that he feels very depressed and felt that he needs help.  Pt reports, he hasn't been taking his medication for years. States he kept relapsing on Cocaine use and feel bad about it. Admission process is completed. Medication administered. Meal provided. Q15 safety checks in place per facility policy.

## 2024-06-23 NOTE — ED Provider Notes (Signed)
 Facility Based Crisis Admission H&P  Date: 06/23/24 Patient Name: Stuart Rivers MRN: [id number] Chief Complaint: request for detox from cocaine & ETOH  Diagnoses:  Final diagnoses:  Other stimulant use, unspecified with stimulant-induced mood disorder (HCC)  GAD (generalized anxiety disorder)   HPI: Stuart Rivers is a 34 y.o. male who was admitted to the [facility] at the [facility] for substance abuse treatment & assistance with getting into a longer term substance abuse rehabilitation treatment program. Per ER documentation, pt initially presented to the [facility] on 10/28, and as per that initially encounter:  33 year old male significant history of depression, diabetes, polysubstance use including cocaine use, and recurrent suicidal thoughts presenting with complaints of suicidal ideation. Patient states for the past several months he has had increasing suicidal thoughts without any specific plan. States that he recently relapsed on cocaine use and I have made some mistakes. Last use was today, states that he feels very depressed and felt that he needs help. States he kept relapsing on cocaine use and he feels that he is a great disappointment to the people that he love. He does not endorse any homicidal ideation, auditory or visual examination. Currently he is here voluntarily. Stuart Colon, PA-C,Date of Service: 06/22/2024 12:21 PM ).   Assessment on the [facility], 06/23/2024: Patient is lying in bed at start of encounter, mood is irritable, depressed, initially not receptive to provided information, asking why he has to provide the same information multiple times, prior to willingly providing information with positive reinforcements.  He reports that he resides with a family member.  Reports cocaine addiction twice a week since 2023, states that he is unable to quantify the amount that he uses.  Reports alcohol  consumption, states I drink wine, beer, liquor,  I drink all of it, states that he has been drinking every day for the past 5 years, also does not quantify the amount of alcohol  that he drinks on a daily basis.  He however states that he has never had a seizure, and has never experienced any blackouts related to alcohol  use.  Patient denies any complicated withdrawals from alcohol , denies that he has ever landed in the hospital related to alcohol  withdrawals.  He denies any other substance abuse.  Shares that he last drink was yesterday morning prior to the presentation of the hospital at the [facility], ED.  Patient is asked if he is interested in rehabilitation, and states I will think about it.  Patient is educated about the fact that he will have to make up his mind and pharmacist, hospital or CSW know, as a determination is typically made to look for rehabilitation on presentation to the unit, as well as a discharge date is typically determined on presentation to the unit.  He verbalized understanding.  He shares that he has never been to substance abuse treatment.  Patient reports a history of MDD and GAD, he also reports a history of bipolar disorder, and schizoaffective affective disorder.  Shares that past medications are BuSpar  and Lamictal , states that he stopped medications 1 to 2 years ago, unsure why he stopped them.  Patient denies any suicidal ideations at this time, he reported suicidal ideation in Ed, shares that he had AH prior to the presentation to the ED, commanding in nature, to hang himself, and others.  Reports that he also had paranoia, and felt as though people were after him.  Denies feeling that way currently.  He denies any  history of a suicide attempt, reports 1 mental health related hospitalization, but states that it was a few years ago.  Shares that current stressors are his substance abuse which she seems not to be able to get rid of, financial stressors, and life in general.  Patient reports medical history as diabetes, states  that he takes glipizide , it is not prescribed, but given to him by his aunt who also has diabetes, and she gives him some of her medications.  Patient has been educated on the need to establish primary care services after discharge from this location for management of his diabetes.  Verbalized understanding.  Sliding scale for insulin  NovoLog , placed, but patient is not receptive to the administration of this medication.  He denies any allergies to food or medications.  Patient reports that he is currently not working, has no children, resides with a family member, highest level of education is 12th grade.  Treatment Plan is as noted below:  PHQ 2-9:   Flowsheet Row ED from 06/22/2024 in [facility] Most recent reading at 06/23/2024  1:11 AM ED from 06/22/2024 in [facility] Emergency Department at [facility] Most recent reading at 06/22/2024 12:41 PM ED from 06/02/2022 in [facility] Emergency Department at [facility] Most recent reading at 06/02/2022  8:59 AM  C-SSRS RISK CATEGORY High Risk High Risk No Risk    Total Time spent with patient: 1.5 hours  Musculoskeletal  Strength & Muscle Tone: within normal limits Gait & Station: normal Patient leans: N/A  Psychiatric Specialty Exam  Presentation General Appearance: Casual  Eye Contact:Fair  Speech:Clear and Coherent  Speech Volume:Normal  Handedness:Right   Mood and Affect  Mood:Depressed; Anxious  Affect:Congruent   Thought Process  Thought Processes:Coherent  Descriptions of Associations:Intact  Orientation:Full (Time, Place and Person)  Thought Content:Logical    Hallucinations:Hallucinations: None  Ideas of Reference:None  Suicidal Thoughts:Suicidal Thoughts: No  Homicidal Thoughts:Homicidal Thoughts: No   Sensorium  Memory:Immediate Fair  Judgment:Fair  Insight:Fair   Executive Functions  Concentration:Fair  Attention  Span:Fair  Recall:Fair  Fund of Knowledge:Fair  Language:Fair   Psychomotor Activity  Psychomotor Activity:Psychomotor Activity: Normal   Assets  Assets:Resilience   Sleep  Sleep:Sleep: Fair  Nutritional Assessment (For OBS and FBC admissions only) Does the patient have dental problems?: No Does the patient have eating habits or behaviors that may be indicators of an eating disorder including binging or inducing vomiting?: No Has the patient recently lost weight without trying?: 0   Physical Exam Constitutional:      Appearance: Normal appearance.  Musculoskeletal:     Cervical back: Normal range of motion.  Neurological:     General: No focal deficit present.     Mental Status: He is alert and oriented to person, place, and time.  Psychiatric:        Behavior: Behavior normal.        Thought Content: Thought content normal.    Review of Systems  Psychiatric/Behavioral:  Positive for depression, hallucinations and substance abuse. Negative for memory loss and suicidal ideas. The patient is nervous/anxious and has insomnia.   All other systems reviewed and are negative.   Blood pressure (!) 117/94, pulse 86, temperature (!) 97.4 F (36.3 C), resp. rate 18, SpO2 100%. There is no height or weight on file to calculate BMI.  Is the patient at risk to self? No  Has the patient been a risk to self in the past 6 months? No .  Has the patient been a risk to self within the distant past? No   Is the patient a risk to others? No   Has the patient been a risk to others in the past 6 months? No   Has the patient been a risk to others within the distant past? No   Past Medical History: DM Family History: denies  Social History: not working, resides with a family member, unemployed, no children. 12th grade education.  Last Labs:  Admission on 06/22/2024  Component Date Value Ref Range Status   Glucose-Capillary 06/23/2024 175 (H)  70 - 99 mg/dL Final   Glucose  reference range applies only to samples taken after fasting for at least 8 hours.   Glucose-Capillary 06/23/2024 248 (H)  70 - 99 mg/dL Final   Glucose reference range applies only to samples taken after fasting for at least 8 hours.   Glucose-Capillary 06/23/2024 192 (H)  70 - 99 mg/dL Final   Glucose reference range applies only to samples taken after fasting for at least 8 hours.  Admission on 06/22/2024, Discharged on 06/22/2024  Component Date Value Ref Range Status   Sodium 06/22/2024 141  135 - 145 mmol/L Final   Potassium 06/22/2024 6.3 (HH)  3.5 - 5.1 mmol/L Final   Critical Value, Read Back and verified with SAVOIE,B RN ON 06/22/24 AT 1344 BY GOLSONM   Chloride 06/22/2024 104  98 - 111 mmol/L Final   CO2 06/22/2024 22  22 - 32 mmol/L Final   Glucose, Bld 06/22/2024 180 (H)  70 - 99 mg/dL Final   Glucose reference range applies only to samples taken after fasting for at least 8 hours.   BUN 06/22/2024 10  6 - 20 mg/dL Final   Creatinine, Ser 06/22/2024 1.09  0.61 - 1.24 mg/dL Final   Calcium 89/71/7974 9.9  8.9 - 10.3 mg/dL Final   Total Protein 89/71/7974 7.3  6.5 - 8.1 g/dL Final   Albumin 89/71/7974 4.6  3.5 - 5.0 g/dL Final   AST 89/71/7974 23  15 - 41 U/L Final   ALT 06/22/2024 11  0 - 44 U/L Final   Alkaline Phosphatase 06/22/2024 61  38 - 126 U/L Final   Total Bilirubin 06/22/2024 0.5  0.0 - 1.2 mg/dL Final   GFR, Estimated 06/22/2024 >60  >60 mL/min Final   Comment: (NOTE) Calculated using the CKD-EPI Creatinine Equation (2021)    Anion gap 06/22/2024 14  5 - 15 Final   Performed at [facility], [address] [place], [place] [address]   Alcohol , Ethyl (B) 06/22/2024 <15  <15 mg/dL Final   Comment: (NOTE) For medical purposes only. Performed at [facility], [address], [place], [place] [address]    Opiates 06/22/2024 NEGATIVE  NEGATIVE Final   Cocaine 06/22/2024 POSITIVE (A)  NEGATIVE Final   Benzodiazepines  06/22/2024 NEGATIVE  NEGATIVE Final   Amphetamines 06/22/2024 NEGATIVE  NEGATIVE Final   Tetrahydrocannabinol 06/22/2024 POSITIVE (A)  NEGATIVE Final   Barbiturates 06/22/2024 NEGATIVE  NEGATIVE Final   Methadone Scn, Ur 06/22/2024 NEGATIVE  NEGATIVE Final   Fentanyl  06/22/2024 NEGATIVE  NEGATIVE Final   Comment: (NOTE) Drug screen is for Medical Purposes only. Positive results are preliminary only. If confirmation is needed, notify lab within 5 days.  Drug Class                 Cutoff (ng/mL) Amphetamine and metabolites 1000 Barbiturate and metabolites 200 Benzodiazepine  200 Opiates and metabolites     300 Cocaine and metabolites     300 THC                         50 Fentanyl                     5 Methadone                   300  Trazodone  is metabolized in vivo to several metabolites,  including pharmacologically active m-CPP, which is excreted in the  urine.  Immunoassay screens for amphetamines and MDMA have potential  cross-reactivity with these compounds and may provide false positive  result.  Performed at [facility], [address], [place], [place] [address]    WBC 06/22/2024 11.2 (H)  4.0 - 10.5 K/uL Final   RBC 06/22/2024 5.53  4.22 - 5.81 MIL/uL Final   Hemoglobin 06/22/2024 15.2  13.0 - 17.0 g/dL Final   HCT 89/71/7974 48.4  39.0 - 52.0 % Final   MCV 06/22/2024 87.5  80.0 - 100.0 fL Final   MCH 06/22/2024 27.5  26.0 - 34.0 pg Final   MCHC 06/22/2024 31.4  30.0 - 36.0 g/dL Final   RDW 89/71/7974 13.5  11.5 - 15.5 % Final   Platelets 06/22/2024 294  150 - 400 K/uL Final   nRBC 06/22/2024 0.0  0.0 - 0.2 % Final   Neutrophils Relative % 06/22/2024 64  % Final   Neutro Abs 06/22/2024 7.3  1.7 - 7.7 K/uL Final   Lymphocytes Relative 06/22/2024 26  % Final   Lymphs Abs 06/22/2024 2.9  0.7 - 4.0 K/uL Final   Monocytes Relative 06/22/2024 9  % Final   Monocytes Absolute 06/22/2024 1.0  0.1 - 1.0 K/uL Final   Eosinophils Relative  06/22/2024 0  % Final   Eosinophils Absolute 06/22/2024 0.0  0.0 - 0.5 K/uL Final   Basophils Relative 06/22/2024 1  % Final   Basophils Absolute 06/22/2024 0.1  0.0 - 0.1 K/uL Final   Immature Granulocytes 06/22/2024 0  % Final   Abs Immature Granulocytes 06/22/2024 0.03  0.00 - 0.07 K/uL Final   Performed at [facility], [address], [place], [place] [address]   Potassium 06/22/2024 4.4  3.5 - 5.1 mmol/L Final   Comment: Delta check noted  Repeated to verify  Performed at [facility], [address] [place], [place] [address]    Hgb A1c MFr Bld 06/22/2024 7.9 (H)  4.8 - 5.6 % Final   Comment: (NOTE) Diagnosis of Diabetes The following HbA1c ranges recommended by the American Diabetes Association (ADA) may be used as an aid in the diagnosis of diabetes mellitus.  Hemoglobin             Suggested A1C NGSP%              Diagnosis  <5.7                   Non Diabetic  5.7-6.4                Pre-Diabetic  >6.4                   Diabetic  <7.0                   Glycemic control for  adults with diabetes.     Mean Plasma Glucose 06/22/2024 180.03  mg/dL Final   Performed at [facility], [address], [place], [place] [address]   Glucose-Capillary 06/22/2024 208 (H)  70 - 99 mg/dL Final   Glucose reference range applies only to samples taken after fasting for at least 8 hours.    Allergies: Latex and Metformin  and related  Medications:  Facility Ordered Medications  Medication   nicotine  (NICODERM CQ  - dosed in mg/24 hours) patch 21 mg   acetaminophen  (TYLENOL ) tablet 650 mg   alum & mag hydroxide-simeth (MAALOX/MYLANTA) 200-200-20 MG/5ML suspension 30 mL   magnesium  hydroxide (MILK OF MAGNESIA) suspension 30 mL   haloperidol (HALDOL) tablet 5 mg   And   diphenhydrAMINE (BENADRYL) capsule 50 mg   haloperidol lactate (HALDOL) injection 5 mg   And   diphenhydrAMINE (BENADRYL) injection 50 mg   And    LORazepam  (ATIVAN ) injection 2 mg   haloperidol lactate (HALDOL) injection 10 mg   And   diphenhydrAMINE (BENADRYL) injection 50 mg   And   LORazepam  (ATIVAN ) injection 2 mg   [START ON 06/24/2024] glipiZIDE  (GLUCOTROL  XL) 24 hr tablet 5 mg   [START ON 06/24/2024] ARIPiprazole  (ABILIFY ) tablet 5 mg   hydrOXYzine  (ATARAX ) tablet 50 mg   gabapentin  (NEURONTIN ) capsule 200 mg   PTA Medications  Medication Sig   acetaminophen  (TYLENOL ) 325 MG tablet Take 2 tablets (650 mg total) by mouth 2 (two) times daily. (Patient not taking: Reported on 06/22/2024)   predniSONE  (DELTASONE ) 20 MG tablet 3 Tabs PO Days 1-3, then 2 tabs PO Days 4-6, then 1 tab PO Day 7-9, then Half Tab PO Day 10-12 (Patient not taking: Reported on 06/22/2024)   methocarbamol  (ROBAXIN ) 500 MG tablet Take 2 tablets (1,000 mg total) by mouth every 8 (eight) hours as needed for muscle spasms. (Patient not taking: Reported on 06/22/2024)   lamoTRIgine  (LAMICTAL ) 100 MG tablet TAKE ONE TABLET BY MOUTH TWICE DAILY. (Patient not taking: Reported on 06/22/2024)   metFORMIN  (GLUCOPHAGE -XR) 500 MG 24 hr tablet Take 1 tablet (500 mg total) by mouth 2 (two) times daily with a meal. (Patient not taking: Reported on 06/22/2024)   Long Term Goals: Improvement in symptoms so as ready for discharge  Short Term Goals: Patient will verbalize feelings in meetings with treatment team members., Patient will attend at least of 50% of the groups daily., Pt will complete the PHQ9 on admission, day 3 and discharge., Patient will participate in completing the [place] Suicide Severity Rating Scale, Patient will score a low risk of violence for 24 hours prior to discharge, and Patient will take medications as prescribed daily.  Medical Decision Making  -Continue [facility] to the [facility] -Start Abilify  5 mg daily for mood stabilization -Start Gabapentin  200 mg TID for anxiety/ETOH use d/o -Start glipizide  5 mg daily with breakfast for DM type  II -Discontinue Lamictal  due to poor compliance-not a good mood stabilizer in the context of poor compliance due to risk of SJS patient shares that he has not been on this medication for at least 2 years now. -Start Hydroxyzine  50 mg TID PRN for anxiety/sleep -Discontinue Ambien 5 mg nightly for sleep-not a good choice of medications in the context of psychosis-reports auditory hallucinations prior to admission commanding in nature to kill self. -Discontinue Buspar  15 mg 3 times daily for anxiety-Lack of efficacy per patient  - - Discontinue insulin  per patient's request-educated on the need to continue this medication for  glycemic control, but refused, stating that he does not want insulin , is observed to be indulging in sugary snacks during snack time even though he is cognizant of the fact that this will elevate his blood glucose levels.    -Continue nicotine  patch for nicotine  dependence    Recommendations  -Education provided on healthy food choices as well as exercise after discharge. - Every 15 minute checks for safety  Donia Snell, NP 06/23/24  5:11 PM

## 2024-06-23 NOTE — ED Notes (Signed)
 [facility] called pts mother, Bobbette Ka to inform her that pt has been admitted to [facility]. [facility] left a HIPAA complaint message to return the call.   Chesley Holt, [facility]  06/23/24

## 2024-06-23 NOTE — ED Notes (Signed)
 Pt administered prn buspar . Pt displaying agitated and demanding behavior. Pt upset over food choices and serving amounts. Pt had two servings of cooking for snack.

## 2024-06-23 NOTE — ED Notes (Signed)
 New medication orders were reviewed with pt, pt says he will not take insulin . RN provided education about the potential consequences of uncontrolled blood glucose levels. Pt currently watching TV in dayroom, no signs of distress observed.

## 2024-06-23 NOTE — ED Notes (Signed)
 Pt presents as depressed, but cooperative. Pt ate breakfast including frosted flakes cereal, coffee, and peaches. Pt says he feels 'sad'. Pt denies si hi and avh- verbal contract for safety provided. Pt denies physical pain and discomforts, continues to decline insulin  r/t dx diabetes. Medications reviewed, pt agreed to prn tylenol  for c/o level 7/10 body aches.
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
## 2024-06-23 NOTE — ED Notes (Signed)
 Pt sleeping at this moment. No acute distress noted. Respirations are even and labored. Q15 safety checks in place.
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
## 2024-06-23 NOTE — ED Notes (Signed)
 Blood glucose prior to breakfast approx 8am- 175 mg/dL. Pt refuses insulin , says that he manages diabetes with 'drinking a lot of water' , denies oral diabetic medications. Provider notified.

## 2024-06-23 NOTE — ED Notes (Signed)
 Blood glucose check completed, 192mg /dL. Pt continues to refuse insulin .

## 2024-06-24 ENCOUNTER — Ambulatory Visit (HOSPITAL_COMMUNITY): Payer: Self-pay

## 2024-06-24 DIAGNOSIS — E119 Type 2 diabetes mellitus without complications: Secondary | ICD-10-CM | POA: Diagnosis not present

## 2024-06-24 DIAGNOSIS — F411 Generalized anxiety disorder: Secondary | ICD-10-CM | POA: Diagnosis not present

## 2024-06-24 DIAGNOSIS — R45851 Suicidal ideations: Secondary | ICD-10-CM | POA: Diagnosis not present

## 2024-06-24 DIAGNOSIS — F1594 Other stimulant use, unspecified with stimulant-induced mood disorder: Secondary | ICD-10-CM | POA: Diagnosis not present

## 2024-06-24 DIAGNOSIS — F332 Major depressive disorder, recurrent severe without psychotic features: Secondary | ICD-10-CM | POA: Diagnosis not present

## 2024-06-24 LAB — GLUCOSE, CAPILLARY: Glucose-Capillary: 181 mg/dL — ABNORMAL HIGH (ref 70–99)

## 2024-06-24 NOTE — ED Notes (Signed)
 Woke pt up to get CBG and first thing he said is he wanted food and that we don't have any. Pt has complained about food all shift. Getting upset that we only have snacks and no real food.

## 2024-06-24 NOTE — Care Management (Addendum)
 [facility] Care Management...  Writer met with patient...  Patient stated he would accept the [facility] [place] inpatient treatment.  Patient can discharge Friday 06/25/2024  Writer answered patient questions in regard to [facility] program and facility   8:30 am Writer spoke with Josh at [facility] to confirm acceptance.  Writer arranged transportation. Writer requested pick up by 11:00 am  Per Sidra, transportation will pick up patient 06/25/2024. Driver will regulatory affairs officer with estimated time of arrival    7 day supply and scripts

## 2024-06-24 NOTE — ED Notes (Signed)
 Pt denies SI/HI/AVH. Present in dayroom watching TV and interacting with peers. Snack given. Refused POCT check & insulin . States his goal for the week is to Get bad thoughts out of my head.
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
## 2024-06-24 NOTE — ED Notes (Signed)
 Pt is sleeping at this time, no acute distress noted. Respirations are even and unlabored. Q15 safety checks in place.

## 2024-06-24 NOTE — Discharge Instructions (Addendum)
 [facility] Care Management...  Patient has been accepted at [facility] spoke with Josh at [facility] to confirm acceptance.  Writer arranged transportation. Writer requested pick up by 11:00 am  Per [facility] [place], transportation will pick up patient 06/25/2024 at [address] [place].  Driver will regulatory affairs officer with estimated time of arrival    7 day supply and scripts

## 2024-06-24 NOTE — ED Notes (Signed)
 Pt sitting in dayroom watching television and interacting with peers. No acute distress noted. No concerns voiced. Informed pt to notify staff with any needs or assistance. Pt verbalized understanding and agreement. Will continue to monitor for safety.
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
## 2024-06-24 NOTE — ED Notes (Signed)
 Patient A&Ox4. Denies intent to harm self/others when asked. Denies A/VH. Patient denies any physical complaints when asked. No acute distress noted. Pt complained stating, I don't know why y'all keep checking my sugar. It don't matter how high it get, I'm not taking no insulin . I don't want to introduce my body to that. Writer attempted to explain rationale for checking blood sugar as sugar can affect the kidneys. Pt state, I don't care and I'll tell the doctor myself. I just want to be able to eat what everyone else eats. Encouraged pt to exercise more to help keep blood sugar more regulated. Pt walked away from nurses station laughing. Routine safety checks conducted according to facility protocol. Encouraged patient to notify staff if thoughts of harm toward self or others arise. Patient verbalize understanding and agreement. Will continue to monitor for safety.
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
## 2024-06-24 NOTE — ED Notes (Signed)
 Pt is sleeping, no acute distress noted.

## 2024-06-24 NOTE — ED Notes (Signed)
 Pt sitting in dayroom eating dinner, watching television and interacting with peers. No acute distress noted. No concerns voiced. Informed pt to notify staff with any needs or assistance. Pt verbalized understanding and agreement. Will continue to monitor for safety.

## 2024-06-25 DIAGNOSIS — F1594 Other stimulant use, unspecified with stimulant-induced mood disorder: Secondary | ICD-10-CM | POA: Diagnosis not present

## 2024-06-25 DIAGNOSIS — F411 Generalized anxiety disorder: Secondary | ICD-10-CM | POA: Diagnosis not present

## 2024-06-25 DIAGNOSIS — F332 Major depressive disorder, recurrent severe without psychotic features: Secondary | ICD-10-CM | POA: Diagnosis not present

## 2024-06-25 DIAGNOSIS — R45851 Suicidal ideations: Secondary | ICD-10-CM | POA: Diagnosis not present

## 2024-06-25 MED ORDER — ARIPIPRAZOLE 5 MG PO TABS: 5.0000 mg | ORAL_TABLET | Freq: Every day | ORAL | 0 refills | Status: AC

## 2024-06-25 MED ORDER — GLIPIZIDE ER 5 MG PO TB24: 5.0000 mg | ORAL_TABLET | Freq: Every day | ORAL | 0 refills | Status: AC

## 2024-06-25 MED ORDER — NICOTINE 21 MG/24HR TD PT24: 21.0000 mg | MEDICATED_PATCH | Freq: Every day | TRANSDERMAL | 0 refills | Status: AC

## 2024-06-25 MED ORDER — GABAPENTIN 100 MG PO CAPS
200.0000 mg | ORAL_CAPSULE | Freq: Three times a day (TID) | ORAL | 0 refills | Status: AC
Start: 2024-06-25 — End: ?

## 2024-06-25 NOTE — [facility] Group Notes (Signed)
 Spiritual Care and Counseling Group Note  06/25/2024 2:45pm  Facilitated by: Librada Donnice Lin   Type of Therapy and Topic:  Hope    Participation Level:  Active  Description of Group:  Group focused on topic of hope.  Patients participated in facilitated discussion around topic, connecting with one another around experiences and definitions for hope.  Group members engaged with group word cloud.  Members selected an image of what hope looks like for them today.  Group engaged in discussion around how their definitions of hope are present today in hospital.      Summary of Patient Progress:  Present throughout group until discharge    Therapeutic Modalities: Psycho-social ed, Adlerian, Narrative, MI   Librada Donnice Lin, Chaplain 06/25/2024 4:56 PM
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
## 2024-06-25 NOTE — ED Notes (Signed)
 Pt is sleeping, no acute distress noted. Q15 min safety checks continued.

## 2024-06-25 NOTE — Care Management (Signed)
 [facility] Care Management...  Writer met with patient  Writer confirmed that patient keeping to plan for inpatient at [facility] [place]   Patient discussed with clinical research associate his plans after Sports Coach received call from [employer] driver with estimated time of pick up.   Driver advised that due to multiple pick up add on's, She will arrive around 4 pm  Writer advised patient of pick up time
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
## 2024-08-03 ENCOUNTER — Other Ambulatory Visit: Payer: Self-pay
